# Patient Record
Sex: Female | Born: 1945 | Race: White | Hispanic: No | Marital: Married | Smoking: Former smoker
Health system: Southern US, Community
[De-identification: ages and names within clinical notes are randomized; demographics above are authoritative.]

## PROBLEM LIST (undated history)

## (undated) DIAGNOSIS — R011 Cardiac murmur, unspecified: Secondary | ICD-10-CM

## (undated) DIAGNOSIS — Z7289 Other problems related to lifestyle: Secondary | ICD-10-CM

## (undated) DIAGNOSIS — F419 Anxiety disorder, unspecified: Secondary | ICD-10-CM

## (undated) DIAGNOSIS — E785 Hyperlipidemia, unspecified: Secondary | ICD-10-CM

## (undated) DIAGNOSIS — R42 Dizziness and giddiness: Secondary | ICD-10-CM

## (undated) DIAGNOSIS — I480 Paroxysmal atrial fibrillation: Secondary | ICD-10-CM

## (undated) DIAGNOSIS — Z9289 Personal history of other medical treatment: Secondary | ICD-10-CM

## (undated) DIAGNOSIS — I10 Essential (primary) hypertension: Secondary | ICD-10-CM

## (undated) DIAGNOSIS — F1090 Alcohol use, unspecified, uncomplicated: Secondary | ICD-10-CM

## (undated) HISTORY — PX: TENDON REPAIR: SHX5111

## (undated) HISTORY — DX: Other problems related to lifestyle: Z72.89

## (undated) HISTORY — DX: Paroxysmal atrial fibrillation: I48.0

## (undated) HISTORY — DX: Personal history of other medical treatment: Z92.89

## (undated) HISTORY — PX: BREAST EXCISIONAL BIOPSY: SUR124

## (undated) HISTORY — PX: CYSTOSCOPY: SUR368

## (undated) HISTORY — DX: Alcohol use, unspecified, uncomplicated: F10.90

## (undated) HISTORY — PX: OTHER SURGICAL HISTORY: SHX169

## (undated) HISTORY — PX: ABDOMINAL HYSTERECTOMY: SHX81

---

## 1998-03-16 ENCOUNTER — Emergency Department (HOSPITAL_COMMUNITY): Admission: EM | Admit: 1998-03-16 | Discharge: 1998-03-16 | Payer: Self-pay | Admitting: Internal Medicine

## 1998-06-09 ENCOUNTER — Ambulatory Visit (HOSPITAL_COMMUNITY): Admission: RE | Admit: 1998-06-09 | Discharge: 1998-06-09 | Payer: Self-pay | Admitting: *Deleted

## 1998-08-05 ENCOUNTER — Ambulatory Visit (HOSPITAL_COMMUNITY): Admission: RE | Admit: 1998-08-05 | Discharge: 1998-08-05 | Payer: Self-pay | Admitting: *Deleted

## 1999-04-24 ENCOUNTER — Ambulatory Visit (HOSPITAL_COMMUNITY): Admission: RE | Admit: 1999-04-24 | Discharge: 1999-04-24 | Payer: Self-pay | Admitting: General Surgery

## 1999-04-24 ENCOUNTER — Encounter: Payer: Self-pay | Admitting: General Surgery

## 1999-06-29 ENCOUNTER — Other Ambulatory Visit: Admission: RE | Admit: 1999-06-29 | Discharge: 1999-06-29 | Payer: Self-pay | Admitting: *Deleted

## 2000-02-09 ENCOUNTER — Encounter: Admission: RE | Admit: 2000-02-09 | Discharge: 2000-02-09 | Payer: Self-pay | Admitting: General Surgery

## 2000-02-09 ENCOUNTER — Encounter: Payer: Self-pay | Admitting: General Surgery

## 2000-04-04 ENCOUNTER — Emergency Department (HOSPITAL_COMMUNITY): Admission: EM | Admit: 2000-04-04 | Discharge: 2000-04-04 | Payer: Self-pay | Admitting: Emergency Medicine

## 2000-04-04 ENCOUNTER — Encounter: Payer: Self-pay | Admitting: Internal Medicine

## 2000-07-01 ENCOUNTER — Other Ambulatory Visit: Admission: RE | Admit: 2000-07-01 | Discharge: 2000-07-01 | Payer: Self-pay | Admitting: *Deleted

## 2000-07-09 ENCOUNTER — Encounter: Admission: RE | Admit: 2000-07-09 | Discharge: 2000-07-09 | Payer: Self-pay | Admitting: *Deleted

## 2000-07-09 ENCOUNTER — Encounter: Payer: Self-pay | Admitting: *Deleted

## 2000-12-18 ENCOUNTER — Emergency Department (HOSPITAL_COMMUNITY): Admission: EM | Admit: 2000-12-18 | Discharge: 2000-12-18 | Payer: Self-pay | Admitting: Emergency Medicine

## 2000-12-30 ENCOUNTER — Encounter: Payer: Self-pay | Admitting: General Surgery

## 2000-12-30 ENCOUNTER — Ambulatory Visit (HOSPITAL_COMMUNITY): Admission: RE | Admit: 2000-12-30 | Discharge: 2000-12-30 | Payer: Self-pay | Admitting: General Surgery

## 2001-01-13 ENCOUNTER — Ambulatory Visit (HOSPITAL_COMMUNITY): Admission: RE | Admit: 2001-01-13 | Discharge: 2001-01-13 | Payer: Self-pay | Admitting: Neurosurgery

## 2001-01-13 ENCOUNTER — Encounter: Payer: Self-pay | Admitting: Neurosurgery

## 2001-02-12 ENCOUNTER — Encounter: Payer: Self-pay | Admitting: General Surgery

## 2001-02-12 ENCOUNTER — Ambulatory Visit (HOSPITAL_COMMUNITY): Admission: RE | Admit: 2001-02-12 | Discharge: 2001-02-12 | Payer: Self-pay | Admitting: General Surgery

## 2001-03-31 ENCOUNTER — Encounter (INDEPENDENT_AMBULATORY_CARE_PROVIDER_SITE_OTHER): Payer: Self-pay | Admitting: Specialist

## 2001-03-31 ENCOUNTER — Ambulatory Visit (HOSPITAL_COMMUNITY): Admission: RE | Admit: 2001-03-31 | Discharge: 2001-03-31 | Payer: Self-pay | Admitting: *Deleted

## 2001-06-11 ENCOUNTER — Encounter: Admission: RE | Admit: 2001-06-11 | Discharge: 2001-06-11 | Payer: Self-pay | Admitting: General Surgery

## 2001-06-11 ENCOUNTER — Encounter: Payer: Self-pay | Admitting: General Surgery

## 2001-06-30 ENCOUNTER — Other Ambulatory Visit: Admission: RE | Admit: 2001-06-30 | Discharge: 2001-06-30 | Payer: Self-pay | Admitting: *Deleted

## 2001-08-22 ENCOUNTER — Ambulatory Visit (HOSPITAL_COMMUNITY): Admission: RE | Admit: 2001-08-22 | Discharge: 2001-08-22 | Payer: Self-pay | Admitting: *Deleted

## 2002-06-22 ENCOUNTER — Other Ambulatory Visit: Admission: RE | Admit: 2002-06-22 | Discharge: 2002-06-22 | Payer: Self-pay | Admitting: *Deleted

## 2002-06-30 ENCOUNTER — Encounter: Admission: RE | Admit: 2002-06-30 | Discharge: 2002-06-30 | Payer: Self-pay | Admitting: General Surgery

## 2002-06-30 ENCOUNTER — Encounter: Payer: Self-pay | Admitting: General Surgery

## 2003-05-17 ENCOUNTER — Emergency Department (HOSPITAL_COMMUNITY): Admission: EM | Admit: 2003-05-17 | Discharge: 2003-05-17 | Payer: Self-pay | Admitting: Emergency Medicine

## 2003-05-17 ENCOUNTER — Encounter: Payer: Self-pay | Admitting: Emergency Medicine

## 2003-06-25 ENCOUNTER — Ambulatory Visit (HOSPITAL_COMMUNITY): Admission: RE | Admit: 2003-06-25 | Discharge: 2003-06-25 | Payer: Self-pay | Admitting: Urology

## 2003-07-01 ENCOUNTER — Ambulatory Visit (HOSPITAL_COMMUNITY): Admission: RE | Admit: 2003-07-01 | Discharge: 2003-07-01 | Payer: Self-pay | Admitting: Urology

## 2003-07-01 ENCOUNTER — Encounter: Payer: Self-pay | Admitting: Urology

## 2003-07-06 ENCOUNTER — Other Ambulatory Visit: Admission: RE | Admit: 2003-07-06 | Discharge: 2003-07-06 | Payer: Self-pay | Admitting: *Deleted

## 2003-07-23 ENCOUNTER — Encounter: Admission: RE | Admit: 2003-07-23 | Discharge: 2003-07-23 | Payer: Self-pay | Admitting: *Deleted

## 2003-07-23 ENCOUNTER — Encounter: Payer: Self-pay | Admitting: *Deleted

## 2003-09-25 IMAGING — CT CT PELVIS W/ CM
1 of 4 series · 14 of 32 positions shown, 19 images · IV contrast (omnipaque)
Comparison: none

FINDINGS
CLINICAL DATA: PELVIC PAIN.
CT SCAN OF THE ABDOMEN AND PELVIS WITH CONTRAST
CT SCAN OF THE ABDOMEN WITH CONTRAST:
MULTIPLE SPIRAL IMAGES WERE MADE THROUGH THE ABDOMEN AFTER INTRAVENOUS INJECTION OF 150 CC OF
OMNIPAQUE 300.  ORAL CONTRAST WAS ALSO USED.
THE LUNG BASES ARE NORMAL.  THERE IS NO ABNORMALITY OF THE LIVER, SPLEEN, OR PANCREAS.  THE KIDNEYS
AND RETROPERITONEAL STRUCTURES ARE NORMAL.  THE APPENDIX IS NORMAL.
IMPRESSION
NORMAL CT SCAN OF THE ABDOMEN WITH CONTRAST.
CT SCAN OF THE PELVIS WITH CONTRAST:
ADDITIONAL IMAGES THROUGH THE PELVIS AFTER ORAL AND INTRAVENOUS CONTRAST DEMONSTRATE NO OVARIAN
MASS.  THE UTERUS IS SLIGHTLY ENLARGED.  THERE IS NO INFLAMMATORY CHANGE OR ADENOPATHY.
NEGATIVE CT SCAN OF THE PELVIS WITH THE EXCEPTION OF A PROMINENT UTERUS.

[Series 2: abd/pelvis 5.0 b30f · axial · 0.59mm/px · z∈[-518,-148]mm · 14 of 84 slices shown, 19 images]
[im 5/84  soft-tissue]
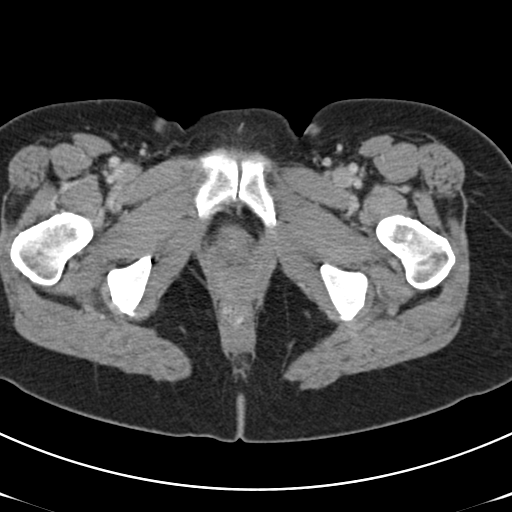
[im 5/84  bone]
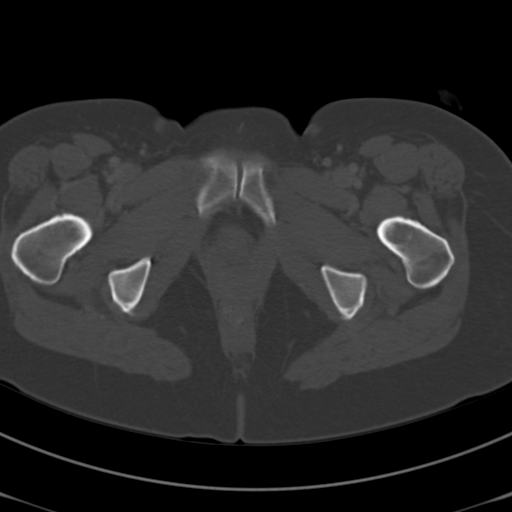
[im 10/84  soft-tissue]
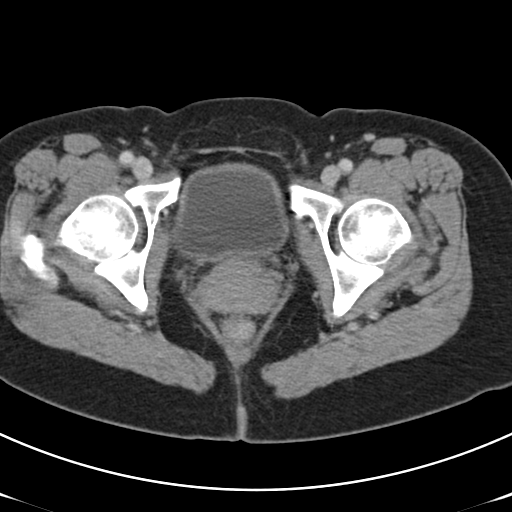
[im 19/84  soft-tissue]
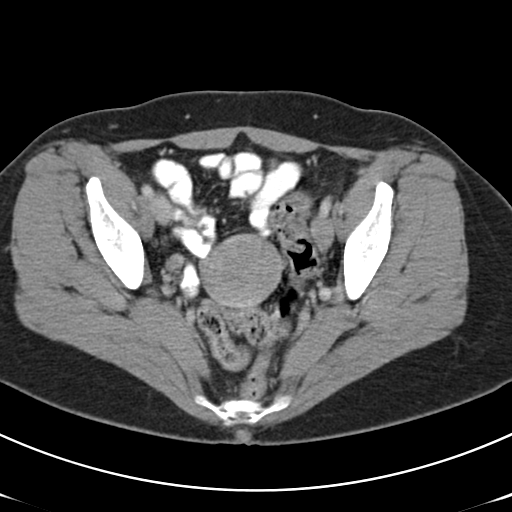
[im 24/84  soft-tissue]
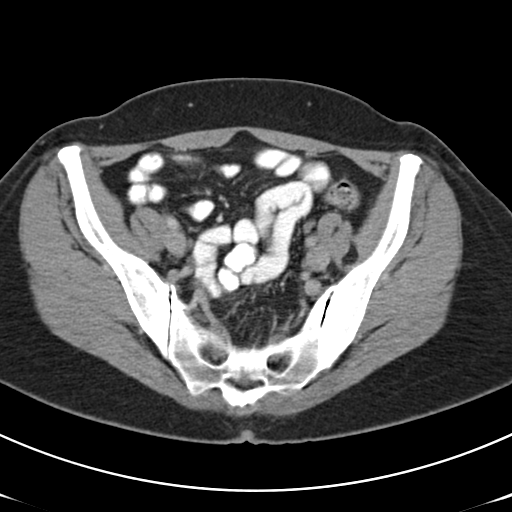
[im 28/84  soft-tissue]
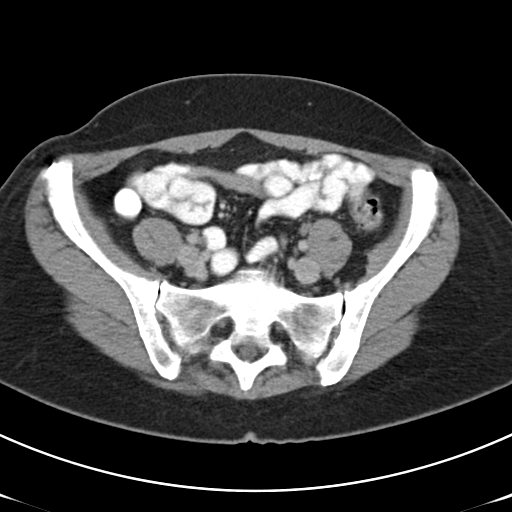
[im 37/84  soft-tissue]
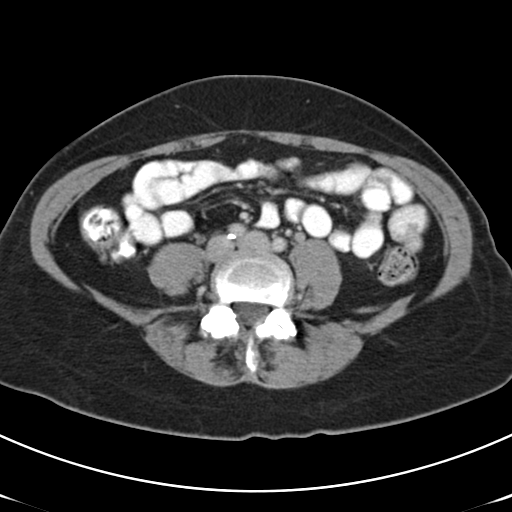
[im 42/84  soft-tissue]
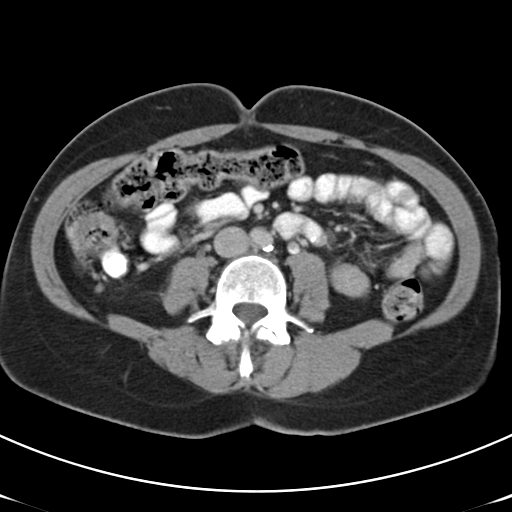
[im 47/84  soft-tissue]
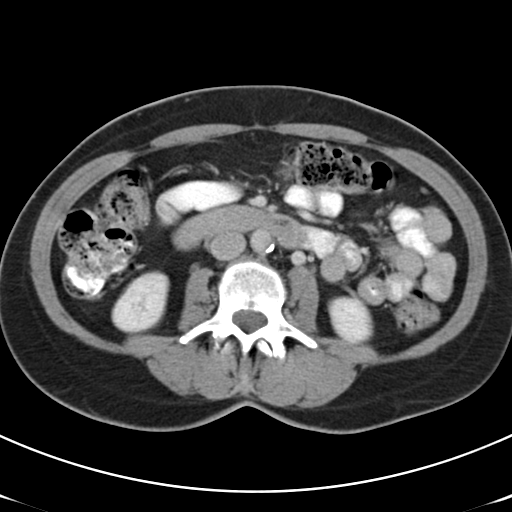
[im 56/84  soft-tissue]
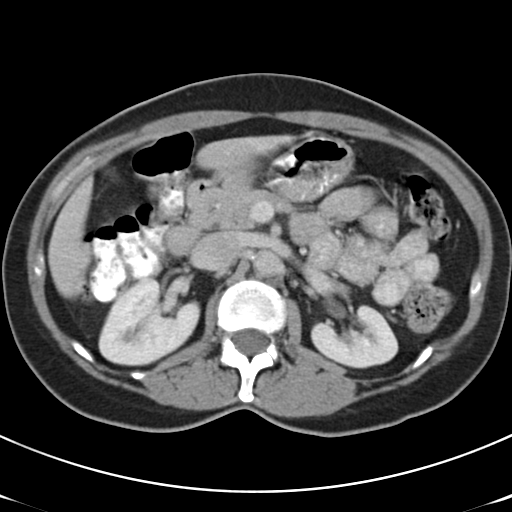
[im 56/84  bone]
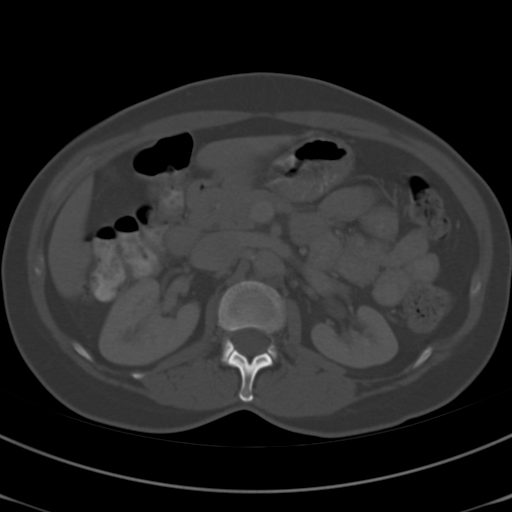
[im 60/84  soft-tissue]
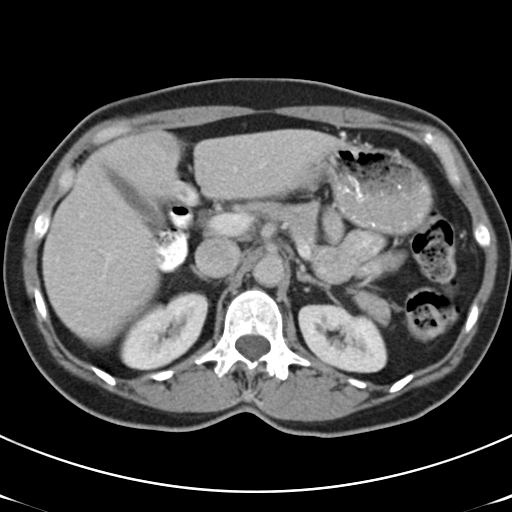
[im 65/84  soft-tissue]
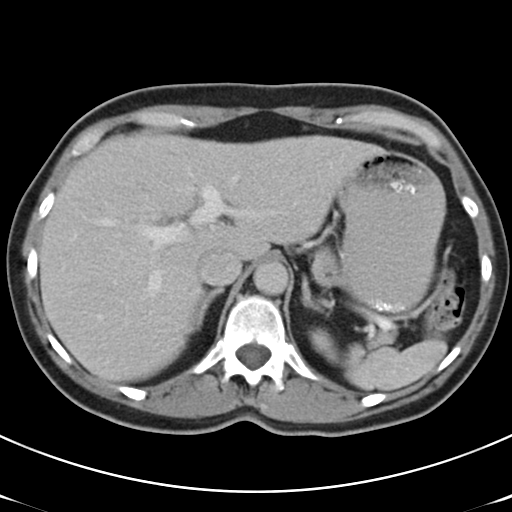
[im 65/84  lung]
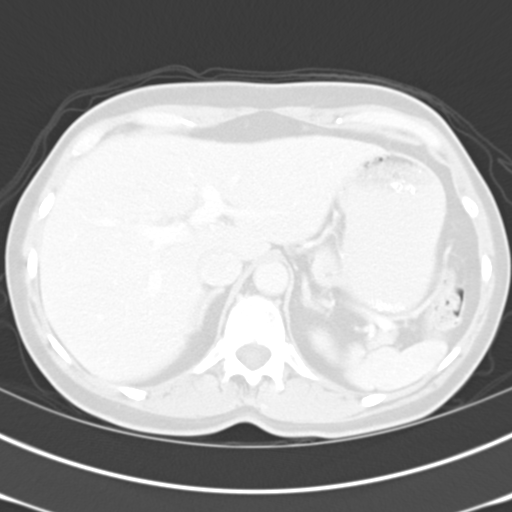
[im 70/84  lung]
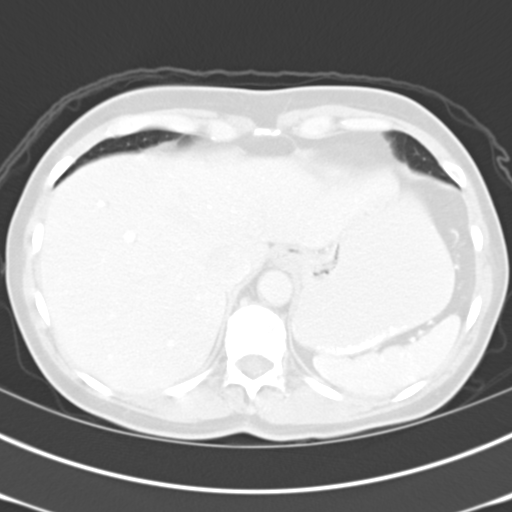
[im 74/84  soft-tissue]
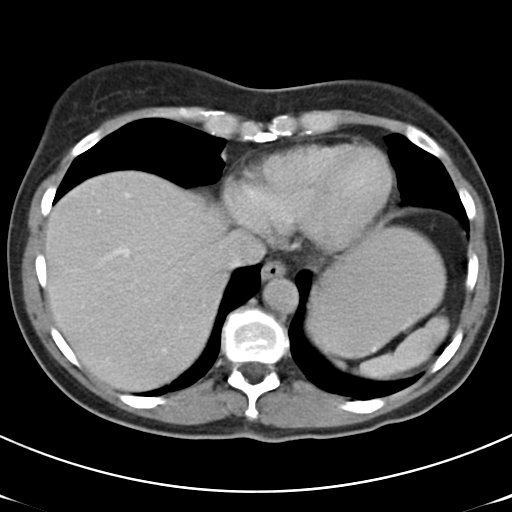
[im 74/84  lung]
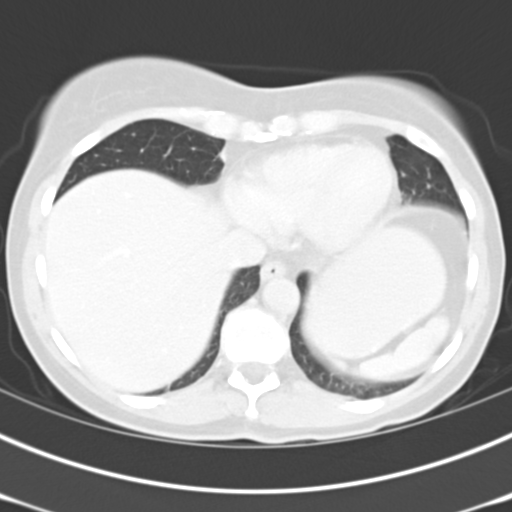
[im 79/84  soft-tissue]
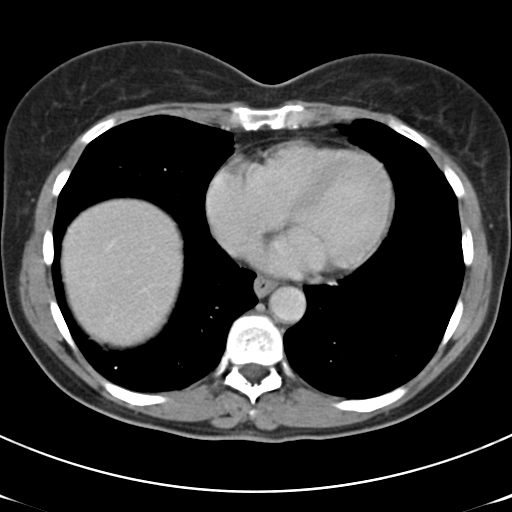
[im 79/84  lung]
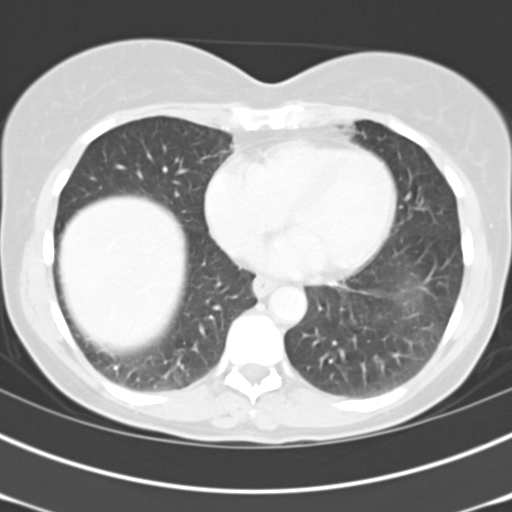

[14 of 32 positions shown; findings below may reference images not displayed]

## 2004-07-24 ENCOUNTER — Encounter: Admission: RE | Admit: 2004-07-24 | Discharge: 2004-07-24 | Payer: Self-pay | Admitting: *Deleted

## 2004-07-26 ENCOUNTER — Other Ambulatory Visit: Admission: RE | Admit: 2004-07-26 | Discharge: 2004-07-26 | Payer: Self-pay | Admitting: *Deleted

## 2004-10-16 ENCOUNTER — Ambulatory Visit (HOSPITAL_COMMUNITY): Admission: RE | Admit: 2004-10-16 | Discharge: 2004-10-16 | Payer: Self-pay | Admitting: Gastroenterology

## 2005-01-15 ENCOUNTER — Ambulatory Visit (HOSPITAL_COMMUNITY): Admission: RE | Admit: 2005-01-15 | Discharge: 2005-01-15 | Payer: Self-pay | Admitting: *Deleted

## 2005-01-31 ENCOUNTER — Encounter (INDEPENDENT_AMBULATORY_CARE_PROVIDER_SITE_OTHER): Payer: Self-pay | Admitting: Specialist

## 2005-01-31 ENCOUNTER — Observation Stay (HOSPITAL_COMMUNITY): Admission: RE | Admit: 2005-01-31 | Discharge: 2005-02-01 | Payer: Self-pay | Admitting: *Deleted

## 2005-07-25 ENCOUNTER — Encounter: Admission: RE | Admit: 2005-07-25 | Discharge: 2005-07-25 | Payer: Self-pay | Admitting: *Deleted

## 2005-07-30 ENCOUNTER — Other Ambulatory Visit: Admission: RE | Admit: 2005-07-30 | Discharge: 2005-07-30 | Payer: Self-pay | Admitting: *Deleted

## 2006-07-26 ENCOUNTER — Encounter: Admission: RE | Admit: 2006-07-26 | Discharge: 2006-07-26 | Payer: Self-pay | Admitting: *Deleted

## 2006-07-31 ENCOUNTER — Other Ambulatory Visit: Admission: RE | Admit: 2006-07-31 | Discharge: 2006-07-31 | Payer: Self-pay | Admitting: *Deleted

## 2006-08-22 ENCOUNTER — Encounter: Admission: RE | Admit: 2006-08-22 | Discharge: 2006-08-22 | Payer: Self-pay | Admitting: Family Medicine

## 2006-09-30 ENCOUNTER — Ambulatory Visit (HOSPITAL_COMMUNITY): Admission: RE | Admit: 2006-09-30 | Discharge: 2006-09-30 | Payer: Self-pay | Admitting: Urology

## 2006-10-20 IMAGING — MG MM SCREEN MAMMOGRAM BILATERAL
4 series · 4 of 4 positions shown · non-contrast
Comparison: none

DG SCREEN MAMMOGRAM BILATERAL
Bilateral CC and MLO view(s) were taken.

DIGITAL SCREENING MAMMOGRAM WITH CAD:
There is a fibroglandular pattern.  No masses or malignant type calcifications are identified.  
Compared with prior studies.

[R CC]
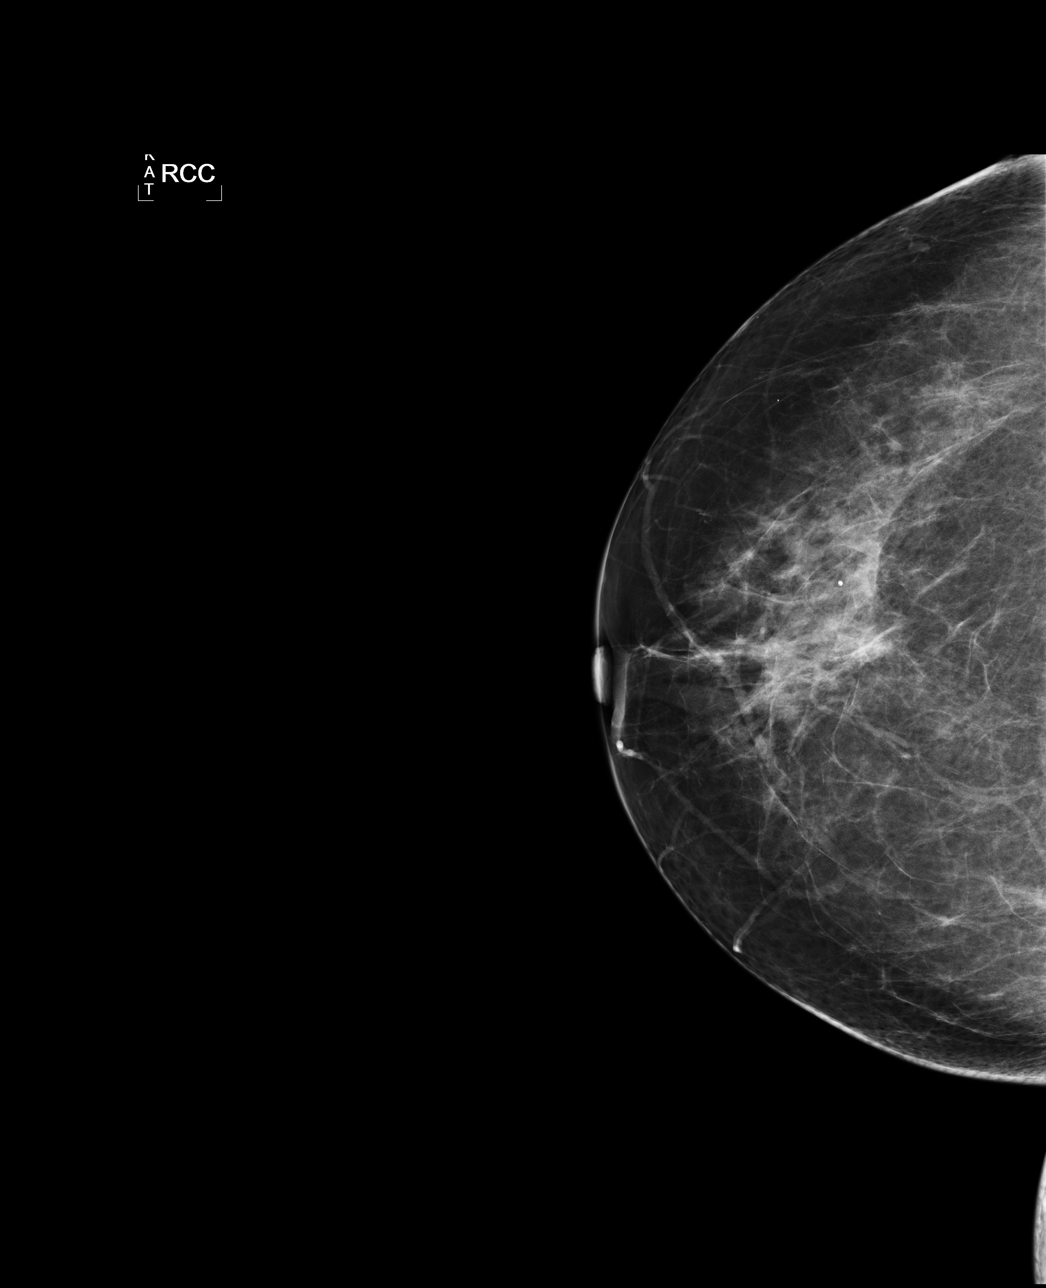

[L CC]
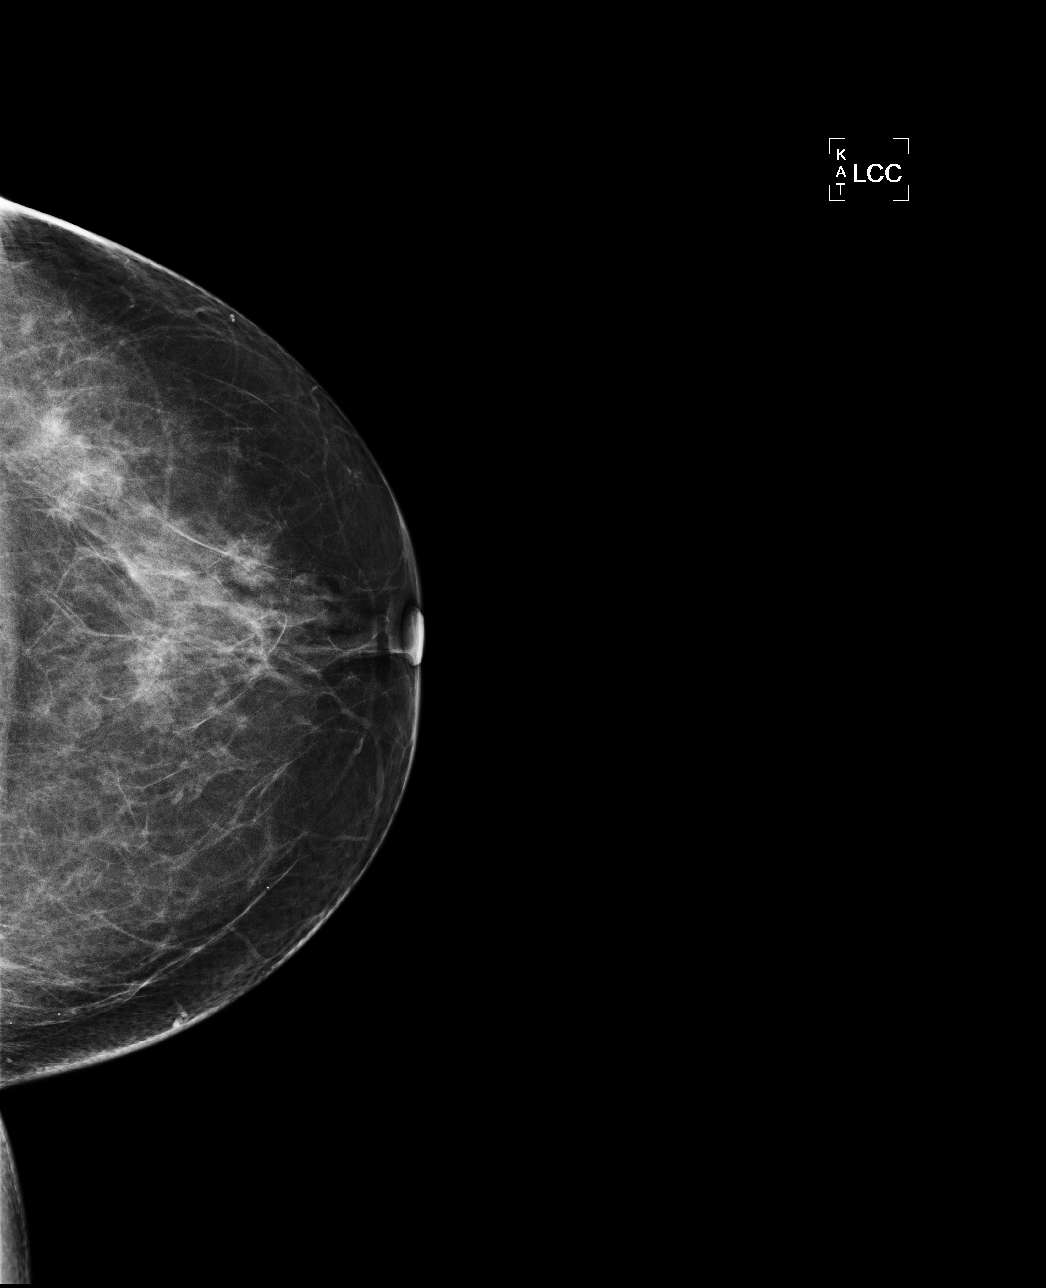

[L MLO]
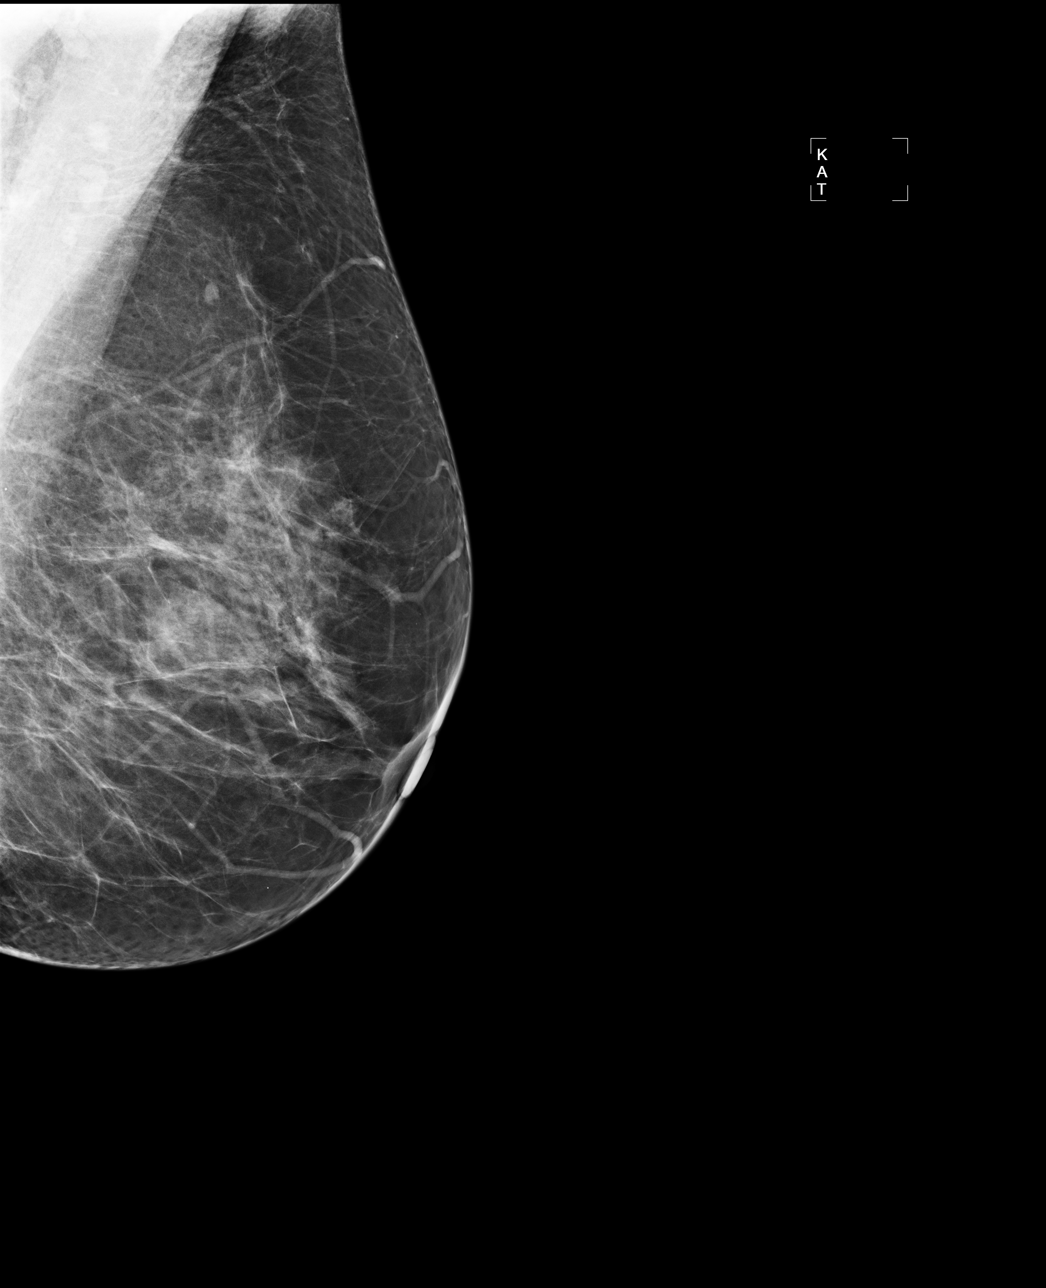

[R MLO]
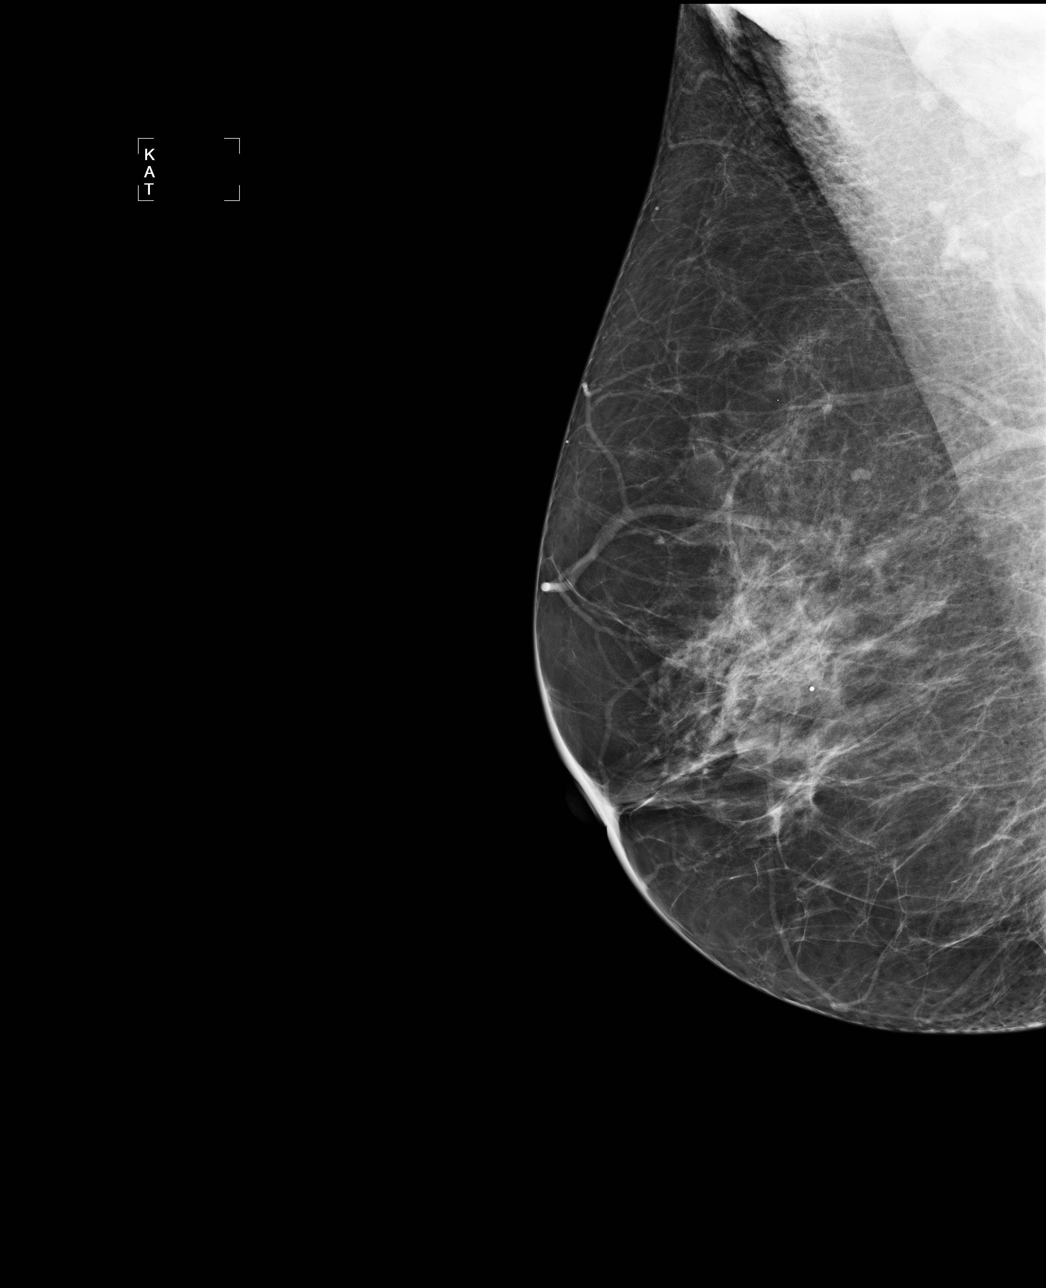

[4 of 4 positions shown; findings below may reference images not displayed]

IMPRESSION: No specific mammographic evidence of malignancy.  Next screening mammogram is recommended in one 
year.

ASSESSMENT: Negative - BI-RADS 1

Screening mammogram in 1 year.
ANALYZED BY COMPUTER AIDED DETECTION. , THIS PROCEDURE WAS A DIGITAL MAMMOGRAM.

## 2007-07-21 ENCOUNTER — Other Ambulatory Visit: Admission: RE | Admit: 2007-07-21 | Discharge: 2007-07-21 | Payer: Self-pay | Admitting: *Deleted

## 2007-08-08 ENCOUNTER — Encounter: Admission: RE | Admit: 2007-08-08 | Discharge: 2007-08-08 | Payer: Self-pay | Admitting: *Deleted

## 2008-08-09 ENCOUNTER — Encounter: Admission: RE | Admit: 2008-08-09 | Discharge: 2008-08-09 | Payer: Self-pay | Admitting: Family Medicine

## 2008-08-23 ENCOUNTER — Encounter: Admission: RE | Admit: 2008-08-23 | Discharge: 2008-08-23 | Payer: Self-pay | Admitting: Family Medicine

## 2009-08-16 ENCOUNTER — Encounter: Admission: RE | Admit: 2009-08-16 | Discharge: 2009-08-16 | Payer: Self-pay | Admitting: Family Medicine

## 2009-08-23 ENCOUNTER — Encounter: Admission: RE | Admit: 2009-08-23 | Discharge: 2009-08-23 | Payer: Self-pay | Admitting: Family Medicine

## 2010-03-31 ENCOUNTER — Encounter (INDEPENDENT_AMBULATORY_CARE_PROVIDER_SITE_OTHER): Payer: Self-pay | Admitting: Internal Medicine

## 2010-03-31 ENCOUNTER — Observation Stay (HOSPITAL_COMMUNITY): Admission: EM | Admit: 2010-03-31 | Discharge: 2010-04-02 | Payer: Self-pay | Admitting: Emergency Medicine

## 2010-03-31 ENCOUNTER — Ambulatory Visit: Payer: Self-pay | Admitting: Internal Medicine

## 2010-04-01 ENCOUNTER — Encounter (INDEPENDENT_AMBULATORY_CARE_PROVIDER_SITE_OTHER): Payer: Self-pay | Admitting: Internal Medicine

## 2010-04-06 ENCOUNTER — Encounter: Admission: RE | Admit: 2010-04-06 | Discharge: 2010-07-04 | Payer: Self-pay | Admitting: Family Medicine

## 2010-10-05 ENCOUNTER — Encounter
Admission: RE | Admit: 2010-10-05 | Discharge: 2010-10-05 | Payer: Self-pay | Source: Home / Self Care | Attending: Family Medicine | Admitting: Family Medicine

## 2010-10-29 ENCOUNTER — Encounter: Payer: Self-pay | Admitting: Family Medicine

## 2010-12-24 LAB — URINALYSIS, ROUTINE W REFLEX MICROSCOPIC
Ketones, ur: NEGATIVE mg/dL
Nitrite: NEGATIVE
Protein, ur: NEGATIVE mg/dL
Urobilinogen, UA: 1 mg/dL (ref 0.0–1.0)

## 2010-12-24 LAB — CBC
HCT: 39.9 % (ref 36.0–46.0)
Hemoglobin: 13.1 g/dL (ref 12.0–15.0)
MCH: 31.4 pg (ref 26.0–34.0)
MCHC: 32.8 g/dL (ref 30.0–36.0)
MCV: 93.8 fL (ref 78.0–100.0)
MCV: 94.4 fL (ref 78.0–100.0)
Platelets: 212 10*3/uL (ref 150–400)
RBC: 3.99 MIL/uL (ref 3.87–5.11)
WBC: 5.4 10*3/uL (ref 4.0–10.5)

## 2010-12-24 LAB — COMPREHENSIVE METABOLIC PANEL
ALT: 36 U/L — ABNORMAL HIGH (ref 0–35)
Albumin: 3.7 g/dL (ref 3.5–5.2)
Alkaline Phosphatase: 69 U/L (ref 39–117)
BUN: 20 mg/dL (ref 6–23)
BUN: 21 mg/dL (ref 6–23)
CO2: 25 mEq/L (ref 19–32)
CO2: 27 mEq/L (ref 19–32)
Chloride: 109 mEq/L (ref 96–112)
Creatinine, Ser: 0.78 mg/dL (ref 0.4–1.2)
GFR calc non Af Amer: >60 mL/min (ref >60)
GFR calc non Af Amer: >60 mL/min (ref >60)
Glucose, Bld: 120 mg/dL — ABNORMAL HIGH (ref 70–99)
Potassium: 4.2 mEq/L (ref 3.5–5.1)
Sodium: 139 mEq/L (ref 135–145)
Total Bilirubin: 1.2 mg/dL (ref 0.3–1.2)
Total Bilirubin: 1.5 mg/dL — ABNORMAL HIGH (ref 0.3–1.2)

## 2010-12-24 LAB — GLUCOSE, CAPILLARY
Glucose-Capillary: 104 mg/dL — ABNORMAL HIGH (ref 70–99)
Glucose-Capillary: 106 mg/dL — ABNORMAL HIGH (ref 70–99)

## 2010-12-24 LAB — APTT: aPTT: 34 seconds (ref 24–37)

## 2010-12-24 LAB — URINALYSIS, MICROSCOPIC ONLY
Bilirubin Urine: NEGATIVE
Glucose, UA: NEGATIVE mg/dL
Hgb urine dipstick: NEGATIVE
Nitrite: NEGATIVE
Specific Gravity, Urine: 1.014 (ref 1.005–1.030)
pH: 7 (ref 5.0–8.0)

## 2010-12-24 LAB — CARDIAC PANEL(CRET KIN+CKTOT+MB+TROPI): Relative Index: INVALID (ref 0.0–2.5)

## 2010-12-24 LAB — POCT CARDIAC MARKERS: Troponin i, poc: <0.05 ng/mL (ref 0.00–0.09)

## 2010-12-24 LAB — PROTIME-INR: INR: 0.98 (ref 0.00–1.49)

## 2010-12-24 LAB — LIPID PANEL
Cholesterol: 172 mg/dL (ref 0–200)
LDL Cholesterol: 81 mg/dL (ref 0–99)
VLDL: 23 mg/dL (ref 0–40)

## 2011-02-23 NOTE — Op Note (Signed)
Sheila Bruce, Sheila Bruce                ACCOUNT NO.:  [id number]   MEDICAL RECORD NO.:  [id number]          PATIENT TYPE:  AMB   LOCATION:  ENDO                         FACILITY:  [facility]   PHYSICIAN:  John C. Madilyn Fireman, M.D.    DATE OF BIRTH:  [birth year]   DATE OF PROCEDURE:  10/16/2004  DATE OF DISCHARGE:                                 OPERATIVE REPORT   PROCEDURE:  Colonoscopy.   INDICATION FOR PROCEDURE:  Average-risk colon cancer screening.   PROCEDURE:  The patient was placed in the left lateral decubitus position  and placed on the pulse monitor with continuous low-flow oxygen delivered by  nasal cannula.  She was sedated with 75 mcg IV fentanyl and 7 mg IV Versed.  The Olympus video colonoscope was inserted into the rectum and advanced to  the cecum, confirmed by transillumination of McBurney's point and  visualization of ileocecal valve and appendiceal orifice.  The prep was  excellent.  The cecum, ascending, transverse, descending and sigmoid colon  all appeared normal with no masses, polyps, diverticula or other mucosal  abnormalities.  The rectum likewise appeared normal, and retroflexed view of the anus  revealed no obvious internal hemorrhoids.  The scope was then withdrawn and  the patient returned to the recovery room in stable condition.  She  tolerated the procedure well, and there were no immediate complications.   IMPRESSION:  1.  Normal colonoscopy.   PLAN:  Next colonoscopy in 10 years with consideration of colon screening by  sigmoidoscopy or Hemoccults in 5 years.      JCH/MEDQ  D:  10/16/2004  T:  10/16/2004  Job:  [id number]   cc:   Delano Metz  CB# [phone] [address]  [place]  [place] [address]  Fax: [phone]

## 2011-02-23 NOTE — Op Note (Signed)
[facility]  Patient:    Sheila Bruce, Sheila Bruce                       MRN: [id number] Proc. Date: 03/31/01 Adm. Date:  [id number] Attending:  Collene Schlichter                           Operative Report  PREOPERATIVE DIAGNOSES:  Abnormal uterine bleeding, pelvic pain, uterine enlargement, persisting pelvic cyst.  POSTOPERATIVE DIAGNOSES:  Abnormal uterine bleeding, pelvic pain, uterine enlargement, persisting pelvic cyst, pelvic adhesions, probable endometriosis. Pathology pending.  OPERATION PERFORMED:  Diagnostic hysteroscopy, fractional D&C, laparoscopy with excision of right tubal cyst, lysis of adhesions, oblation of endometriosis.  ANESTHESIA:  General oral tracheal.  SURGEON:  Dr. Randell Patient.  INDICATIONS FOR PROCEDURE:  The patients a 65 year old with the above noted problems who was counselled as to the need for surgery to evaluate and treat these problems. She was fully counselled as to the nature of the procedure and the risks involved to include risks of anesthesia, injury to uterus, tubes, ovaries, bowel, bladder, blood vessels, postoperative hemorrhage, infection, and recuperation. She fully understands all these considerations and wishes to proceed on March 31, 2001.  INDICATIONS FOR OUTPATIENT PROCEDURE:  This procedure is commonly performed as an outpatient procedure.  OPERATIVE FINDINGS:  On hysteroscopy, the uterus sounded to approximately 10 cm. The endocervical canal was clean. Endometrial cavity had a moderate amount of shaggy appearing endometrium with polypoid area at the upper fundal surface. The submucosal area was somewhat irregular suggestive of small submucosal myomata. A laparoscopy of the liver, gallbladder, spleen, area of the appendix were normal to visualization. In the pelvis, the uterus was mid posterior, approximately 8-[redacted] weeks gestational size and soft. There was an adhesions involving the posterior fundal area to the  loop of small intestine which was below this area.  The right tube was previously surgically divided and had an approximately 3 cm cyst at the distal end with another small cyst present. The cyst contained gray-green fluid. The ovary on the right was small and appeared to be only minimally functional if at all. On the left side, the tube also had been surgically interrupted for sterilization attempt and the ovary was also small and appeared nonfunctional. There were areas of powder burn formation and abnormal discoloration in the posterior cul-de-sac particularly along the right uterosacral ligament.  DESCRIPTION OF PROCEDURE:  With the patient under general anesthesia, prepped and draped in the usual sterile fashion, with speculum in the vagina and tenaculum on the anterior lip of the cervix, a small sharp curette was used for curettage of the endocervical canal. The uterus was then sounded as noted above, and the cervix was dilated up to a #21 Pratt dilator. Medium sharp curette and polyp forceps were used to remove tissue from the endometrial cavity. When no further tissue was removed, the hysteroscope was reemployed to ensure that all tissue had been excised, and after noting that this was the case and that hemostasis was maintained, an acorn cannula was placed on the cervix. The patient was then catheterized with free flow of clear urine. She was then prepared and draped for a laparoscopy procedure.  An incision was then made in the lower pole of the umbilicus with insertion of the Veress cannula and installation of three liters of carbon dioxide. The reusable 10 mm trocar for the operative scope and the scope itself were  then inserted into the peritoneal cavity. A reusable 5 mm probe was inserted through a stab wound just above the symphysis pubis. The above noted findings were visualized. Bipolar electrocoagulation was used to render the area around the cyst hemostatic with  excision of the cyst. Prior to full removal of the cyst, the fluid was aspirated with no leakage into the peritoneal cavity. The cyst and distal portion of the right tube were then excised. The adhesions on the posterior surface of the uterus was rendered hemostatic with bipolar electrocoagulation and likewise excised. Small areas of endometriosis on the posterior fundus were destroyed using bipolar electrocoagulation. The area was then lavaged with copious amounts of lactated Ringers solution, and after noting that hemostasis was maintained and that sponge and instrument counts were correct following a reduction of intra-abdominal pressure by allowing gas to escape, the instruments were removed from the peritoneal cavity. Gas was allowed to fully escaped, and the incisions were closed with fascial sutures of #0 Vicryl and subcuticular sutures of 3-0 plain catgut. Estimated blood loss less than 25 ml. The patient was taken to the recovery room in good condition follow catheterization and free flow of clear urine.  FOLLOW-UP CARE:  She will be discharged from recovery with stable vital signs and is to return to the office in approximately two weeks for follow-up. She was instructed to gradually progress her activities over several days and to avoid vaginal penetration for two weeks. She is to call for heavy bleeding, pain or unexplained fever and will be placed on a regular diet. She will be discharged in good condition. She was given a prescription for Darvocet-N 100 #15 to be taken 1 or 2 q. 4h p.r.n. pain and ampicillin 500 mg #16 to be taken 1 q.i.d. as continuation for her mitral valve prolapse prophylaxis. DD:  03/31/01 TD:  03/31/01 Job: [id number]

## 2011-02-23 NOTE — Discharge Summary (Signed)
NAMEBOWEN, GOYAL                ACCOUNT NO.:  [id number]   MEDICAL RECORD NO.:  [id number]          PATIENT TYPE:  OBV   LOCATION:  0440                         FACILITY:  [facility]   PHYSICIAN:  Almedia Balls. Fore, M.D.   DATE OF BIRTH:  [birth year]   DATE OF ADMISSION:  01/31/2005  DATE OF DISCHARGE:  02/01/2005                                 DISCHARGE SUMMARY   HISTORY:  The patient is a 65 year old with abnormal uterine bleeding  postmenopausally, uterine enlargement, pelvic pain, stress urinary  incontinence, and rectocele for surgery on January 31, 2005. The remainder of  her history and physical are as previously dictated.   LABORATORY DATA:  Include preoperative hemoglobin 13 g. Chest x-ray showed  minimal COPD. Electrocardiogram was within normal limits.   HOSPITAL COURSE:  The patient was taken to the operating room on April 26 at  which time TAH/BSO, Moschowitz enterocele repair, Burch cystourethropexy,  and posterior colporrhaphy were performed. The patient's diet and ambulation  were progressed over the evening of April 26 and the morning of April 27.  She did well postoperatively. On the morning of April 27 she was afebrile  and was instructed in the care of her suprapubic catheter. It was felt that  she could be discharged with this in place to care for it at home and to  return when necessary.   FINAL DIAGNOSES:  1.  Abnormal uterine bleeding.  2.  Uterine enlargement.  3.  Pelvic pain.  4.  Stress urinary incontinence.  5.  Rectocele.   OPERATION:  TAH/BSO, Moschowitz enterocele repair, Burch cystourethropexy,  posterior colporrhaphy, placement of a suprapubic catheter. Pathology report  unavailable at the time of dictation.   DISPOSITION:  Discharged home to return to the office for catheter removal  when voiding with residual less than 60 mL x2 during the day. She is  otherwise to return in 2 weeks for follow-up. She is instructed to gradually  progress her  activities over several weeks at home and to limit lifting and  driving for 2 weeks. She was fully ambulatory, on a regular diet, and in  good condition at the time of discharge. She was given a prescription for  Dilaudid generic 2 mg #30 to be taken one to two q.4-6h. p.r.n. pain and  Keflex 250 mg capsules #24 to be taken one q.i.d.      SRF/MEDQ  D:  02/01/2005  T:  02/01/2005  Job:  [id number]

## 2011-02-23 NOTE — H&P (Signed)
Sheila Bruce, Sheila Bruce                ACCOUNT NO.:  [id number]   MEDICAL RECORD NO.:  [id number]           PATIENT TYPE:   LOCATION:                                 FACILITY:   PHYSICIAN:  Almedia Balls. Fore, M.D.        DATE OF BIRTH:   DATE OF ADMISSION:  01/31/2005  DATE OF DISCHARGE:                                HISTORY & PHYSICAL   CHIEF COMPLAINT:  Uterine enlargement, pain, abnormal bleeding, stress  incontinence, cystocele, rectocele.   PRESENT ILLNESS:  The patient is a 65 year old who has been followed in our  office over several years.  Sequential CT scans of her abdomen and pelvis  showed enlargement of her uterus with multiple myomata present.  The patient  is having recurring lower abdominal pain felt to be secondary to these  structures, as well as possible adhesions.  She is admitted at this time for  TAH/BSO Burch cystourethropexy posterior repair.  She has been fully  counseled as to the nature of the procedure and the risks involved to  include risks of anesthesia, injury to bowel, bladder, blood vessels,  ureters, postoperative hemorrhage, infection, recuperation, and use of  hormone replacement should her ovaries be removed.  She fully understands  all these considerations and wishes to proceed on January 31, 2005.   PAST MEDICAL HISTORY:  1.  Hysteroscopy D&C and laparoscopy in 2002.  2.  Hysteroscopy D&C in October 2003 with benign tissue being obtained.  3.  The patient underwent endometrial biopsy in early April 2006 with benign      tissue found.   MEDICATIONS:  1.  Flonase for respiratory problems.  2.  Albuterol for respiratory problems.  3.  Uses Vivelle patch.  4.  Prometrium for hormone replacement.   ALLERGIES:  1.  CODEINE.  2.  CIPRO.  3.  NONSTEROIDAL ANTI-INFLAMMATORY MEDICATIONS.   FAMILY HISTORY:  Sister with a bipolar disorder and diabetes.  Paternal  grandmother with breast cancer.  Father with prostate cancer.  A number of  grandparents  and parents with cardiovascular disease.   REVIEW OF SYSTEMS:  HEENT:  Has allergies, probably related to sinus  problems.  CARDIORESPIRATORY:  Negative, except as related to allergies.  GASTROINTESTINAL:  Negative.  GENITOURINARY:  As in present illness.  NEUROMUSCULAR:  Negative.   PHYSICAL EXAMINATION:  VITAL SIGNS:  Height 5 feet 0.5 inch.  Weight 120  pounds.  Blood pressure 122/76, pulse 80, respirations 18.  GENERAL:  Well-developed white female in no acute distress.  HEENT:  Within normal limits.  NECK:  Supple without masses, adenopathy, or bruits.  HEART:  Regular rate and rhythm without murmurs.  LUNGS:  Clear to percussion and auscultation.  BREASTS:  Breasts examined sitting and lying, without mass.  Axilla  negative.  ABDOMEN:  Flat and soft without mass or tenderness.  PELVIC:  External genitalia, Bartholin's urethra, Skene's glands within  normal limits.  Cervix is slightly inflamed.  Uterus is mid position,  approximately 12 plus weeks gestational size, irregular.  Tender to  palpation.  Adnexal exam revealed no  palpable masses, but tenderness  bilaterally.  There is noted to be a moderate cystocele and rectocele with  possible enterocele present, and tenderness in the posterior cul-de-sac.  EXTREMITIES:  Within normal limits.  Central nervous system grossly intact.  SKIN:  Without suspicious lesions.   IMPRESSION:  1.  Uterine enlargement.  2.  Pelvic pain.  3.  Stress urinary incontinence with cystocele, rectocele, and possible      enterocele.   DISPOSITION:  As noted above.   Of note is that a Pap smear was normal in October of 2005.       ___________________________________________  Almedia Balls. Randell Patient, M.D.    SRF/MEDQ  D:  01/18/2005  T:  01/18/2005  Job:  [id number]

## 2011-02-23 NOTE — Op Note (Signed)
NAMELADAVIA, Sheila Bruce                ACCOUNT NO.:  [id number]   MEDICAL RECORD NO.:  [id number]          PATIENT TYPE:  AMB   LOCATION:  DAY                          FACILITY:  [facility]   PHYSICIAN:  Almedia Balls. Fore, M.D.   DATE OF BIRTH:  1946-09-27   DATE OF PROCEDURE:  01/31/2005  DATE OF DISCHARGE:                                 OPERATIVE REPORT   PREOPERATIVE DIAGNOSES:  1.  Uterine enlargement.  2.  Abnormal uterine bleeding.  3.  Pelvic pain.  4.  Stress urinary incontinence.  5.  Rectocele.   POSTOPERATIVE DIAGNOSES:  1.  Uterine enlargement.  2.  Abnormal uterine bleeding.  3.  Pelvic pain.  4.  Stress urinary incontinence.  5.  Rectocele.  6.  Pending pathology.  7.  Enterocele.   OPERATIONS:  1.  Total abdominal hysterectomy.  2.  Bilateral salpingo-oophorectomy.  3.  Moschowitz enterocele repair.  4.  Burch cystourethropexy.  5.  Posterior colporrhaphy.   OPERATOR:  Almedia Balls. Randell Patient, M.D.   FIRST ASSISTANT:  Leona Singleton, M.D.   INDICATION FOR SURGERY:  The patient is a 65 year old with the above-noted  problems, who has been counseled as to the need for surgery to repair these  problems and the type of surgery to be performed.  She has been fully  counseled as to the nature of the procedure and the risks involved to  include risks of anesthesia, injury to bowel, bladder, blood vessels,  ureters, postoperative hemorrhage, infection, recuperation and continuation  of hormone replacement following removal of the ovaries.  She fully  understands all of these considerations and wishes to proceed on January 31, 2005.   OPERATIVE FINDINGS:  On entry into the peritoneal cavity, the uterus was  enlarged to approximately [redacted] weeks gestational size.  Both ovaries appeared  normal.  There was a slight to moderate cystocele and moderate rectocele  present on bimanual exam later.   DESCRIPTION OF PROCEDURE:  With the patient under general anesthesia,  prepared and draped  in the usual sterile fashion in the dorsolithotomy  position and with a Foley catheter in the bladder, a lower abdominal  transverse incision was made and carried into the peritoneal cavity without  difficulty.  Exploration of the upper abdomen revealed all viscera to be  normal.  A self-retaining retractor was placed and the bowel was packed off.  Kelly clamps were used to clamp the utero-ovarian anastomoses, tubes and  round ligaments bilaterally for retraction and hemostasis.  The round  ligaments were then transected using Bovie electrocautery with development  of a bladder flap anteriorly and entry into the retroperitoneal space.  The  bladder was advanced over the lower uterine segment and cervix without  difficulty.  The infundibular pelvic ligaments bilaterally were isolated,  clamped, cut and doubly ligated with one chromic catgut.  The uterine  vessels bilaterally were skeletonized, clamped, cut and suture ligated with  1 chromic catgut.  Cardinal ligaments bilaterally were likewise clamped, cut  and suture ligated with 1 chromic catgut.  It was them possible to excess  the uterus and cervix using Bovie coagulation to dissect the tissue off of  the cervix.  Vaginal cuff angles were then rendered hemostatic with  interrupted sutures of 1 chromic catgut.  The vaginal cuff was  reapproximated and rendered hemostatic with a continuous interlocking suture  of 1 chromic catgut.  The area was lavaged with copious amounts of lactated  Ringer's solution.  After noting that hemostasis was maintained in the  surgical area, a suture of 0 Prolene was placed to close off the pelvis by  incorporating the round ligaments, anterior peritoneum and posterior  peritoneum to prevent and repair enterocele.  With good hemostasis and a  correct sponge and instrument count, the peritoneum was closed with a  continuous suture of 0 Vicryl.   The area at the space of Retzius was then opened up using blunt  dissection.  The surgeon placed another glove on his left hand and inserted this into the  vagina with elevation of the urethra with the Foley catheter delineating the  urethra and a Foley bulb delineating the lower portion of the trigone of the  bladder.  Interrupted sutures were placed through the vaginal mucosa and the  Cooper's ligaments bilaterally for elevation of the paravesical and vaginal  areas.  This effectively reestablished the ureterovesical angle.  These  sutures were then died down.  A suprapubic catheter was then placed just  above the symphysis pubis and placed into the bladder without difficulty.  A  Penrose drain was placed into the space of Retzius and exited through a stab  wound just in the right lower quadrant.  The area was lavaged again.  After  noting that hemostasis was maintained, the fascia was reapproximated with  two sutures of 0 Vicryl which were brought from the lateral aspects of the  incision and tied separately in the midline.  Subcutaneous fat was  reapproximated with interrupted sutures of 0 Vicryl.  The skin was closed  with a subcuticular suture of 3-0 plain catgut.   The patient was then repositioned for posterior colporrhaphy.  Allis clamps  were placed at the mucocutaneous junction at the introitus with another  Allis in the midline on the vaginal mucosa posteriorly.  A solution of 1%  lidocaine with 1:100,000 epinephrine was injected in the midline for a total  of approximately 5 mL.  An incision was made in the midline of the vaginal  mucosa and carried up to approximately the vaginal cuff area.  Underlying  levator ani fascia was dissected free of the vaginal mucosa using sharp and  blunt dissection bilaterally.  The rectocele was reduced and the levator ani  fascia was brought together in the midline with vertical and transverse sutures of 0 Vicryl which were imbricating in nature.  Redundant vaginal  mucosa was then trimmed and the vaginal  mucosa was reapproximated and  rendered hemostatic with a continuous interlocking suture of 2-0 chromic  catgut.  At this point, with good hemostasis and clear urine draining from  the Foley catheter tubing, the procedure was  terminated.  Estimated blood loss 150 mL.  The patient was taken to the  recovery room in good condition with clear urine in the Foley catheter  tubing.  The tubing was attached to the suprapubic catheter, which was  draining well as well.  The patient will be placed on 23-hour observation  following surgery.      SRF/MEDQ  D:  01/31/2005  T:  01/31/2005  Job:  [id number]  cc:   Leona Singleton, M.D.  [address]  [place]  [place] [address]  Fax: [phone]

## 2011-09-05 ENCOUNTER — Other Ambulatory Visit: Payer: Self-pay | Admitting: Family Medicine

## 2011-09-05 DIAGNOSIS — Z1231 Encounter for screening mammogram for malignant neoplasm of breast: Secondary | ICD-10-CM

## 2011-10-03 ENCOUNTER — Ambulatory Visit
Admission: RE | Admit: 2011-10-03 | Discharge: 2011-10-03 | Disposition: A | Discharge status: Home / Self Care | Payer: 59 | Source: Ambulatory Visit | Attending: Family Medicine | Admitting: Family Medicine

## 2011-10-03 DIAGNOSIS — Z1231 Encounter for screening mammogram for malignant neoplasm of breast: Secondary | ICD-10-CM

## 2011-10-08 ENCOUNTER — Ambulatory Visit: Payer: Self-pay

## 2012-08-26 ENCOUNTER — Other Ambulatory Visit: Payer: Self-pay | Admitting: Family Medicine

## 2012-08-26 DIAGNOSIS — Z1231 Encounter for screening mammogram for malignant neoplasm of breast: Secondary | ICD-10-CM

## 2012-10-03 ENCOUNTER — Ambulatory Visit
Admission: RE | Admit: 2012-10-03 | Discharge: 2012-10-03 | Disposition: A | Discharge status: Home / Self Care | Payer: 59 | Source: Ambulatory Visit | Attending: Family Medicine | Admitting: Family Medicine

## 2012-10-03 DIAGNOSIS — Z1231 Encounter for screening mammogram for malignant neoplasm of breast: Secondary | ICD-10-CM

## 2012-10-13 ENCOUNTER — Other Ambulatory Visit: Payer: Self-pay | Admitting: Family Medicine

## 2012-10-13 DIAGNOSIS — R928 Other abnormal and inconclusive findings on diagnostic imaging of breast: Secondary | ICD-10-CM

## 2012-10-21 ENCOUNTER — Ambulatory Visit
Admission: RE | Admit: 2012-10-21 | Discharge: 2012-10-21 | Disposition: A | Discharge status: Home / Self Care | Payer: 59 | Source: Ambulatory Visit | Attending: Family Medicine | Admitting: Family Medicine

## 2012-10-21 DIAGNOSIS — R928 Other abnormal and inconclusive findings on diagnostic imaging of breast: Secondary | ICD-10-CM

## 2013-01-12 ENCOUNTER — Inpatient Hospital Stay (HOSPITAL_COMMUNITY): Payer: 59

## 2013-01-12 ENCOUNTER — Inpatient Hospital Stay (HOSPITAL_COMMUNITY)
Admission: EM | Admit: 2013-01-12 | Discharge: 2013-01-13 | DRG: 310 | Disposition: A | Discharge status: Home / Self Care | Payer: 59 | Attending: Internal Medicine | Admitting: Internal Medicine

## 2013-01-12 ENCOUNTER — Encounter (HOSPITAL_COMMUNITY): Payer: Self-pay | Admitting: *Deleted

## 2013-01-12 DIAGNOSIS — I4891 Unspecified atrial fibrillation: Principal | ICD-10-CM | POA: Diagnosis present

## 2013-01-12 DIAGNOSIS — I517 Cardiomegaly: Secondary | ICD-10-CM

## 2013-01-12 DIAGNOSIS — I10 Essential (primary) hypertension: Secondary | ICD-10-CM | POA: Diagnosis present

## 2013-01-12 HISTORY — DX: Hyperlipidemia, unspecified: E78.5

## 2013-01-12 HISTORY — DX: Dizziness and giddiness: R42

## 2013-01-12 HISTORY — DX: Essential (primary) hypertension: I10

## 2013-01-12 HISTORY — DX: Anxiety disorder, unspecified: F41.9

## 2013-01-12 HISTORY — DX: Cardiac murmur, unspecified: R01.1

## 2013-01-12 LAB — CBC
Hemoglobin: 15.3 g/dL — ABNORMAL HIGH (ref 12.0–15.0)
MCH: 31.9 pg (ref 26.0–34.0)
MCHC: 33.8 g/dL (ref 30.0–36.0)
Platelets: 235 10*3/uL (ref 150–400)
RBC: 4.8 MIL/uL (ref 3.87–5.11)

## 2013-01-12 LAB — CBC WITH DIFFERENTIAL/PLATELET
Eosinophils Absolute: 0.1 10*3/uL (ref 0.0–0.7)
Lymphs Abs: 1.3 10*3/uL (ref 0.7–4.0)
MCH: 31.2 pg (ref 26.0–34.0)
Neutrophils Relative %: 80 % — ABNORMAL HIGH (ref 43–77)
Platelets: 247 10*3/uL (ref 150–400)
RBC: 4.45 MIL/uL (ref 3.87–5.11)
WBC: 10.3 10*3/uL (ref 4.0–10.5)

## 2013-01-12 LAB — PROTIME-INR: Prothrombin Time: 12.5 seconds (ref 11.6–15.2)

## 2013-01-12 LAB — APTT: aPTT: 40 seconds — ABNORMAL HIGH (ref 24–37)

## 2013-01-12 LAB — COMPREHENSIVE METABOLIC PANEL
ALT: 41 U/L — ABNORMAL HIGH (ref 0–35)
ALT: 37 U/L — ABNORMAL HIGH (ref 0–35)
AST: 32 U/L (ref 0–37)
Alkaline Phosphatase: 71 U/L (ref 39–117)
BUN: 16 mg/dL (ref 6–23)
Calcium: 9.2 mg/dL (ref 8.4–10.5)
Calcium: 8.8 mg/dL (ref 8.4–10.5)
Creatinine, Ser: 0.72 mg/dL (ref 0.50–1.10)
GFR calc Af Amer: >90 mL/min (ref >90)
GFR calc Af Amer: >90 mL/min (ref >90)
Glucose, Bld: 102 mg/dL — ABNORMAL HIGH (ref 70–99)
Glucose, Bld: 95 mg/dL (ref 70–99)
Potassium: 3.9 mEq/L (ref 3.5–5.1)
Sodium: 140 mEq/L (ref 135–145)
Sodium: 138 mEq/L (ref 135–145)
Total Protein: 6.8 g/dL (ref 6.0–8.3)
Total Protein: 6.5 g/dL (ref 6.0–8.3)

## 2013-01-12 LAB — MAGNESIUM: Magnesium: 1.8 mg/dL (ref 1.5–2.5)

## 2013-01-12 LAB — PHOSPHORUS: Phosphorus: 3.4 mg/dL (ref 2.3–4.6)

## 2013-01-12 LAB — RAPID URINE DRUG SCREEN, HOSP PERFORMED
Amphetamines: NOT DETECTED
Tetrahydrocannabinol: NOT DETECTED

## 2013-01-12 MED ORDER — SODIUM CHLORIDE 0.9 % IV SOLN
INTRAVENOUS | Status: DC
  Administered 2013-01-12: 08:00:00 via INTRAVENOUS

## 2013-01-12 MED ORDER — ACETAMINOPHEN 325 MG PO TABS: 650.0000 mg | ORAL_TABLET | Freq: Four times a day (QID) | ORAL | Status: DC | PRN

## 2013-01-12 MED ORDER — ATORVASTATIN CALCIUM 20 MG PO TABS
20.0000 mg | ORAL_TABLET | Freq: Every day | ORAL | Status: DC
  Administered 2013-01-12: 20 mg via ORAL
  Filled 2013-01-12 (×2): qty 1

## 2013-01-12 MED ORDER — SODIUM CHLORIDE 0.9 % IJ SOLN
3.0000 mL | Freq: Two times a day (BID) | INTRAMUSCULAR | Status: DC
  Administered 2013-01-12 – 2013-01-13 (×3): 3 mL via INTRAVENOUS

## 2013-01-12 MED ORDER — LOSARTAN POTASSIUM 50 MG PO TABS
50.0000 mg | ORAL_TABLET | Freq: Every day | ORAL | Status: DC
  Administered 2013-01-12 – 2013-01-13 (×2): 50 mg via ORAL
  Filled 2013-01-12 (×2): qty 1

## 2013-01-12 MED ORDER — SODIUM CHLORIDE 0.9 % IV SOLN
INTRAVENOUS | Status: AC
  Administered 2013-01-12: 11:00:00 via INTRAVENOUS

## 2013-01-12 MED ORDER — SIMVASTATIN 40 MG PO TABS
40.0000 mg | ORAL_TABLET | Freq: Every morning | ORAL | Status: DC
  Filled 2013-01-12: qty 1

## 2013-01-12 MED ORDER — ONDANSETRON HCL 4 MG/2ML IJ SOLN: 4.0000 mg | Freq: Four times a day (QID) | INTRAMUSCULAR | Status: DC | PRN

## 2013-01-12 MED ORDER — HYDROCODONE-ACETAMINOPHEN 5-325 MG PO TABS: 1.0000 | ORAL_TABLET | ORAL | Status: DC | PRN

## 2013-01-12 MED ORDER — DEXTROSE 5 % IV SOLN: 10.0000 mg/h | INTRAVENOUS | Status: DC

## 2013-01-12 MED ORDER — ASPIRIN 81 MG PO CHEW
81.0000 mg | CHEWABLE_TABLET | Freq: Every day | ORAL | Status: DC
  Administered 2013-01-12 – 2013-01-13 (×2): 81 mg via ORAL
  Filled 2013-01-12 (×2): qty 1

## 2013-01-12 MED ORDER — DILTIAZEM HCL 100 MG IV SOLR
10.0000 mg/h | INTRAVENOUS | Status: DC
  Administered 2013-01-12: 10 mg/h via INTRAVENOUS

## 2013-01-12 MED ORDER — LORATADINE 10 MG PO TABS
10.0000 mg | ORAL_TABLET | Freq: Every day | ORAL | Status: DC
  Administered 2013-01-12 – 2013-01-13 (×2): 10 mg via ORAL
  Filled 2013-01-12 (×2): qty 1

## 2013-01-12 MED ORDER — NICOTINE POLACRILEX 2 MG MT GUM
2.0000 mg | CHEWING_GUM | OROMUCOSAL | Status: DC | PRN
  Filled 2013-01-12: qty 1

## 2013-01-12 MED ORDER — DILTIAZEM LOAD VIA INFUSION
20.0000 mg | Freq: Once | INTRAVENOUS | Status: DC
  Filled 2013-01-12: qty 20

## 2013-01-12 MED ORDER — ONDANSETRON HCL 4 MG PO TABS: 4.0000 mg | ORAL_TABLET | Freq: Four times a day (QID) | ORAL | Status: DC | PRN

## 2013-01-12 MED ORDER — ACETAMINOPHEN 650 MG RE SUPP: 650.0000 mg | Freq: Four times a day (QID) | RECTAL | Status: DC | PRN

## 2013-01-12 MED ORDER — DILTIAZEM HCL ER COATED BEADS 120 MG PO CP24
120.0000 mg | ORAL_CAPSULE | Freq: Every day | ORAL | Status: DC
  Administered 2013-01-12 – 2013-01-13 (×2): 120 mg via ORAL
  Filled 2013-01-12 (×2): qty 1

## 2013-01-12 MED ORDER — ENOXAPARIN SODIUM 30 MG/0.3ML ~~LOC~~ SOLN
30.0000 mg | Freq: Every day | SUBCUTANEOUS | Status: DC
  Administered 2013-01-12: 30 mg via SUBCUTANEOUS
  Filled 2013-01-12 (×2): qty 0.3

## 2013-01-12 MED ORDER — DILTIAZEM LOAD VIA INFUSION
20.0000 mg | Freq: Once | INTRAVENOUS | Status: AC
Start: 2013-01-12 — End: 2013-01-12
  Administered 2013-01-12: 20 mg via INTRAVENOUS
  Filled 2013-01-12: qty 20

## 2013-01-12 MED ORDER — DILTIAZEM HCL ER COATED BEADS 120 MG PO CP24: 60.0000 mg | ORAL_CAPSULE | Freq: Every day | ORAL | Status: DC

## 2013-01-12 MED ORDER — METOPROLOL TARTRATE 25 MG PO TABS
25.0000 mg | ORAL_TABLET | Freq: Two times a day (BID) | ORAL | Status: DC
  Filled 2013-01-12 (×2): qty 1

## 2013-01-12 NOTE — Progress Notes (Signed)
Benefit check in progress for the co pay for the following medication - Eliquis, Xarelto and PradaxaAbelino Derrick RN,BSN,MHA [phone]

## 2013-01-12 NOTE — ED Notes (Signed)
Sheila Bruce, Consulting civil engineer at bedside.

## 2013-01-12 NOTE — Evaluation (Addendum)
Physical Therapy One Time Evaluation Patient Details Name: Sheila Bruce MRN: [id number] DOB: 12/01/1945 Today's Date: 01/12/2013 Time: 4098-1191 PT Time Calculation (min): 9 min  PT Assessment / Plan / Recommendation Clinical Impression  Pt admitted for palpitations and SOB.  Pt currently NSR on cardizem IV and ambulated in hallway with HR staying around 85 bpm and SaO2 94% room air.  Pt denies SOB with activity and appears at her baseline in regards to mobility.  No PT needs identified at this time.    PT Assessment  Patent does not need any further PT services    Follow Up Recommendations  No PT follow up    Does the patient have the potential to tolerate intense rehabilitation      Barriers to Discharge        Equipment Recommendations  None recommended by PT    Recommendations for Other Services     Frequency      Precautions / Restrictions Precautions Precautions: None   Pertinent Vitals/Pain See above      Mobility  Bed Mobility Bed Mobility: Supine to Sit;Sit to Supine Supine to Sit: 7: Independent Sit to Supine: 7: Independent Transfers Transfers: Sit to Stand;Stand to Sit Sit to Stand: 7: Independent Stand to Sit: 7: Independent Ambulation/Gait Ambulation/Gait Assistance: 7: Independent Ambulation Distance (Feet): 200 Feet Assistive device: None Ambulation/Gait Assistance Details: occasionally pushed IV pole, no unsteady gait or LOB, HR stayed around 85 bpm and SaO2 94% during ambulation, denies SOB Gait Pattern: Within Functional Limits    Exercises     PT Diagnosis:    PT Problem List:   PT Treatment Interventions:     PT Goals    Visit Information  Last PT Received On: 01/12/13 Assistance Needed: +1    Subjective Data  Subjective: Yeah, I'm on my feet all day.     Prior Functioning  Home Living Home Adaptive Equipment: None Prior Function Level of Independence: Independent Able to Take Stairs?: Yes Vocation: Full time  employment Comments: works in urology OR at [employer] Communication: No difficulties    Cognition  Cognition Overall Cognitive Status: Appears within functional limits for tasks assessed/performed Arousal/Alertness: Awake/alert Orientation Level: Appears intact for tasks assessed Behavior During Session: [facility] for tasks performed    Extremity/Trunk Assessment Right Upper Extremity Assessment RUE ROM/Strength/Tone: [facility] for tasks assessed Left Upper Extremity Assessment LUE ROM/Strength/Tone: [facility] for tasks assessed Right Lower Extremity Assessment RLE ROM/Strength/Tone: [facility] for tasks assessed Left Lower Extremity Assessment LLE ROM/Strength/Tone: [facility] for tasks assessed Trunk Assessment Trunk Assessment: Normal   Balance    End of Session PT - End of Session Activity Tolerance: Patient tolerated treatment well Patient left: in bed;Other (comment) (with ECHO) Nurse Communication: Mobility status  GP     Damany Eastman,KATHrine E 01/12/2013, 2:58 PM Zenovia Jarred, PT, DPT 01/12/2013 Pager: [phone]

## 2013-01-12 NOTE — ED Notes (Signed)
Pt transferred to 1428 accompanied by family and Renita, RN on cardiac monitor with chart and personal belongings. Condition stable at time of transfer.

## 2013-01-12 NOTE — Consult Note (Signed)
CARDIOLOGY CONSULT NOTE  Patient ID: Sheila Bruce, MRN: [id number], DOB/AGE: 1946-07-22 67 y.o. Admit date: 01/12/2013   Date of Consult: 01/12/2013 Primary Physician: No primary provider on file. Primary Cardiologist: New to [facility]  Chief Complaint:increased HR Reason for Consult: afib  HPI: Sheila Bruce is a 67 y/o F with history of HTN, HL, former 35 yrs tobacco abuse, frequent EtOH use and no significant cardiac history who we are asked to see for new onset of atrial fibrillation. She has a history of echo done for an isolated episode of vertigo 03/2010 with EF 60-65%, grade 2 diastolic dysfunction, mild MR, mild-mod TR. She was in her usual state of health yesterday and went to bed as usual. This morning when she awoke, she felt as though she was having a panic attack with racing heart, mild dyspnea and chest pressure. 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 She was given a 20mg  cardizem bolus then gtt and spontaneously converted to NSR in the 60's. She denies current CP/SOB but has a headache and still feels shaky. She does not exercise but denies any exertional CP or SOB. No fevers, chills, cough, nausea, vomiting. She drinks 1 cup of coffee daily. She does not believe she's ever had any episodes like this before, although in retrospect now she's not sure if some of her prior panic attacks were accompanied by arrhythmias (however - these panic attacks were always appropriate to a concerning situation, whereas this morning's episode was not precipitated by anything). She does report a significant amount of stress with family and work. In the ER, EtOH neg, UDS neg, glu 102, ALT 41, otherwise no acute abnormalities. CXR with mild peribronchial thickening which may relate to chronic bronchitis/smoking.  Past Medical History  Diagnosis Date  . Hypertension   . Heart murmur   . Vertigo     . Hyperlipidemia   . Anxiety       Most Recent Cardiac Studies: 2D 03/2010 Study Conclusions - done for eval for dizziness - Left ventricle: The cavity size was normal. There was mild   concentric hypertrophy. Systolic function was normal. The   estimated ejection fraction was in the range of 60% to 65%.   Features are consistent with a pseudonormal left ventricular   filling pattern, with concomitant abnormal relaxation and   increased filling pressure (grade 2 diastolic dysfunction). - Mitral valve: Mild regurgitation. - Atrial septum: There was increased thickness of the septum,   consistent with lipomatous hypertrophy. No defect or patent   foramen ovale was identified. - Tricuspid valve: Mild-moderate regurgitation.  03/2010 Carotid Dopplers Right: mild calcific plaque origin ICA. Left: minimal plaque noted. Bilateral: no ICA stenosis. Vertebral artery flow is antegrade.   Surgical History:  Past Surgical History  Procedure Laterality Date  . Abdominal hysterectomy    . Cystoscopy    . Laproscopy-abdominal    . Tendon repair      Left hand     Home Meds: Prior to Admission medications   Medication Sig Start Date End Date Taking? Authorizing Provider  aspirin 81 MG tablet Take 81 mg by mouth daily.   Yes Historical Provider, MD  estrogens, conjugated, (PREMARIN) 0.3 MG tablet Take 0.3 mg by mouth.   Yes Historical Provider, MD  loratadine (CLARITIN) 10 MG tablet Take 10 mg by mouth daily.   Yes  Historical Provider, MD  LOSARTAN POTASSIUM PO Take 50 mg by mouth daily.    Yes Historical Provider, MD  simvastatin (ZOCOR) 40 MG tablet Take 40 mg by mouth every morning.   Yes Historical Provider, MD    Inpatient Medications:  . sodium chloride   Intravenous STAT  . aspirin  81 mg Oral Daily  . diltiazem  120 mg Oral Daily  . enoxaparin (LOVENOX) injection  30 mg Subcutaneous Daily  . loratadine  10 mg Oral Daily  . losartan  50 mg Oral Daily  . metoprolol tartrate  25  mg Oral BID  . simvastatin  40 mg Oral q morning - 10a  . sodium chloride  3 mL Intravenous Q12H    Allergies:  Allergies  Allergen Reactions  . Ciprocinonide (Fluocinolone) Rash  . Codeine Itching  . Ciprofloxacin   . Doxycycline Nausea And Vomiting    History   Social History  . Marital Status: Married    Spouse Name: N/A    Number of Children: N/A  . Years of Education: N/A   Occupational History  . Not on file.   Social History Main Topics  . Smoking status: Former Smoker    Quit date: 01/12/2005  . Smokeless tobacco: Never Used     Comment: Smoked for 35 yrs  . Alcohol Use: 1.2 oz/week    2 Glasses of wine per week     Comment: Daily, 2 glasses up to 1 bottle nightly.  . Drug Use: No  . Sexually Active: No   Other Topics Concern  . Not on file   Social History Narrative  . No narrative on file     Family History  Problem Relation Age of Onset  . Hypertension      Mother & father  . Hyperlipidemia      Mother & father  . Autoimmune disease      Mother (?liver) and sister, type unclear  . CAD      father had old MI on EKG  . CAD      multiple grandparents     Review of Systems: General: negative for chills, fever, night sweats. Lost ~5lbs intentionally for lent  Cardiovascular: see above. No orthopnea, PND, LEE. Dermatological: negative for rash Respiratory: negative for cough or wheezing Urologic: negative for hematuria Abdominal: negative for nausea, vomiting, diarrhea, bright red blood per rectum, melena, or hematemesis Neurologic: negative for visual changes, syncope, or dizziness All other systems reviewed and are otherwise negative except as noted above.  Labs:  Recent Labs  01/12/13 0900  TROPONINI <0.30   Lab Results  Component Value Date   WBC 7.0 01/12/2013   HGB 15.3* 01/12/2013   HCT 45.3 01/12/2013   MCV 94.4 01/12/2013   PLT 235 01/12/2013     Recent Labs Lab 01/12/13 0900  NA 140  K 3.9  CL 103  CO2 23  BUN 18   CREATININE 0.71  CALCIUM 9.2  PROT 6.8  BILITOT 0.6  ALKPHOS 71  ALT 41*  AST 32  GLUCOSE 102*     Radiology/Studies: CXR: IMPRESSION:1. No acute cardiopulmonary disease. 2. Mild peribronchial thickening which may relate to chronic bronchitis or smoking.  EKG:  Initial 01/12/13 at 7:35: Atrial fibrillation RVR 150bpm, borderline ST depression diffuse leads (baseline wander present) Repeat 01/12/13 at 11:32: NSR no acute ST-T changes (mild ST upsloping V4-V6)  Physical Exam: Blood pressure 116/62, pulse 61, temperature 97.7 F (36.5 C), temperature source Oral, resp. rate 16,  height 5' (1.524 m), weight 133 lb 13.1 oz (60.7 kg), SpO2 99.00%. General: Well developed, well nourished WF in no acute distress. Head: Normocephalic, atraumatic, sclera non-icteric, no xanthomas, nares are without discharge.  Neck: Negative for carotid bruits. JVD not elevated. Lungs: Clear bilaterally to auscultation without wheezes, rales, or rhonchi. Breathing is unlabored. Heart: RRR with S1 S2. Soft sem RUSB. No rubs or gallops appreciated. Abdomen: Soft, non-tender, non-distended with normoactive bowel sounds. No hepatomegaly. No rebound/guarding. No obvious abdominal masses. Msk:  Strength and tone appear normal for age. Extremities: No clubbing or cyanosis. No edema.  Distal pedal pulses are 2+ and equal bilaterally. Neuro: Alert and oriented X 3. No facial asymmetry. No focal deficit. Moves all extremities spontaneously. Psych:  Responds to questions appropriately with a normal affect.   Assessment and Plan:   1. Atrial fibrillation with RVR, first recognized episode 2. HTN 3. HL 4. EtOH abuse 5. Former tobacco abuse  Signed, Ronie Spies PA-C 01/12/2013, 2:38 PM   Patient seen, examined. Available data reviewed. Agree with findings, assessment, and plan as outlined by Ronie Spies, PA-C. The patient presented with sudden onset of tachypalpitations and has been diagnosed with AF with RVR. She was  started on IV cardizem and converted back to sinus. Exam reveals an age-appropriate woman in NAD. JVP is normal, lungs clear, heart regular without murmur or gallop, and no peripheral edema. EKG #1 shows AF with RVR at a rate of 150 bpm and nonspecific ST depression. EKG #2 shows NSR and is within normal limits. Labs reviewed and there are no significant abnormalities. CHADS2-Vasc = 2 and will = 3 later this year when she turns 67. Therefore anticoagulation is recommended (CHADS-Vasc >= 2).  Plan:  Convert from IV to PO diltiazem CD 120 mg daily first dose this afternoon  Stop Etoh (she was counseled regarding risk of recurrent AF)  Await 2D Echo result  Start oral anticoagulation with a NOAC. We have requested a Case Manager consult to see which drug will be affordable on her insurance coverage. We discussed risks/benefits/alternatives of NOAC's versus warfarin and she understands, would prefer NOAC.  Continue Losartan for BP and hopefully addition of cardizem CD will help improve control of HTN.  Tonny Bollman, M.D. 01/12/2013 2:55 PM

## 2013-01-12 NOTE — ED Notes (Signed)
Pt from home with reports of increased heart rate upon awakening this morning at 0630. Pt endorses hx of same but did not last this long. Pt also reports "a little" pressure in central chest radiating to left neck that started around 0700 but pain to neck as subsided. Pt also reports shortness of breath. MD at bedside presently.

## 2013-01-12 NOTE — ED Notes (Signed)
Spoke with Dr. Elisabeth Pigeon, okay for pt to go to telemetry, agrees pt does not need SDU, remains in NSR at present, IV Cardizem infusing at 5mg /hr, will monitor.

## 2013-01-12 NOTE — ED Notes (Signed)
Patient transported to X-ray 

## 2013-01-12 NOTE — ED Notes (Addendum)
Cardiology PA at bedside, aware that pt remains on IV Cardizem at 5mg /hr and HR remains NSR around 60.

## 2013-01-12 NOTE — Progress Notes (Signed)
  Echocardiogram 2D Echocardiogram has been performed.  Sheila Bruce 01/12/2013, 3:24 PM

## 2013-01-12 NOTE — ED Provider Notes (Addendum)
History     CSN: [id number]  Arrival date & time 01/12/13  4098   First MD Initiated Contact with Patient 01/12/13 0740      Chief Complaint  Patient presents with  . Palpitations  . Shortness of Breath    (Consider location/radiation/quality/duration/timing/severity/associated sxs/prior treatment) Patient is a 67 y.o. female presenting with palpitations and shortness of breath. The history is provided by the patient.  Palpitations  Associated symptoms include shortness of breath. Pertinent negatives include no fever, no chest pain, no abdominal pain, no nausea, no vomiting, no headaches, no back pain and no cough.  Shortness of Breath Associated symptoms: no abdominal pain, no chest pain, no cough, no fever, no headaches, no neck pain, no rash and no vomiting   pt with hx episodes palpitations intermittently over past few years, c/o palpitations onset this morning. Hr has remained fast. Mild bubbly sensation lower sternal area, no other current or recent cp or discomfort. Mild sob. No nausea/vomiting, or diaphoresis. No fever or chills. No heat intolerance, sweats or wt loss. No hx thyroid disease. Occasional caffeine use. No energy drink use. Moderate drinker, states drank several glasses wine last evening. No hx cad or dysrhythmia. Has had holter in past without specific dx. Recent health at baseline.     Past Medical History  Diagnosis Date  . Hypertension   . Heart murmur   . Vertigo     Past Surgical History  Procedure Laterality Date  . Abdominal hysterectomy    . Cystoscopy    . Laproscopy-abdominal    . Tendon repair      Left hand    History reviewed. No pertinent family history.  History  Substance Use Topics  . Smoking status: Former Smoker    Quit date: 01/12/2005  . Smokeless tobacco: Never Used  . Alcohol Use: 1.2 oz/week    2 Glasses of wine per week     Comment: Daily    OB History   Grav Para Term Preterm Abortions TAB SAB Ect Mult Living                   Review of Systems  Constitutional: Negative for fever and chills.  HENT: Negative for neck pain.   Eyes: Negative for redness.  Respiratory: Positive for shortness of breath. Negative for cough.   Cardiovascular: Positive for palpitations. Negative for chest pain and leg swelling.  Gastrointestinal: Negative for nausea, vomiting and abdominal pain.  Genitourinary: Negative for flank pain.  Musculoskeletal: Negative for back pain.  Skin: Negative for rash.  Neurological: Negative for headaches.  Hematological: Does not bruise/bleed easily.  Psychiatric/Behavioral: Negative for confusion.    Allergies  Ciprocinonide; Codeine; and Doxycycline  Home Medications  No current outpatient prescriptions on file.  BP 176/101  Temp(Src) 98.8 F (37.1 C) (Oral)  Resp 18  SpO2 98%  Physical Exam  Nursing note and vitals reviewed. Constitutional: She is oriented to person, place, and time. She appears well-developed and well-nourished. No distress.  HENT:  Nose: Nose normal.  Mouth/Throat: Oropharynx is clear and moist.  Eyes: Conjunctivae are normal. Pupils are equal, round, and reactive to light. No scleral icterus.  Neck: Normal range of motion. Neck supple. No tracheal deviation present. No thyromegaly present.  Cardiovascular: Normal heart sounds and intact distal pulses.   Tachycardic, irregular.   Pulmonary/Chest: Effort normal and breath sounds normal. No respiratory distress.  Abdominal: Soft. Normal appearance. She exhibits no distension. There is no tenderness.  Musculoskeletal: She exhibits  no edema and no tenderness.  Neurological: She is alert and oriented to person, place, and time.  Skin: Skin is warm and dry. No rash noted.  Psychiatric: She has a normal mood and affect.    ED Course  Procedures (including critical care time)  Results for orders placed during the hospital encounter of 01/12/13  CBC      Result Value Range   WBC 7.0  4.0 - 10.5 K/uL    RBC 4.80  3.87 - 5.11 MIL/uL   Hemoglobin 15.3 (*) 12.0 - 15.0 g/dL   HCT 16.1  09.6 - 04.5 %   MCV 94.4  78.0 - 100.0 fL   MCH 31.9  26.0 - 34.0 pg   MCHC 33.8  30.0 - 36.0 g/dL   RDW 40.9  81.1 - 91.4 %   Platelets 235  150 - 400 K/uL  COMPREHENSIVE METABOLIC PANEL      Result Value Range   Sodium 140  135 - 145 mEq/L   Potassium 3.9  3.5 - 5.1 mEq/L   Chloride 103  96 - 112 mEq/L   CO2 23  19 - 32 mEq/L   Glucose, Bld 102 (*) 70 - 99 mg/dL   BUN 18  6 - 23 mg/dL   Creatinine, Ser 7.82  0.50 - 1.10 mg/dL   Calcium 9.2  8.4 - 95.6 mg/dL   Total Protein 6.8  6.0 - 8.3 g/dL   Albumin 3.6  3.5 - 5.2 g/dL   AST 32  0 - 37 U/L   ALT 41 (*) 0 - 35 U/L   Alkaline Phosphatase 71  39 - 117 U/L   Total Bilirubin 0.6  0.3 - 1.2 mg/dL   GFR calc non Af Amer 89 (*) >90 mL/min   GFR calc Af Amer >90  >90 mL/min  TROPONIN I      Result Value Range   Troponin I <0.30  <0.30 ng/mL        MDM  Iv ns. Monitor. Labs. O2.   Pt with rapid afib, hr 150's.  cardizem bolus and gtt.  Reviewed nursing notes and prior charts for additional history.    Date: 01/12/2013  Rate: 150  Rhythm: afib w rvr  QRS Axis: normal  Intervals: normal  ST/T Wave abnormalities: nonspecific ST changes  Conduction Disutrbances:none  Narrative Interpretation:   Old EKG Reviewed: changes noted  Recheck hr improved, remains on cardizem gtt, in afib.   Chest discomfort resolved w rate control.  CRITICAL CARE Performed by: Suzi Roots   Total critical care time: 35  Critical care time was exclusive of separately billable procedures and treating other patients.  Critical care was necessary to treat or prevent imminent or life-threatening deterioration.  Critical care was time spent personally by me on the following activities: development of treatment plan with patient and/or surrogate as well as nursing, discussions with consultants, evaluation of patient's response to treatment, examination of  patient, obtaining history from patient or surrogate, ordering and performing treatments and interventions, ordering and review of laboratory studies, ordering and review of radiographic studies, pulse oximetry and re-evaluation of patient's condition.  Critical care re rapid afib, uncontrolled hr, with chest discomfort requiring iv meds/gtt to control rate.    Recheck no cp. Rate improved.  [facility] re [facility] - they will admit, team 1, tele. They request card consult as well, card called.   Discussed w card on call, Dr Elease Hashimoto, they will consult.  Suzi Roots, MD 01/12/13 1021  Suzi Roots, MD 01/12/13 1027

## 2013-01-12 NOTE — ED Notes (Signed)
MD at bedside. 

## 2013-01-12 NOTE — Progress Notes (Signed)
Po Cardizem given at 1531. IV Cardizem gtt d/ced at 1435 per MD orders. Pt HR at 1635 was 68, NSR. At 1750 pt's HR remained controled at 60-63, NSR. Will continue to monitor pt's HR rhythm . Pt has no complaints at this time.   Arta Bruce [facility] 01/12/2013 6:01 PM

## 2013-01-12 NOTE — ED Notes (Addendum)
PT'S PULSE RATE HAS BEEN IN THE 70S-90S, NOT 50S, REMAINS IN A-FIB.

## 2013-01-12 NOTE — Progress Notes (Signed)
CO PAY FOR THE FOLLOWING MEDICATIONS  Eliquis 5 MG 30DAY SUPPLY IS $44.08;  Xarelto  5 MG 30DAY SUPPLY IS  $86.90   Pradaxa  5 MG 30DAY SUPPLY IS $44.08 PER MIRIAM/ [facility] [phone].

## 2013-01-12 NOTE — ED Notes (Signed)
Dr. Devine at bedside

## 2013-01-12 NOTE — H&P (Signed)
[facility] History and Physical  Sheila Bruce WUJ:[id number] DOB: [birth year] DOA: 01/12/2013  Referring physician: ER physician PCP: No primary provider on file.   Chief Complaint: palpitations, shortness of breath  HPI:  67 -year-old female with past medical history significant for hypertension who presented to [facility] ED 01/12/2013 with complaints of palpitations started the morning of the admission. She had an episode of shortness of breath and chest tightness but this has all resolved at this time. Patient has no complaints of abdominal pain, nausea or vomiting. No fevers, chills or cough. No lightheadedness or loss of consciousness. In ED, evaluation included chest x-ray which was negative for acute cardiopulmonary process but it did show chronic bronchitic changes. Patient was found to be in atrial fibrillation on admission and was started on Cardizem drip. She has subsequently converted to normal sinus rhythm. Further, alcohol level was negative and urine drug screen was negative.   Assessment and plan:  Principal problem: *New onset atrial fibrillation  As mentioned above initially was on Cardizem drip but has converted to normal sinus rhythm  Appreciate cardiology consult  Recommendation for Cardizem 120 mg daily, anticoagulation and losartan 50 mg daily  Patient counseled on alcohol cessation  Code Status: Full Family Communication: Pt at bedside Disposition Plan: Admit for further evaluation  Manson Passey, MD  [facility] Pager [phone]  If 7PM-7AM, please contact night-coverage [email or web address] Password [email or web address] 01/12/2013, 10:50 AM   Review of Systems:  Constitutional: Negative for fever, chills and malaise/fatigue. Negative for diaphoresis.  HENT: Negative for hearing loss, ear pain, nosebleeds, congestion, sore throat, neck pain, tinnitus and ear discharge.   Eyes: Negative for blurred vision, double vision, photophobia, pain, discharge and redness.  Respiratory: Negative  for cough, hemoptysis, sputum production, positive for shortness of breath, no wheezing and stridor.   Cardiovascular: Negative for chest pain, positive for palpitations, orthopnea, claudication and leg swelling.  Gastrointestinal: Negative for nausea, vomiting and abdominal pain. Negative for heartburn, constipation, blood in stool and melena.  Genitourinary: Negative for dysuria, urgency, frequency, hematuria and flank pain.  Musculoskeletal: Negative for myalgias, back pain, joint pain and falls.  Skin: Negative for itching and rash.  Neurological: Negative for dizziness and weakness. Negative for tingling, tremors, sensory change, speech change, focal weakness, loss of consciousness and headaches.  Endo/Heme/Allergies: Negative for environmental allergies and polydipsia. Does not bruise/bleed easily.  Psychiatric/Behavioral: Negative for suicidal ideas. The patient is not nervous/anxious.      Past Medical History  Diagnosis Date  . Hypertension   . Heart murmur   . Vertigo    Past Surgical History  Procedure Laterality Date  . Abdominal hysterectomy    . Cystoscopy    . Laproscopy-abdominal    . Tendon repair      Left hand   Social History:  reports that she quit smoking about 8 years ago. She has never used smokeless tobacco. She reports that she drinks about 1.2 ounces of alcohol per week. She reports that she does not use illicit drugs.  Allergies  Allergen Reactions  . Ciprocinonide (Fluocinolone) Rash  . Codeine Itching  . Doxycycline Nausea And Vomiting    Family History:  Family History  Problem Relation Age of Onset  . Hypertension      Mother & father  . Hyperlipidemia      Mother & father  . Autoimmune disease      Mother (?liver) and sister, type unclear  . CAD      father had old MI  on EKG  . CAD      multiple grandparents     Prior to Admission medications   Medication Sig Start Date End Date Taking? Authorizing Provider  aspirin 81 MG tablet  Take 81 mg by mouth daily.   Yes Historical Provider, MD  estrogens, conjugated, (PREMARIN) 0.3 MG tablet Take 0.3 mg by mouth.   Yes Historical Provider, MD  loratadine (CLARITIN) 10 MG tablet Take 10 mg by mouth daily.   Yes Historical Provider, MD  LOSARTAN POTASSIUM PO Take 50 mg by mouth daily.    Yes Historical Provider, MD  simvastatin (ZOCOR) 40 MG tablet Take 40 mg by mouth every morning.   Yes Historical Provider, MD   Physical Exam: Filed Vitals:   01/12/13 0945 01/12/13 0953 01/12/13 1000 01/12/13 1015  BP: 133/77  109/91 114/85  Pulse: 99  61 117  Temp:  98.2 F (36.8 C)    TempSrc:  Oral    Resp: 14 12 19 13   SpO2: 98% 99% 98% 96%    Physical Exam  Constitutional: Appears well-developed and well-nourished. No distress.  HENT: Normocephalic. External right and left ear normal. Oropharynx is clear and moist.  Eyes: Conjunctivae and EOM are normal. PERRLA, no scleral icterus.  Neck: Normal ROM. Neck supple. No JVD. No tracheal deviation. No thyromegaly.  CVS: RRR, S1/S2 +, no murmurs, no gallops, no carotid bruit.  Pulmonary: Effort and breath sounds normal, no stridor, rhonchi, wheezes, rales.  Abdominal: Soft. BS +,  no distension, tenderness, rebound or guarding.  Musculoskeletal: Normal range of motion. No edema and no tenderness.  Lymphadenopathy: No lymphadenopathy noted, cervical, inguinal. Neuro: Alert. Normal reflexes, muscle tone coordination. No cranial nerve deficit. Skin: Skin is warm and dry. No rash noted. Not diaphoretic. No erythema. No pallor.  Psychiatric: Normal mood and affect. Behavior, judgment, thought content normal.   Labs on Admission:  Basic Metabolic Panel:  Recent Labs Lab 01/12/13 0900  NA 140  K 3.9  CL 103  CO2 23  GLUCOSE 102*  BUN 18  CREATININE 0.71  CALCIUM 9.2   Liver Function Tests:  Recent Labs Lab 01/12/13 0900  AST 32  ALT 41*  ALKPHOS 71  BILITOT 0.6  PROT 6.8  ALBUMIN 3.6   No results found for this  basename: LIPASE, AMYLASE,  in the last 168 hours No results found for this basename: AMMONIA,  in the last 168 hours CBC:  Recent Labs Lab 01/12/13 0800  WBC 7.0  HGB 15.3*  HCT 45.3  MCV 94.4  PLT 235   Cardiac Enzymes:  Recent Labs Lab 01/12/13 0900  TROPONINI <0.30   BNP: No components found with this basename: POCBNP,  CBG: No results found for this basename: GLUCAP,  in the last 168 hours  Radiological Exams on Admission: No results found.  EKG: Atrial fibrillation

## 2013-01-13 LAB — COMPREHENSIVE METABOLIC PANEL
BUN: 18 mg/dL (ref 6–23)
CO2: 26 mEq/L (ref 19–32)
Calcium: 9 mg/dL (ref 8.4–10.5)
Creatinine, Ser: 0.75 mg/dL (ref 0.50–1.10)
GFR calc Af Amer: >90 mL/min (ref >90)
GFR calc non Af Amer: 88 mL/min — ABNORMAL LOW (ref >90)
Glucose, Bld: 97 mg/dL (ref 70–99)
Total Protein: 5.7 g/dL — ABNORMAL LOW (ref 6.0–8.3)

## 2013-01-13 LAB — CBC
HCT: 38.5 % (ref 36.0–46.0)
Hemoglobin: 12.9 g/dL (ref 12.0–15.0)
WBC: 6.2 10*3/uL (ref 4.0–10.5)

## 2013-01-13 LAB — GLUCOSE, CAPILLARY: Glucose-Capillary: 154 mg/dL — ABNORMAL HIGH (ref 70–99)

## 2013-01-13 MED ORDER — ATORVASTATIN CALCIUM 20 MG PO TABS: 20.0000 mg | ORAL_TABLET | Freq: Every day | ORAL | Status: DC

## 2013-01-13 MED ORDER — DILTIAZEM HCL ER COATED BEADS 120 MG PO CP24: 120.0000 mg | ORAL_CAPSULE | Freq: Every day | ORAL | Status: DC

## 2013-01-13 MED ORDER — APIXABAN 5 MG PO TABS: 5.0000 mg | ORAL_TABLET | Freq: Two times a day (BID) | ORAL | Status: AC

## 2013-01-13 MED ORDER — APIXABAN 5 MG PO TABS
5.0000 mg | ORAL_TABLET | Freq: Two times a day (BID) | ORAL | Status: DC
  Administered 2013-01-13: 5 mg via ORAL
  Filled 2013-01-13 (×2): qty 1

## 2013-01-13 NOTE — Progress Notes (Signed)
Information given to patient's nurse to give to the patient at discharge about Eliquis; B Minal Stuller RN,BSN,MHA

## 2013-01-13 NOTE — Progress Notes (Signed)
   CARE MANAGEMENT NOTE 01/13/2013  Patient:  Sheila Bruce, Sheila Bruce   Account Number:  [id number]  Date Initiated:  01/13/2013  Documentation initiated by:  Jiles Crocker  Subjective/Objective Assessment:   ADMITTED WITH ATRIAL FIB, NEW ONSET     Action/Plan:   PCP IS DR Sigmund Hazel WITH [facility]  INDEPENDENT PRIOR TO ADMISSION; WORKS IN THE OR DEPT AT [facility]   Anticipated DC Date:  01/15/2013   Anticipated DC Plan:  HOME/SELF CARE      DC Planning Services  CM consult          Status of service:  In process, will continue to follow Medicare Important Message given?  NA - LOS <3 / Initial given by admissions (If response is "NO", the following Medicare IM given date fields will be blank)   Per UR Regulation:  Reviewed for med. necessity/level of care/duration of stay  Comments:  01/13/2013- PATIENT HAS AN APT WITH DR MILLER FOR 01/14/2013- PATIENT STATED THAT SHE WOULD CALL TO RESCHEDULE APT; PHARMACY OF CHOICE IS [facility] IN [place] - NO PROBLEMS GETTING HER MEDICATION BUT IS WORRIED ABOUT THE COPAY COST OF NEW MEDS- CM INFORMED PT OF THE BENEFIT CHECK ON 3 MEDICATIONS COMPLETED YESTERDAY; PATIENT HAS LOTS OF ANXIETY WITH FAMILY ISSUES, IN ROOM TEARFUL AFTER CONVERSATION WITH FAMILY MEMBERS ON THE TELEPHONE; EMOTIONAL SUPPORT GIVEN; B Jef Futch RN,BSN,MHA

## 2013-01-13 NOTE — Discharge Summary (Signed)
Physician Discharge Summary  Sheila Bruce ZOX:[id number] DOB: 02-06-1946 DOA: 01/12/2013  PCP: No primary provider on file.  Admit date: 01/12/2013 Discharge date: 01/13/2013    Recommendations for Outpatient Follow-up:  1. Follow upw with cardiology in one week  Discharge Diagnoses:  Active Problems:   Atrial fibrillation with rapid ventricular response   Discharge Condition: fair  Diet recommendation: low salt diet  Filed Weights   01/12/13 1400 01/13/13 0626  Weight: 60.7 kg (133 lb 13.1 oz) 60.6 kg (133 lb 9.6 oz)    History of present illness:  67 -year-old female with past medical history significant for hypertension who presented to [facility] ED 01/12/2013 with complaints of palpitations started the morning of the admission. She had an episode of shortness of breath and chest tightness but this has all resolved at this time. Patient has no complaints of abdominal pain, nausea or vomiting. No fevers, chills or cough. No lightheadedness or loss of consciousness.  In ED, evaluation included chest x-ray which was negative for acute cardiopulmonary process but it did show chronic bronchitic changes. Patient was found to be in atrial fibrillation on admission and was started on Cardizem drip. She has subsequently converted to normal sinus rhythm. Further, alcohol level was negative and urine drug screen was negative.   Hospital Course:  New onset atrial fibrillation  As mentioned above initially was on Cardizem drip but has converted to normal sinus rhythm  Cardiology called and recommendations given.  Recommendation for Cardizem 120 mg daily, anticoagulation and losartan 50 mg daily  Patient counseled on alcohol cessation She was also started on eliquis and outpatient follow up with cardiology.       Consultations:  cardiology  Discharge Exam: Filed Vitals:   01/12/13 1400 01/12/13 2303 01/13/13 0626 01/13/13 1218  BP: 116/62 134/75 142/70 150/62  Pulse: 61 64 62 68  Temp:  97.7 F (36.5 C) 97.9 F (36.6 C) 97.8 F (36.6 C)   TempSrc: Oral Oral Oral   Resp: 16 16 16    Height: 5' (1.524 m)     Weight: 60.7 kg (133 lb 13.1 oz)  60.6 kg (133 lb 9.6 oz)   SpO2: 99% 99% 100%   alert afebrile comfortable.  CVS: RRR, S1/S2 +, no murmurs, no gallops, no carotid bruit.  Pulmonary: Effort and breath sounds normal, no stridor, rhonchi, wheezes, rales.  Abdominal: Soft. BS +, no distension, tenderness, rebound or guarding.  Musculoskeletal: Normal range of motion. No edema and no tenderness.  Lymphadenopathy: No lymphadenopathy noted, cervical, inguinal. Neuro: Alert. Normal reflexes, muscle tone coordination. No cranial nerve deficit.  Skin: Skin is warm and dry. No rash noted. Not diaphoretic. No erythema. No pallor.     Discharge Instructions  Discharge Orders   Future Orders Complete By Expires     Call MD for:  persistant dizziness or light-headedness  As directed     Diet - low sodium heart healthy  As directed     Discharge instructions  As directed     Comments:      Follow up with cardiology and PCP in one week.    Increase activity slowly  As directed         Medication List    STOP taking these medications       simvastatin 40 MG tablet  Commonly known as:  ZOCOR      TAKE these medications       apixaban 5 MG Tabs tablet  Commonly known as:  ELIQUIS  Take 1 tablet (5 mg total) by mouth 2 (two) times daily.     aspirin 81 MG tablet  Take 81 mg by mouth daily.     atorvastatin 20 MG tablet  Commonly known as:  LIPITOR  Take 1 tablet (20 mg total) by mouth daily at 6 PM.     diltiazem 120 MG 24 hr capsule  Commonly known as:  CARDIZEM CD  Take 1 capsule (120 mg total) by mouth daily.     estrogens (conjugated) 0.3 MG tablet  Commonly known as:  PREMARIN  Take 0.3 mg by mouth.     loratadine 10 MG tablet  Commonly known as:  CLARITIN  Take 10 mg by mouth daily.     LOSARTAN POTASSIUM PO  Take 50 mg by mouth daily.            Follow-up Information   Follow up with Marca Ancona, MD. Schedule an appointment as soon as possible for a visit in 1 week.   Contact information:   [address] [place] [address] [phone]        The results of significant diagnostics from this hospitalization (including imaging, microbiology, ancillary and laboratory) are listed below for reference.    Significant Diagnostic Studies: Dg Chest 2 View  01/12/2013  *RADIOLOGY REPORT*  Clinical Data: Atrial fibrillation.  Hypertension.  CHEST - 2 VIEW  Comparison: 01/29/2005  Findings: Mild lower thoracic spondylosis. Midline trachea.  Normal heart size and mediastinal contours. No pleural effusion or pneumothorax.  Diffuse peribronchial thickening.  EKG leads project over the upper lobes bilaterally. No congestive failure.  IMPRESSION: 1.  No acute cardiopulmonary disease. 2.  Mild peribronchial thickening which may relate to chronic bronchitis or smoking.   Original Report Authenticated By: Jeronimo Greaves, M.D.     Microbiology: No results found for this or any previous visit (from the past 240 hour(s)).   Labs: Basic Metabolic Panel:  Recent Labs Lab 01/12/13 0900 01/12/13 1432 01/13/13 0443  NA 140 138 136  K 3.9 4.5 4.2  CL 103 101 103  CO2 23 27 26   GLUCOSE 102* 95 97  BUN 18 16 18   CREATININE 0.71 0.72 0.75  CALCIUM 9.2 8.8 9.0  MG  --  1.8  --   PHOS  --  3.4  --    Liver Function Tests:  Recent Labs Lab 01/12/13 0900 01/12/13 1432 01/13/13 0443  AST 32 28 23  ALT 41* 37* 31  ALKPHOS 71 64 59  BILITOT 0.6 1.0 0.7  PROT 6.8 6.5 5.7*  ALBUMIN 3.6 3.5 3.1*   No results found for this basename: LIPASE, AMYLASE,  in the last 168 hours No results found for this basename: AMMONIA,  in the last 168 hours CBC:  Recent Labs Lab 01/12/13 0800 01/12/13 1432 01/13/13 0443  WBC 7.0 10.3 6.2  NEUTROABS  --  8.2*  --   HGB 15.3* 13.9 12.9  HCT 45.3 42.2  38.5  MCV 94.4 94.8 94.8  PLT 235 247 199   Cardiac Enzymes:  Recent Labs Lab 01/12/13 0900  TROPONINI <0.30   BNP: BNP (last 3 results) No results found for this basename: PROBNP,  in the last 8760 hours CBG:  Recent Labs Lab 01/13/13 0811  GLUCAP 154*       Signed:  Noal Abshier  [facility] 01/13/2013, 12:30 PM

## 2013-01-13 NOTE — Progress Notes (Signed)
Patient ID: Sheila Bruce, female   DOB: [birth year], 67 y.o.   MRN: [id number]    SUBJECTIVE: Patient remains in NSR.  No complaints.   Marland Kitchen apixaban  5 mg Oral BID  . aspirin  81 mg Oral Daily  . atorvastatin  20 mg Oral q1800  . diltiazem  120 mg Oral Daily  . loratadine  10 mg Oral Daily  . losartan  50 mg Oral Daily  . sodium chloride  3 mL Intravenous Q12H      Filed Vitals:   01/12/13 1130 01/12/13 1400 01/12/13 2303 01/13/13 0626  BP: 132/66 116/62 134/75 142/70  Pulse: 67 61 64 62  Temp:  97.7 F (36.5 C) 97.9 F (36.6 C) 97.8 F (36.6 C)  TempSrc:  Oral Oral Oral  Resp: 16 16 16 16   Height:  5' (1.524 m)    Weight:  133 lb 13.1 oz (60.7 kg)  133 lb 9.6 oz (60.6 kg)  SpO2: 99% 99% 99% 100%    Intake/Output Summary (Last 24 hours) at 01/13/13 0805 Last data filed at 01/13/13 0615  Gross per 24 hour  Intake    483 ml  Output   1101 ml  Net   -618 ml    LABS: Basic Metabolic Panel:  Recent Labs  40/98/11 0900 01/12/13 1432 01/13/13 0443  NA 140 138 136  K 3.9 4.5 4.2  CL 103 101 103  CO2 23 27 26   GLUCOSE 102* 95 97  BUN 18 16 18   CREATININE 0.71 0.72 0.75  CALCIUM 9.2 8.8 9.0  MG  --  1.8  --   PHOS  --  3.4  --    Liver Function Tests:  Recent Labs  01/12/13 1432 01/13/13 0443  AST 28 23  ALT 37* 31  ALKPHOS 64 59  BILITOT 1.0 0.7  PROT 6.5 5.7*  ALBUMIN 3.5 3.1*   No results found for this basename: LIPASE, AMYLASE,  in the last 72 hours CBC:  Recent Labs  01/12/13 0800 01/12/13 1432 01/13/13 0443  WBC 7.0 10.3 6.2  NEUTROABS  --  8.2*  --   HGB 15.3* 13.9 12.9  HCT 45.3 42.2 38.5  MCV 94.4 94.8 94.8  PLT 235 247 199   Cardiac Enzymes:  Recent Labs  01/12/13 0900  TROPONINI <0.30   BNP: No components found with this basename: POCBNP,  D-Dimer: No results found for this basename: DDIMER,  in the last 72 hours Hemoglobin A1C: No results found for this basename: HGBA1C,  in the last 72 hours Fasting Lipid Panel: No  results found for this basename: CHOL, HDL, LDLCALC, TRIG, CHOLHDL, LDLDIRECT,  in the last 72 hours Thyroid Function Tests:  Recent Labs  01/12/13 1432  TSH 0.619   Anemia Panel: No results found for this basename: VITAMINB12, FOLATE, FERRITIN, TIBC, IRON, RETICCTPCT,  in the last 72 hours  RADIOLOGY: Dg Chest 2 View  01/12/2013  *RADIOLOGY REPORT*  Clinical Data: Atrial fibrillation.  Hypertension.  CHEST - 2 VIEW  Comparison: 01/29/2005  Findings: Mild lower thoracic spondylosis. Midline trachea.  Normal heart size and mediastinal contours. No pleural effusion or pneumothorax.  Diffuse peribronchial thickening.  EKG leads project over the upper lobes bilaterally. No congestive failure.  IMPRESSION: 1.  No acute cardiopulmonary disease. 2.  Mild peribronchial thickening which may relate to chronic bronchitis or smoking.   Original Report Authenticated By: Jeronimo Greaves, M.D.     PHYSICAL EXAM General: NAD Neck: No JVD, no thyromegaly or  thyroid nodule.  Lungs: Clear to auscultation bilaterally with normal respiratory effort. CV: Nondisplaced PMI.  Heart regular S1/S2, no S3/S4, no murmur.  No peripheral edema.  No carotid bruit.  Normal pedal pulses.  Abdomen: Soft, nontender, no hepatosplenomegaly, no distention.  Neurologic: Alert and oriented x 3.  Psych: Normal affect. Extremities: No clubbing or cyanosis.   TELEMETRY: Reviewed telemetry pt in NSR  ASSESSMENT AND PLAN:  67 yo with history of HTN presented with atrial fibrillation/RVR, converted to NSR on diltiazem gtt.  EF 60-65% on echo.  - Continue diltiazem CD 120 mg daily - Add Eliquis 5 mg bid given CHADSVASC = 2.  If this is not well-covered by her insurance, Xarelto would be the next preferred agent.  - Cut back on ETOH intake.  - Given diltiazem use, would stop simvastatin and use atorvastatin 20 mg daily instead.  - I will arrange outpatient followup for her.   Marca Ancona 01/13/2013 8:07 AM

## 2013-01-22 ENCOUNTER — Encounter: Payer: Self-pay | Admitting: Physician Assistant

## 2013-01-22 ENCOUNTER — Ambulatory Visit (INDEPENDENT_AMBULATORY_CARE_PROVIDER_SITE_OTHER): Payer: 59 | Admitting: Physician Assistant

## 2013-01-22 VITALS — BP 134/72 | HR 54 | Ht 60.0 in | Wt 133.1 lb

## 2013-01-22 DIAGNOSIS — R079 Chest pain, unspecified: Secondary | ICD-10-CM

## 2013-01-22 DIAGNOSIS — I4891 Unspecified atrial fibrillation: Secondary | ICD-10-CM

## 2013-01-22 DIAGNOSIS — I10 Essential (primary) hypertension: Secondary | ICD-10-CM

## 2013-01-22 MED ORDER — DILTIAZEM HCL ER COATED BEADS 120 MG PO CP24: 120.0000 mg | ORAL_CAPSULE | Freq: Every day | ORAL | Status: DC

## 2013-01-22 NOTE — Progress Notes (Signed)
[address] [place], [place]  [address] Phone: [phone] Fax:  [phone]  Date:  01/22/2013   ID:  Sheila Bruce, DOB Aug 05, 1946, MRN [id number]  PCP:  Neldon Labella, MD  Primary Cardiologist:  Dr. Tonny Bollman     History of Present Illness: Sheila Bruce is a 67 y.o. female who returns for follow up after recent admission to the hospital with atrial fibrillation with RVR. She has a history of HTN, HL, prior tobacco abuse and ETOH abuse. She presented to the emergency room with palpitations and was noted to be in atrial fibrillation with RVR. She converted to NSR with diltiazem IV. CHADS2-VASc = 2. She was felt to require long-term anticoagulation.  Echo 4/14: Mild concentric LVH, EF 60-65%, grade 1 diastolic dysfunction, trivial MR, PASP 29. Eliquis 5 mg twice a day was recommended for anticoagulation. Simvastatin was switched atorvastatin due to diltiazem use.  Since d/c, she is doing well.  She had one episode of palpitations last night that lasted 5 seconds.  No symptoms like what took her to the ED.  She does note CP with exertion with assoc dyspnea at times that improves with rest.  She denies syncope.  No orthopnea, PND, edema.    Labs (4/14):  K 4.2, Cr 0.75, ALT 31, Hgb 12.9, TSH 0.619  Wt Readings from Last 3 Encounters:  01/22/13 133 lb 1.9 oz (60.383 kg)  01/13/13 133 lb 9.6 oz (60.6 kg)     Past Medical History  Diagnosis Date  . Hypertension   . Heart murmur   . Vertigo   . Hyperlipidemia   . Anxiety   . Atrial fibrillation     a. Echo 4/14: Mild concentric LVH, EF 60-65%, grade 1 diastolic dysfunction, trivial MR, PASP 29;  b. Eliquis 5 bid    Current Outpatient Prescriptions  Medication Sig Dispense Refill  . apixaban (ELIQUIS) 5 MG TABS tablet Take 1 tablet (5 mg total) by mouth 2 (two) times daily.  60 tablet  0  . atorvastatin (LIPITOR) 20 MG tablet Take 1 tablet (20 mg total) by mouth daily at 6 PM.  30 tablet  0  .  diltiazem (CARDIZEM CD) 120 MG 24 hr capsule Take 1 capsule (120 mg total) by mouth daily.  30 capsule  1  . estrogens, conjugated, (PREMARIN) 0.3 MG tablet Take 0.3 mg by mouth.      . fluticasone (FLONASE) 50 MCG/ACT nasal spray Place 2 sprays into the nose daily.      Marland Kitchen ibuprofen (ADVIL) 200 MG tablet Take 200 mg by mouth as needed for pain.      Marland Kitchen loratadine (CLARITIN) 10 MG tablet Take 10 mg by mouth daily.      Marland Kitchen LOSARTAN POTASSIUM PO Take 50 mg by mouth daily.       . Multiple Vitamin (MULTIVITAMIN) tablet Take 1 tablet by mouth daily.       No current facility-administered medications for this visit.    Allergies:    Allergies  Allergen Reactions  . Ciprocinonide (Fluocinolone) Rash  . Codeine Itching  . Ciprofloxacin   . Doxycycline Nausea And Vomiting    Social History:  The patient  reports that she quit smoking about 8 years ago. She has never used smokeless tobacco. She reports that she drinks about 1.2 ounces of alcohol per week. She reports that she does not use illicit drugs.   Family History:  The patient's family history includes Autoimmune disease in  an unspecified family member; CAD in some unspecified family members; Hyperlipidemia in an unspecified family member; and Hypertension in an unspecified family member.   ROS:  Please see the history of present illness.      All other systems reviewed and negative.   PHYSICAL EXAM: VS:  BP 134/72  Pulse 54  Ht 5' (1.524 m)  Wt 133 lb 1.9 oz (60.383 kg)  BMI 26 kg/m2 Well nourished, well developed, in no acute distress HEENT: normal Neck: no JVD Vascular:  No carotid bruits Cardiac:  normal S1, S2; RRR; 1-2/6 systolic murmur along RUSB Lungs:  clear to auscultation bilaterally, no wheezing, rhonchi or rales Abd: soft, nontender, no hepatomegaly Ext: no edema Skin: warm and dry Neuro:  CNs 2-12 intact, no focal abnormalities noted  EKG:  Sinus bradycardia, HR 54, no change from prior tracing    ASSESSMENT AND  PLAN:  1. Atrial Fibrillation:  Maintaining sinus rhythm.  Continue Eliquis and Diltiazem.  She has some fatigue later in the day.  I suggested she take her Diltiazem QHS to see if this helps.  Refer to CVRR for NOAC consultation.   2. Chest Pain:  Etiology unclear.  She has several risk factors.  Will arrange ETT-Myoview.  Will also need to r/o CAD in case she needs anti-arrhythmic in the future. 3. Hypertension:  Controlled.  Continue current therapy.  4. Hyperlipidemia:  Continue statin. 5. ETOH Abuse:  She has cut way back.  I encouraged her to continue to refrain from this. 6. Disposition:  F/u with Dr. Tonny Bollman in 3 mos.   SignedTereso Newcomer, PA-C  10:09 AM 01/22/2013

## 2013-01-22 NOTE — Patient Instructions (Addendum)
Change Diltiazem to taking at bedtime  Your physician has requested that you have en exercise stress myoview. For further information please visit [email or web address] Please follow instruction sheet, as given.   Please make an appt to see CVRR at the end of April for new starting on Eliquis  Please follow up with Dr. Excell Seltzer 3 months

## 2013-01-28 ENCOUNTER — Ambulatory Visit (INDEPENDENT_AMBULATORY_CARE_PROVIDER_SITE_OTHER): Payer: 59 | Admitting: Pharmacist

## 2013-01-28 DIAGNOSIS — I4891 Unspecified atrial fibrillation: Secondary | ICD-10-CM

## 2013-01-28 NOTE — Progress Notes (Signed)
Pt was started on Eliquis for atrial fibrillation with RVR on April 8th, 2014.    Reviewed patients medication list.  Pt is not currently on any combined P-gp and strong CYP3A4 inhibitors/inducers (ketoconazole, traconazole, ritonavir, carbamazepine, phenytoin, rifampin, St. John's wort).  Reviewed labs.  SCr 0.75, Weight 133 lbs, CrCl 73 ml/min  . Dose is appropriate based on CrCl.   Hgb and HCT Within Normal Limits  A full discussion of the nature of anticoagulants has been carried out.  A benefit/risk analysis has been presented to the patient, so that they understand the justification for choosing anticoagulation with Eliquis at this time.  The need for compliance is stressed.  Pt is aware to take the medication twice daily.  Side effects of potential bleeding are discussed, including unusual colored urine or stools, coughing up blood or coffee ground emesis, nose bleeds or serious fall or head trauma.  Discussed signs and symptoms of stroke. The patient should avoid any OTC items containing aspirin or ibuprofen.  Avoid alcohol consumption.   Call if any signs of abnormal bleeding.  Discussed financial obligations and resolved any difficulty in obtaining medication.  Next lab test test in 6 months.

## 2013-02-05 ENCOUNTER — Ambulatory Visit (HOSPITAL_COMMUNITY): Payer: 59 | Attending: Physician Assistant | Admitting: Radiology

## 2013-02-05 VITALS — BP 115/67 | Ht 60.0 in | Wt 133.0 lb

## 2013-02-05 DIAGNOSIS — I4891 Unspecified atrial fibrillation: Secondary | ICD-10-CM | POA: Insufficient documentation

## 2013-02-05 DIAGNOSIS — I10 Essential (primary) hypertension: Secondary | ICD-10-CM | POA: Insufficient documentation

## 2013-02-05 DIAGNOSIS — Z8249 Family history of ischemic heart disease and other diseases of the circulatory system: Secondary | ICD-10-CM | POA: Insufficient documentation

## 2013-02-05 DIAGNOSIS — R0602 Shortness of breath: Secondary | ICD-10-CM

## 2013-02-05 DIAGNOSIS — Z87891 Personal history of nicotine dependence: Secondary | ICD-10-CM | POA: Insufficient documentation

## 2013-02-05 DIAGNOSIS — R079 Chest pain, unspecified: Secondary | ICD-10-CM | POA: Insufficient documentation

## 2013-02-05 MED ORDER — TECHNETIUM TC 99M SESTAMIBI GENERIC - CARDIOLITE
11.0000 | Freq: Once | INTRAVENOUS | Status: AC | PRN
  Administered 2013-02-05: 11 via INTRAVENOUS

## 2013-02-05 MED ORDER — TECHNETIUM TC 99M SESTAMIBI GENERIC - CARDIOLITE
33.0000 | Freq: Once | INTRAVENOUS | Status: AC | PRN
  Administered 2013-02-05: 33 via INTRAVENOUS

## 2013-02-05 NOTE — Progress Notes (Signed)
[facility] SITE 3 NUCLEAR MED [address] [place], [place] [address] [phone]    Cardiology Nuclear Med Study  Sheila Bruce is a 67 y.o. female     MRN : [id number]     DOB: 09-02-1946  Procedure Date: 02/05/2013  Nuclear Med Background  Indication for Stress Test:  Evaluation for Ischemia, and 01/12/13 [facility] ED: AFIB with RVR (new) History:  05-31-03 Myocardial Perfusion Study-No ischemia or scar, EF=73%,01-2013 Echo: EF=60-65%, mild LVH  Cardiac Risk Factors: Family History - CAD, History of Smoking, Hypertension and Lipids  Symptoms: Chest Pain/ChestPressure with/without exertion, and epigastric pain (last occurrence yesterday),  DOE, Fatigue, Light-Headedness, Palpitations, Rapid HR and SOB   Nuclear Pre-Procedure Caffeine/Decaff Intake:  None NPO After: 9:30pm   Lungs:  clear O2 Sat: 99% on room air. IV 0.9% NS with Angio Cath:  22g  IV Site: R Antecubital  IV Started by:  Bonnita Levan, RN  Chest Size (in):  38 Cup Size: D  Height: 5' (1.524 m)  Weight:  133 lb (60.328 kg)  BMI:  Body mass index is 25.97 kg/(m^2). Tech Comments:  Losartan this am, and Diltiazem last night. Irean Hong, RN    Nuclear Med Study 1 or 2 day study: 1 day  Stress Test Type:  Stress  Reading MD: Cassell Clement, MD  Order Authorizing Provider:  Tonny Bollman, MD, and Tereso Newcomer, [facility]  Resting Radionuclide: Technetium 59m Sestamibi  Resting Radionuclide Dose: 11.0 mCi   Stress Radionuclide:  Technetium 74m Sestamibi  Stress Radionuclide Dose: 33.0 mCi           Stress Protocol Rest HR: 52 Stress HR: 137  Rest BP: 115/67 Stress BP: 208/85  Exercise Time (min): 10:00 METS: 11.0   Predicted Max HR: 156 bpm % Max HR: 87.82 bpm Rate Pressure Product: 21308   Dose of Adenosine (mg):  n/a Dose of Lexiscan: n/a mg  Dose of Atropine (mg): n/a Dose of Dobutamine: n/a mcg/kg/min (at max HR)  Stress Test Technologist: Irean Hong, RN  Nuclear Technologist:  Domenic Polite,  CNMT     Rest Procedure:  Myocardial perfusion imaging was performed at rest 45 minutes following the intravenous administration of Technetium 63m Sestamibi. Rest ECG: NSR - Normal EKG  Stress Procedure:  The patient exercised on the treadmill utilizing the Bruce Protocol for 10:00 minutes, RPE=15. The patient stopped due to DOE, and complained of chest pressure 1/10 in recovery. There was a hypertensive response to exercise.  Technetium 37m Sestamibi was injected at peak exercise and myocardial perfusion imaging was performed after a brief delay. Stress ECG: No significant change from baseline ECG  QPS Raw Data Images:  Normal; no motion artifact; normal heart/lung ratio. Stress Images:  Normal homogeneous uptake in all areas of the myocardium. Rest Images:  Normal homogeneous uptake in all areas of the myocardium. Subtraction (SDS):  No evidence of ischemia. Transient Ischemic Dilatation (Normal <1.22):  1.04 Lung/Heart Ratio (Normal <0.45):  0.34  Quantitative Gated Spect Images QGS EDV:  56 ml QGS ESV:  12 ml  Impression Exercise Capacity:  Good exercise capacity. BP Response:  Hypertensive blood pressure response. Clinical Symptoms:  Mild chest pain/dyspnea. ECG Impression:  No significant ST segment change suggestive of ischemia. Comparison with Prior Nuclear Study: No images to compare  Overall Impression:  Normal stress nuclear study.  LV Ejection Fraction: 79%.  LV Wall Motion:  NL LV Function; NL Wall Motion   [employer]

## 2013-02-06 ENCOUNTER — Encounter: Payer: Self-pay | Admitting: Physician Assistant

## 2013-02-09 ENCOUNTER — Encounter: Payer: Self-pay | Admitting: Cardiovascular Disease

## 2013-02-09 NOTE — Telephone Encounter (Signed)
New problem ° ° ° °Pt calling regarding test results °

## 2013-02-09 NOTE — Telephone Encounter (Signed)
This encounter was created in error - please disregard.

## 2013-03-11 ENCOUNTER — Telehealth: Payer: Self-pay | Admitting: Cardiovascular Disease

## 2013-03-11 NOTE — Telephone Encounter (Signed)
New problem   Pt calling about FMLA papers

## 2013-03-11 NOTE — Telephone Encounter (Signed)
I spoke with Elease Hashimoto at [facility] and she does have the pt's FMLA forms.  Elease Hashimoto mailed out a packet to the pt to have the ROI signed and obtain payment before these forms can be completed.   Left message on machine for pt to contact the office.

## 2013-03-11 NOTE — Telephone Encounter (Signed)
I spoke with the pt and she did not remember seeing any documents from [place] in the mail .  I did give the pt the number for [employer] so that she can directly contact Elease Hashimoto.

## 2013-05-01 ENCOUNTER — Encounter: Payer: Self-pay | Admitting: Cardiovascular Disease

## 2013-05-01 ENCOUNTER — Ambulatory Visit (INDEPENDENT_AMBULATORY_CARE_PROVIDER_SITE_OTHER): Payer: 59 | Admitting: Cardiovascular Disease

## 2013-05-01 VITALS — BP 130/68 | HR 65 | Ht 60.0 in | Wt 130.1 lb

## 2013-05-01 DIAGNOSIS — I4891 Unspecified atrial fibrillation: Secondary | ICD-10-CM

## 2013-05-01 NOTE — Patient Instructions (Addendum)
Your physician wants you to follow-up in: 12 months.  You will receive a reminder letter in the mail two months in advance. If you don't receive a letter, please call our office to schedule the follow-up appointment.  Your physician recommends that you continue on your current medications as directed. Please refer to the Current Medication list given to you today.   

## 2013-05-01 NOTE — Progress Notes (Signed)
HPI:  67 year old woman presenting for followup evaluation. The patient has paroxysmal atrial fibrillation. She was hospitalized this spring with atrial fib with RVR. We thought this may have been related to heavy alcohol use. She has cut way back and is drinking no more than a drink per day. She feels good and is participating in regular exercise.She is on long-term anticoagulation. Her left ventricular function is preserved with an ejection fraction of 60-65%.she had a normal stress Myoview in May of this year.  She denies chest pain, chest pressure, or dyspnea. She doesn't have to palpitations, especially when fatigued or after caffeine. She has no feeling similar to those experienced with her rapid atrial fib.  She also admits to episodic gross hematuria. She has been evaluated by Dr. Laverle Patter. She has a CT scan scheduled today.  Outpatient Encounter Prescriptions as of 05/01/2013  Medication Sig Dispense Refill  . apixaban (ELIQUIS) 5 MG TABS tablet Take 1 tablet (5 mg total) by mouth 2 (two) times daily.  60 tablet  0  . atorvastatin (LIPITOR) 20 MG tablet Take 1 tablet (20 mg total) by mouth daily at 6 PM.  30 tablet  0  . diltiazem (CARDIZEM CD) 120 MG 24 hr capsule Take 1 capsule (120 mg total) by mouth at bedtime.      Marland Kitchen estrogens, conjugated, (PREMARIN) 0.3 MG tablet Take 0.3 mg by mouth.      . fluticasone (FLONASE) 50 MCG/ACT nasal spray Place 2 sprays into the nose daily.      Marland Kitchen ibuprofen (ADVIL) 200 MG tablet Take 200 mg by mouth as needed for pain.      Marland Kitchen loratadine (CLARITIN) 10 MG tablet Take 10 mg by mouth daily.      Marland Kitchen LOSARTAN POTASSIUM PO Take 50 mg by mouth daily.       . Multiple Vitamin (MULTIVITAMIN) tablet Take 1 tablet by mouth daily.       No facility-administered encounter medications on file as of 05/01/2013.    Allergies  Allergen Reactions  . Ciprocinonide (Fluocinolone) Rash  . Codeine Itching  . Ciprofloxacin   . Doxycycline Nausea And Vomiting    Past  Medical History  Diagnosis Date  . Hypertension   . Heart murmur   . Vertigo   . Hyperlipidemia   . Anxiety   . Atrial fibrillation     a. Echo 4/14: Mild concentric LVH, EF 60-65%, grade 1 diastolic dysfunction, trivial MR, PASP 29;  b. Eliquis 5 bid  . Hx of cardiovascular stress test     ETT-Myoview 5/14:  Normal; EF 79%, no ischemia, exercised 10:00    ROS: Negative except as per HPI  BP 130/68  Pulse 65  Ht 5' (1.524 m)  Wt 59.022 kg (130 lb 1.9 oz)  BMI 25.41 kg/m2  SpO2 99%  PHYSICAL EXAM: Pt is alert and oriented, NAD HEENT: normal Neck: JVP - normal, carotids 2+= without bruits Lungs: CTA bilaterally CV: RRR without murmur or gallop Abd: soft, NT, Positive BS, no hepatomegaly Ext: no C/C/E, distal pulses intact and equal Skin: warm/dry no rash  Exercise stress Myoview: QPS  Raw Data Images: Normal; no motion artifact; normal heart/lung ratio.  Stress Images: Normal homogeneous uptake in all areas of the myocardium.  Rest Images: Normal homogeneous uptake in all areas of the myocardium.  Subtraction (SDS): No evidence of ischemia.  Transient Ischemic Dilatation (Normal <1.22): 1.04  Lung/Heart Ratio (Normal <0.45): 0.34  Quantitative Gated Spect Images  QGS EDV: 56  ml  QGS ESV: 12 ml  Impression  Exercise Capacity: Good exercise capacity.  BP Response: Hypertensive blood pressure response.  Clinical Symptoms: Mild chest pain/dyspnea.  ECG Impression: No significant ST segment change suggestive of ischemia.  Comparison with Prior Nuclear Study: No images to compare  Overall Impression: Normal stress nuclear study.  LV Ejection Fraction: 79%. LV Wall Motion: NL LV Function; NL Wall Motion   ASSESSMENT AND PLAN: 1. Paroxysmal atrial fibrillation. The patient is maintaining sinus rhythm. She is anticoagulated with Eliquis. May need to revisit issues with long-term anticoagulation if hematuria becomes more of a problem. I will see her back in one year and asked  her to contact me if recurrent hematuria or if tablet are found on her CT scan. She understands the association between alcohol and atrial fibrillation and she is committed to staying away from heavy alcohol consumption. She will continue with her regular exercise.  2. Hypertension. Blood pressure well controlled on combination of losartan diltiazem.  3. Hyperlipidemia. The patient is taking atorvastatin 20 mg.lipids are followed by her primary care physician.  Tonny Bollman 05/01/2013 9:40 AM

## 2013-11-13 ENCOUNTER — Other Ambulatory Visit: Payer: Self-pay

## 2013-11-13 DIAGNOSIS — Z1231 Encounter for screening mammogram for malignant neoplasm of breast: Secondary | ICD-10-CM

## 2013-11-25 ENCOUNTER — Ambulatory Visit
Admission: RE | Admit: 2013-11-25 | Discharge: 2013-11-25 | Disposition: A | Discharge status: Home / Self Care | Payer: 59 | Source: Ambulatory Visit

## 2013-11-25 DIAGNOSIS — Z1231 Encounter for screening mammogram for malignant neoplasm of breast: Secondary | ICD-10-CM

## 2013-12-20 ENCOUNTER — Emergency Department (HOSPITAL_COMMUNITY)
Admission: EM | Admit: 2013-12-20 | Discharge: 2013-12-20 | Disposition: A | Discharge status: Home / Self Care | Payer: 59 | Source: Home / Self Care | Attending: Family Medicine | Admitting: Family Medicine

## 2013-12-20 ENCOUNTER — Encounter (HOSPITAL_COMMUNITY): Payer: Self-pay | Admitting: Emergency Medicine

## 2013-12-20 DIAGNOSIS — N390 Urinary tract infection, site not specified: Secondary | ICD-10-CM

## 2013-12-20 DIAGNOSIS — R300 Dysuria: Secondary | ICD-10-CM

## 2013-12-20 LAB — POCT URINALYSIS DIP (DEVICE)
Bilirubin Urine: NEGATIVE
Glucose, UA: NEGATIVE mg/dL
HGB URINE DIPSTICK: NEGATIVE
KETONES UR: NEGATIVE mg/dL
Nitrite: POSITIVE — AB
PH: 5.5 (ref 5.0–8.0)
Protein, ur: NEGATIVE mg/dL
Specific Gravity, Urine: 1.02 (ref 1.005–1.030)
Urobilinogen, UA: 0.2 mg/dL (ref 0.0–1.0)

## 2013-12-20 MED ORDER — NITROFURANTOIN MONOHYD MACRO 100 MG PO CAPS: 100.0000 mg | ORAL_CAPSULE | Freq: Two times a day (BID) | ORAL | Status: DC

## 2013-12-20 NOTE — ED Provider Notes (Signed)
Medical screening examination/treatment/procedure(s) were performed by resident physician or non-physician practitioner and as supervising physician I was immediately available for consultation/collaboration.   Pauline Good MD.   Billy Fischer, MD 12/20/13 1452

## 2013-12-20 NOTE — Discharge Instructions (Signed)
Urinary Tract Infection Urinary tract infections (UTIs) can develop anywhere along your urinary tract. Your urinary tract is your body's drainage system for removing wastes and extra water. Your urinary tract includes two kidneys, two ureters, a bladder, and a urethra. Your kidneys are a pair of bean-shaped organs. Each kidney is about the size of your fist. They are located below your ribs, one on each side of your spine. CAUSES Infections are caused by microbes, which are microscopic organisms, including fungi, viruses, and bacteria. These organisms are so small that they can only be seen through a microscope. Bacteria are the microbes that most commonly cause UTIs. SYMPTOMS  Symptoms of UTIs may vary by age and gender of the patient and by the location of the infection. Symptoms in [employer] women typically include a frequent and intense urge to urinate and a painful, burning feeling in the bladder or urethra during urination. Older women and men are more likely to be tired, shaky, and weak and have muscle aches and abdominal pain. A fever may mean the infection is in your kidneys. Other symptoms of a kidney infection include pain in your back or sides below the ribs, nausea, and vomiting. DIAGNOSIS To diagnose a UTI, your caregiver will ask you about your symptoms. Your caregiver also will ask to provide a urine sample. The urine sample will be tested for bacteria and white blood cells. White blood cells are made by your body to help fight infection. TREATMENT  Typically, UTIs can be treated with medication. Because most UTIs are caused by a bacterial infection, they usually can be treated with the use of antibiotics. The choice of antibiotic and length of treatment depend on your symptoms and the type of bacteria causing your infection. HOME CARE INSTRUCTIONS  If you were prescribed antibiotics, take them exactly as your caregiver instructs you. Finish the medication even if you feel better after you  have only taken some of the medication.  Drink enough water and fluids to keep your urine clear or pale yellow.  Avoid caffeine, tea, and carbonated beverages. They tend to irritate your bladder.  Empty your bladder often. Avoid holding urine for long periods of time.  Empty your bladder before and after sexual intercourse.  After a bowel movement, women should cleanse from front to back. Use each tissue only once. SEEK MEDICAL CARE IF:   You have back pain.  You develop a fever.  Your symptoms do not begin to resolve within 3 days. SEEK IMMEDIATE MEDICAL CARE IF:   You have severe back pain or lower abdominal pain.  You develop chills.  You have nausea or vomiting.  You have continued burning or discomfort with urination. MAKE SURE YOU:   Understand these instructions.  Will watch your condition.  Will get help right away if you are not doing well or get worse. Document Released: 07/04/2005 Document Revised: 03/25/2012 Document Reviewed: 11/02/2011 [facility] Patient Information 2014 [place].  Nice to meet you. Antibiotic has been sent to pharmacy. Push fluids and take all of antibiotics. Included a copy of your urinalysis for your PCP. Feel beter

## 2013-12-20 NOTE — ED Notes (Signed)
Reported syx of another UTI, onset syx 3-12. NAD at present

## 2013-12-20 NOTE — ED Provider Notes (Signed)
CSN: [id number]     Arrival date & time 12/20/13  1137 History   First MD Initiated Contact with Patient 12/20/13 1206     Chief Complaint  Patient presents with  . Urinary Tract Infection   (Consider location/radiation/quality/duration/timing/severity/associated sxs/prior Treatment) HPI Comments: Patient presents with a 3-4 day history of dysuria and spasms. No fever, chills, abdominal pain, N or Vomiting. No abdominal pain. Frequent UTI's per patient.   The history is provided by the patient.    Past Medical History  Diagnosis Date  . Hypertension   . Heart murmur   . Vertigo   . Hyperlipidemia   . Anxiety   . Atrial fibrillation     a. Echo 4/14: Mild concentric LVH, EF 66-44%, grade 1 diastolic dysfunction, trivial MR, PASP 29;  b. Eliquis 5 bid  . Hx of cardiovascular stress test     ETT-Myoview 5/14:  Normal; EF 79%, no ischemia, exercised 10:00   Past Surgical History  Procedure Laterality Date  . Abdominal hysterectomy    . Cystoscopy    . Laproscopy-abdominal    . Tendon repair      Left hand   Family History  Problem Relation Age of Onset  . Hypertension      Mother & father  . Hyperlipidemia      Mother & father  . Autoimmune disease      Mother (?liver) and sister, type unclear  . CAD      father had old MI on EKG  . CAD      multiple grandparents   History  Substance Use Topics  . Smoking status: Former Smoker    Quit date: 01/12/2005  . Smokeless tobacco: Never Used     Comment: Smoked for 35 yrs As of 01/2013 chews nicotine gum  . Alcohol Use: 1.2 oz/week    2 Glasses of wine per week     Comment: Daily, 2 glasses up to 1 bottle nightly.   OB History   Grav Para Term Preterm Abortions TAB SAB Ect Mult Living                 Review of Systems  All other systems reviewed and are negative.    Allergies  Ciprocinonide; Codeine; Ciprofloxacin; and Doxycycline  Home Medications   Current Outpatient Rx  Name  Route  Sig  Dispense  Refill   . apixaban (ELIQUIS) 5 MG TABS tablet   Oral   Take 1 tablet (5 mg total) by mouth 2 (two) times daily.   60 tablet   0   . atorvastatin (LIPITOR) 20 MG tablet   Oral   Take 1 tablet (20 mg total) by mouth daily at 6 PM.   30 tablet   0   . diltiazem (CARDIZEM CD) 120 MG 24 hr capsule   Oral   Take 1 capsule (120 mg total) by mouth at bedtime.         Marland Kitchen estrogens, conjugated, (PREMARIN) 0.3 MG tablet   Oral   Take 0.3 mg by mouth.         . fluticasone (FLONASE) 50 MCG/ACT nasal spray   Nasal   Place 2 sprays into the nose daily.         Marland Kitchen ibuprofen (ADVIL) 200 MG tablet   Oral   Take 200 mg by mouth as needed for pain.         Marland Kitchen loratadine (CLARITIN) 10 MG tablet   Oral   Take 10  mg by mouth daily.         Marland Kitchen LOSARTAN POTASSIUM PO   Oral   Take 50 mg by mouth daily.          . Multiple Vitamin (MULTIVITAMIN) tablet   Oral   Take 1 tablet by mouth daily.         . nitrofurantoin, macrocrystal-monohydrate, (MACROBID) 100 MG capsule   Oral   Take 1 capsule (100 mg total) by mouth 2 (two) times daily.   10 capsule   0    BP 151/76  Pulse 70  Temp(Src) 98.7 F (37.1 C) (Oral)  Resp 18  SpO2 100% Physical Exam  Nursing note and vitals reviewed. Constitutional: She is oriented to person, place, and time. She appears well-developed and well-nourished. No distress.  HENT:  Head: Normocephalic and atraumatic.  Neck: Normal range of motion.  Abdominal: Soft. There is no tenderness. There is no rebound and no guarding.  Neurological: She is alert and oriented to person, place, and time.  Skin: Skin is warm and dry. She is not diaphoretic.  Psychiatric: Her behavior is normal.    ED Course  Procedures (including critical care time) Labs Review Labs Reviewed  POCT URINALYSIS DIP (DEVICE) - Abnormal; Notable for the following:    Nitrite POSITIVE (*)    Leukocytes, UA TRACE (*)    All other components within normal limits   Imaging Review No  results found.   MDM   1. Urinary tract infection   2. Dysuria    Treat with Macrobid. Push fluids. F/U if worsens.    Bjorn Pippin, PA-C 12/20/13 1241

## 2014-05-28 ENCOUNTER — Ambulatory Visit: Payer: 59 | Admitting: Cardiovascular Disease

## 2014-07-08 ENCOUNTER — Telehealth: Payer: Self-pay | Admitting: Cardiovascular Disease

## 2014-07-08 NOTE — Telephone Encounter (Signed)
Spoke with patient who is very upset regarding her appointment with Dr. Burt Bruce for 10/29 cancelled due to a change in his schedule.  Patient states she needs to come in on this day - that she has already taken off work and is flying out of state frequently to settle her father's estate and works 12 hour shifts and cannot leave during those days.  I checked other possible dates that patient is available in November and Dr. Burt Bruce is not in the office any of those dates.  I advised patient that I will schedule her with one of our extenders on 10/29 at a time that Dr. Burt Bruce is in the office.  Patient's appointment scheduled with Sheila Copa, PA-C for 1:15.  I advised patient that I will forward message to Dr. Burt Bruce and his primary nurse, Sheila Bruce for agreement.

## 2014-07-08 NOTE — Telephone Encounter (Signed)
New problem   Pt need to speak to nurse because she is on bumplist for 08/05/14, pt stated she can't come in on 08/02/14 and she is very upset and want to speak to nurse. Please call pt.

## 2014-08-05 ENCOUNTER — Ambulatory Visit: Payer: 59 | Admitting: Cardiovascular Disease

## 2014-08-05 ENCOUNTER — Encounter: Payer: Self-pay | Admitting: Physician Assistant

## 2014-08-05 ENCOUNTER — Ambulatory Visit (INDEPENDENT_AMBULATORY_CARE_PROVIDER_SITE_OTHER): Payer: 59 | Admitting: Physician Assistant

## 2014-08-05 VITALS — BP 130/80 | HR 61 | Ht 60.0 in | Wt 129.4 lb

## 2014-08-05 DIAGNOSIS — F1090 Alcohol use, unspecified, uncomplicated: Secondary | ICD-10-CM | POA: Insufficient documentation

## 2014-08-05 DIAGNOSIS — Z87891 Personal history of nicotine dependence: Secondary | ICD-10-CM | POA: Insufficient documentation

## 2014-08-05 DIAGNOSIS — R319 Hematuria, unspecified: Secondary | ICD-10-CM | POA: Insufficient documentation

## 2014-08-05 DIAGNOSIS — I480 Paroxysmal atrial fibrillation: Secondary | ICD-10-CM | POA: Insufficient documentation

## 2014-08-05 DIAGNOSIS — Z7289 Other problems related to lifestyle: Secondary | ICD-10-CM | POA: Insufficient documentation

## 2014-08-05 DIAGNOSIS — E785 Hyperlipidemia, unspecified: Secondary | ICD-10-CM

## 2014-08-05 DIAGNOSIS — I10 Essential (primary) hypertension: Secondary | ICD-10-CM

## 2014-08-05 NOTE — Patient Instructions (Signed)
Your physician recommends that you continue on your current medications as directed. Please refer to the Current Medication list given to you today.   Your physician wants you to follow-up in: DR Addison will receive a reminder letter in the mail two months in advance. If you don't receive a letter, please call our office to schedule the follow-up appointment.      PLEASE HAVE YOU PCP FAX A COPY OF LABS BMET AND CBC WHEN YOU HAVE YOUR PHYSICAL  PER DANA DUNN

## 2014-08-05 NOTE — Progress Notes (Signed)
[place], [place], [place]  [address] Phone: [phone] Fax:  [phone]  Date:  08/05/2014   Patient ID:  Sheila Bruce, Sheila Bruce 03/05/1946, MRN [id number]   PCP:  Tawanna Solo, MD  Cardiologist:  Dr. Burt Knack  History of Present Illness: Sheila Bruce is a 68 y.o. female with history of PAF, HTN, HL who presents to clinic for follow-up. She was previously hospitalized in the spring of 2014 for AF RVR in the context of heavy alcohol use. She has since made efforts to cut down. She had had brief palpitations subsequent to that admission (according to her first post-hospital follow-up at that time), but denies any known recurrence recently. 2D echo 01/2013: EF 60-65%, mod LVH, grade 1 d/d, no significant valvular disease. Nuc 02/2013: normal, EF 79%. She now drinks on average less than 1 drink a day during the week, but sometimes 2-3 on the weekend. She realized wine and hard liquor were triggers for unhealthy alcohol use so now sticks to an occasional beer. She denies CP, SOB, palpitations, nausea, or syncope.  The last time she saw Dr. Burt Knack in 04/2013 she was having hematuria. She reports she saw urology and had a comprehensive workup including CT and cystoscopy which were negative for bladder cancer. She says they think it is due to bladder irritation, less likely infection. She has not had recent issues with bleeding.   Recent Labs: No results found for requested labs within last 365 days.  Wt Readings from Last 3 Encounters:  08/05/14 129 lb 6.4 oz (58.695 kg)  05/01/13 130 lb 1.9 oz (59.022 kg)  02/05/13 133 lb (60.328 kg)     Past Medical History  Diagnosis Date  . Hypertension   . Heart murmur   . Vertigo   . Hyperlipidemia   . Anxiety   . PAF (paroxysmal atrial fibrillation)     a. Echo 4/14: Mild concentric LVH, EF 19-41%, grade 1 diastolic dysfunction, trivial MR, PASP 29;  b. Eliquis 5 bid  . Hx of cardiovascular stress test     a. ETT-Myoview  5/14:  Normal; EF 79%, no ischemia, exercised 10:00.  . Habitual alcohol use     Current Outpatient Prescriptions  Medication Sig Dispense Refill  . apixaban (ELIQUIS) 5 MG TABS tablet Take 1 tablet (5 mg total) by mouth 2 (two) times daily.  60 tablet  0  . atorvastatin (LIPITOR) 20 MG tablet Take 1 tablet (20 mg total) by mouth daily at 6 PM.  30 tablet  0  . diltiazem (CARDIZEM CD) 120 MG 24 hr capsule Take 1 capsule (120 mg total) by mouth at bedtime.      . fluticasone (FLONASE) 50 MCG/ACT nasal spray Place 2 sprays into the nose daily.      Marland Kitchen ibuprofen (ADVIL) 200 MG tablet Take 200 mg by mouth as needed for pain.      Marland Kitchen loratadine (CLARITIN) 10 MG tablet Take 10 mg by mouth daily.      Marland Kitchen LOSARTAN POTASSIUM PO Take 50 mg by mouth daily.       . Multiple Vitamin (MULTIVITAMIN) tablet Take 1 tablet by mouth daily.       No current facility-administered medications for this visit.    Allergies:   Ciprocinonide; Codeine; Ciprofloxacin; and Doxycycline   Social History:  The patient  reports that she quit smoking about 9 years ago. She has never used smokeless tobacco. She reports that she drinks about .  6 ounces of alcohol per week. She reports that she does not use illicit drugs.   Family History:  The patient's family history includes Autoimmune disease in an other family member; CAD in some other family members; Hyperlipidemia in an other family member; Hypertension in an other family member.   ROS:  Please see the history of present illness.   All other systems reviewed and negative.   PHYSICAL EXAM:  VS:  BP 130/80  Pulse 61  Ht 5' (1.524 m)  Wt 129 lb 6.4 oz (58.695 kg)  BMI 25.27 kg/m2 Well nourished, well developed WF, in no acute distress HEENT: normal Neck: no JVD, no carotid bruits Cardiac:  normal S1, S2; RRR; soft SEM RUSB Lungs:  clear to auscultation bilaterally, no wheezing, rhonchi or rales Abd: soft, nontender, no hepatomegaly Ext: no edema Skin: warm and  dry Neuro:  moves all extremities spontaneously, no focal abnormalities noted  EKG:  NSR 61bpm, TWI in lead avL, no acute ST-T changes   ASSESSMENT AND PLAN:  1. PAF - no clinical recurrence. Continue current regimen including Eliquis for CHADSVASC = 3. She is having her physical done in just a few weeks at which time she'll be having updated bloodwork. I asked her to please have her PCP send [place] an updated copy of her CBC and BMET to make sure these are stable and appropriate. 2. HTN - controlled. 3. Hyperlipidemia - lipids followed by PCP. 4. Heart murmur - since childhood. Echo in 2014 without pathologic abnormality. 5. Hematuria - deemed benign by urology, possibly due to bladder irritation. No recent significant bleeding. 6. Habitual alcohol use - she continues to make efforts to cut down and tries to avoid her trigger of wine. We discussed the importance of cutting down further to hopefully help reduced risk of recurrent problems with AF.  Dispo: F/u Dr. Burt Knack in 1 year.  Signed, Melina Copa, PA-C  08/05/2014 2:08 PM

## 2014-11-29 ENCOUNTER — Other Ambulatory Visit: Payer: Self-pay

## 2014-11-29 DIAGNOSIS — Z1231 Encounter for screening mammogram for malignant neoplasm of breast: Secondary | ICD-10-CM

## 2014-12-24 ENCOUNTER — Ambulatory Visit
Admission: RE | Admit: 2014-12-24 | Discharge: 2014-12-24 | Disposition: A | Discharge status: Home / Self Care | Payer: 59 | Source: Ambulatory Visit

## 2014-12-24 DIAGNOSIS — Z1231 Encounter for screening mammogram for malignant neoplasm of breast: Secondary | ICD-10-CM

## 2015-06-18 ENCOUNTER — Emergency Department (HOSPITAL_COMMUNITY): Payer: Medicare Other

## 2015-06-18 ENCOUNTER — Emergency Department (HOSPITAL_COMMUNITY)
Admission: EM | Admit: 2015-06-18 | Discharge: 2015-06-18 | Disposition: A | Discharge status: Home / Self Care | Payer: Medicare Other | Attending: Emergency Medicine | Admitting: Emergency Medicine

## 2015-06-18 ENCOUNTER — Encounter (HOSPITAL_COMMUNITY): Payer: Self-pay | Admitting: *Deleted

## 2015-06-18 DIAGNOSIS — Z7901 Long term (current) use of anticoagulants: Secondary | ICD-10-CM | POA: Diagnosis not present

## 2015-06-18 DIAGNOSIS — I10 Essential (primary) hypertension: Secondary | ICD-10-CM | POA: Diagnosis not present

## 2015-06-18 DIAGNOSIS — I480 Paroxysmal atrial fibrillation: Secondary | ICD-10-CM | POA: Diagnosis not present

## 2015-06-18 DIAGNOSIS — Z79899 Other long term (current) drug therapy: Secondary | ICD-10-CM | POA: Diagnosis not present

## 2015-06-18 DIAGNOSIS — Z87891 Personal history of nicotine dependence: Secondary | ICD-10-CM | POA: Diagnosis not present

## 2015-06-18 DIAGNOSIS — R011 Cardiac murmur, unspecified: Secondary | ICD-10-CM | POA: Insufficient documentation

## 2015-06-18 DIAGNOSIS — R531 Weakness: Secondary | ICD-10-CM | POA: Diagnosis not present

## 2015-06-18 DIAGNOSIS — F419 Anxiety disorder, unspecified: Secondary | ICD-10-CM | POA: Diagnosis not present

## 2015-06-18 DIAGNOSIS — Z7951 Long term (current) use of inhaled steroids: Secondary | ICD-10-CM | POA: Insufficient documentation

## 2015-06-18 DIAGNOSIS — E785 Hyperlipidemia, unspecified: Secondary | ICD-10-CM | POA: Insufficient documentation

## 2015-06-18 LAB — CBC
HCT: 40.6 % (ref 36.0–46.0)
Hemoglobin: 13.5 g/dL (ref 12.0–15.0)
MCH: 32.1 pg (ref 26.0–34.0)
MCHC: 33.3 g/dL (ref 30.0–36.0)
MCV: 96.4 fL (ref 78.0–100.0)
PLATELETS: 238 10*3/uL (ref 150–400)
RBC: 4.21 MIL/uL (ref 3.87–5.11)
RDW: 13.9 % (ref 11.5–15.5)
WBC: 6.8 10*3/uL (ref 4.0–10.5)

## 2015-06-18 LAB — BASIC METABOLIC PANEL
Anion gap: 10 (ref 5–15)
BUN: 18 mg/dL (ref 6–20)
CHLORIDE: 105 mmol/L (ref 101–111)
CO2: 25 mmol/L (ref 22–32)
CREATININE: 0.71 mg/dL (ref 0.44–1.00)
Calcium: 10 mg/dL (ref 8.9–10.3)
GFR calc non Af Amer: >60 mL/min (ref >60)
GLUCOSE: 110 mg/dL — AB (ref 65–99)
Potassium: 3.3 mmol/L — ABNORMAL LOW (ref 3.5–5.1)
Sodium: 140 mmol/L (ref 135–145)

## 2015-06-18 LAB — I-STAT TROPONIN, ED: Troponin i, poc: 0 ng/mL (ref 0.00–0.08)

## 2015-06-18 MED ORDER — POTASSIUM CHLORIDE CRYS ER 20 MEQ PO TBCR
40.0000 meq | EXTENDED_RELEASE_TABLET | Freq: Once | ORAL | Status: AC
  Administered 2015-06-18: 40 meq via ORAL
  Filled 2015-06-18: qty 2

## 2015-06-18 MED ORDER — LOSARTAN POTASSIUM 50 MG PO TABS
50.0000 mg | ORAL_TABLET | Freq: Once | ORAL | Status: AC
  Administered 2015-06-18: 50 mg via ORAL
  Filled 2015-06-18: qty 1

## 2015-06-18 NOTE — ED Provider Notes (Signed)
CSN: [id number]     Arrival date & time 06/18/15  2115 History   First MD Initiated Contact with Patient 06/18/15 2151     Chief Complaint  Patient presents with  . Chest Pain  . Shortness of Breath  . Weakness     (Consider location/radiation/quality/duration/timing/severity/associated sxs/prior Treatment) HPI  69 year old female who states that she has had some weakness and palpitations earlier today. States that she was working in the yard and high ambient temperatures. She then began feeling somewhat generally weak and felt like she was having palpitations. She went into clinical shower rested felt better. She began working around the house and again had some symptoms of palpitations and feeling shaky. She took her blood pressure at home and noted it was significantly elevated. Her ears blood pressure usually runs under 654 systolically. She denies any chest pain to me. She is not dyspneic and has had not had shortness of breath when I ask her. This is in contradiction to the nurses notes.  Past Medical History  Diagnosis Date  . Hypertension   . Heart murmur   . Vertigo   . Hyperlipidemia   . Anxiety   . PAF (paroxysmal atrial fibrillation)     a. Echo 4/14: Mild concentric LVH, EF 65-03%, grade 1 diastolic dysfunction, trivial MR, PASP 29;  b. Eliquis 5 bid  . Hx of cardiovascular stress test     a. ETT-Myoview 5/14:  Normal; EF 79%, no ischemia, exercised 10:00.  . Habitual alcohol use    Past Surgical History  Procedure Laterality Date  . Abdominal hysterectomy    . Cystoscopy    . Laproscopy-abdominal    . Tendon repair      Left hand   Family History  Problem Relation Age of Onset  . Hypertension      Mother & father  . Hyperlipidemia      Mother & father  . Autoimmune disease      Mother (?liver) and sister, type unclear  . CAD      father had old MI on EKG  . CAD      multiple grandparents   Social History  Substance Use Topics  . Smoking status: Former  Smoker    Quit date: 01/12/2005  . Smokeless tobacco: Never Used     Comment: Smoked for 35 yrs - As of 01/2013 chews nicotine gum  . Alcohol Use: 0.6 oz/week    1 Glasses of wine per week     Comment: Occasional use now - less than one per day during week, Occasional 2-3 on the weekend.   OB History    No data available     Review of Systems  All other systems reviewed and are negative.     Allergies  Ciprocinonide; Codeine; Ciprofloxacin; and Doxycycline  Home Medications   Prior to Admission medications   Medication Sig Start Date End Date Taking? Authorizing Provider  apixaban (ELIQUIS) 5 MG TABS tablet Take 1 tablet (5 mg total) by mouth 2 (two) times daily. 01/13/13   Hosie Poisson, MD  atorvastatin (LIPITOR) 20 MG tablet Take 1 tablet (20 mg total) by mouth daily at 6 PM. 01/13/13   Hosie Poisson, MD  diltiazem (CARDIZEM CD) 120 MG 24 hr capsule Take 1 capsule (120 mg total) by mouth at bedtime. 01/22/13   Liliane Shi, PA-C  fluticasone (FLONASE) 50 MCG/ACT nasal spray Place 2 sprays into the nose daily.    Historical Provider, MD  ibuprofen (ADVIL)  200 MG tablet Take 200 mg by mouth as needed for pain.    Historical Provider, MD  loratadine (CLARITIN) 10 MG tablet Take 10 mg by mouth daily.    Historical Provider, MD  losartan (COZAAR) 50 MG tablet Take 50 mg by mouth daily. 04/08/15   Historical Provider, MD  Multiple Vitamin (MULTIVITAMIN) tablet Take 1 tablet by mouth daily.    Historical Provider, MD   BP 223/98 mmHg  Pulse 77  Temp(Src) 98.3 F (36.8 C) (Oral)  Resp 20  Ht 5' (1.524 m)  Wt 137 lb (62.143 kg)  BMI 26.76 kg/m2  SpO2 96% Physical Exam  Constitutional: She is oriented to person, place, and time. She appears well-developed and well-nourished.  Hypertension  HENT:  Head: Normocephalic and atraumatic.  Right Ear: External ear normal.  Left Ear: External ear normal.  Nose: Nose normal.  Mouth/Throat: Oropharynx is clear and moist.  Eyes: Conjunctivae  and EOM are normal. Pupils are equal, round, and reactive to light.  Neck: Normal range of motion. Neck supple.  Cardiovascular: Normal rate, regular rhythm, normal heart sounds and intact distal pulses.   Pulmonary/Chest: Effort normal and breath sounds normal.  Abdominal: Soft. Bowel sounds are normal.  Musculoskeletal: Normal range of motion.  Neurological: She is alert and oriented to person, place, and time. She has normal reflexes.  Skin: Skin is warm and dry.  Psychiatric: She has a normal mood and affect. Her behavior is normal. Judgment and thought content normal.  Nursing note and vitals reviewed.   ED Course  Procedures (including critical care time) Labs Review Labs Reviewed  BASIC METABOLIC PANEL - Abnormal; Notable for the following:    Potassium 3.3 (*)    Glucose, Bld 110 (*)    All other components within normal limits  CBC  I-STAT TROPOININ, ED    Imaging Review No results found. I have personally reviewed and evaluated these images and lab results as part of my medical decision-making.   EKG Interpretation   Date/Time:  Saturday June 18 2015 21:31:47 EDT Ventricular Rate:  71 PR Interval:  158 QRS Duration: 84 QT Interval:  421 QTC Calculation: 457 R Axis:   32 Text Interpretation:  Normal sinus rhythm No significant change since last  tracing Confirmed by Fiore Detjen MD, Andee Poles ([id number]) on 06/18/2015 9:52:24 PM      MDM  Patient with palpitations and hypertension. She has a long-standing history of hypertension. She was very hypertensive on arrival with initial blood pressure of 223/98. However, without intervention blood pressure come down to 185/69. Her blood pressure medicines on a regular basis. She received an additional dose of her losartan here. He was mildly hypokalemic and reviewed to see by mouth potassium here. I-STAT troponin is negative. Remainder of labs are all essentially normal. She feels improved here. She was very concerned regarding  hypertension and felt this may been causing her symptoms. She is advised to follow-up with her primary care physician on Monday. Final diagnoses:  Essential hypertension  Weakness        Pattricia Boss, MD 06/18/15 2322

## 2015-06-18 NOTE — ED Notes (Signed)
Pt discharged to home

## 2015-06-18 NOTE — Discharge Instructions (Signed)
Please drink plenty of fluids. Avoid strenuous work in the heat. Recheck with your doctor as soon as possible. Return if you have any symptoms of dyspnea or shortness of breath or feel worse anytime.

## 2015-06-18 NOTE — ED Notes (Signed)
Pt states she was working in yard this morning around 1130 and became weak and chest pounding, so she went in and took a shower and rested, felt some better then started about 2 hours ago with chest pounding,  shakey feeling,  Sob and throat tightness

## 2015-07-29 ENCOUNTER — Emergency Department (HOSPITAL_COMMUNITY)
Admission: EM | Admit: 2015-07-29 | Discharge: 2015-07-29 | Disposition: A | Discharge status: Home / Self Care | Payer: Self-pay | Source: Home / Self Care

## 2015-08-05 ENCOUNTER — Emergency Department (INDEPENDENT_AMBULATORY_CARE_PROVIDER_SITE_OTHER)
Admission: EM | Admit: 2015-08-05 | Discharge: 2015-08-05 | Disposition: A | Discharge status: Home / Self Care | Payer: Medicare Other | Source: Home / Self Care | Attending: Family Medicine | Admitting: Family Medicine

## 2015-08-05 ENCOUNTER — Other Ambulatory Visit (HOSPITAL_COMMUNITY)
Admission: RE | Admit: 2015-08-05 | Discharge: 2015-08-05 | Disposition: A | Discharge status: Home / Self Care | Payer: Medicare Other | Source: Ambulatory Visit | Attending: Family Medicine | Admitting: Family Medicine

## 2015-08-05 ENCOUNTER — Encounter (HOSPITAL_COMMUNITY): Payer: Self-pay | Admitting: Emergency Medicine

## 2015-08-05 DIAGNOSIS — N390 Urinary tract infection, site not specified: Secondary | ICD-10-CM

## 2015-08-05 MED ORDER — CEPHALEXIN 500 MG PO CAPS: 500.0000 mg | ORAL_CAPSULE | Freq: Four times a day (QID) | ORAL | Status: DC

## 2015-08-05 NOTE — ED Notes (Signed)
C/o poss UTI sx onset 5 days associated w/dysuria, abd pressure; hx of freq UTIs  Denies fevers, chills, heamaturia A&O x4... No acute disterss

## 2015-08-05 NOTE — Discharge Instructions (Signed)

## 2015-08-05 NOTE — ED Provider Notes (Signed)
CSN: [id number]     Arrival date & time 08/05/15  1324 History   First MD Initiated Contact with Patient 08/05/15 1337     Chief Complaint  Patient presents with  . Urinary Tract Infection   (Consider location/radiation/quality/duration/timing/severity/associated sxs/prior Treatment) HPI Comments: 69 year old female complaining of urinary urgency, dysuria and suprapubic pressure for 5 days. These are typical symptoms for her UTIs. She denies urinary frequency. Remainder of her review of systems are negative.   Past Medical History  Diagnosis Date  . Hypertension   . Heart murmur   . Vertigo   . Hyperlipidemia   . Anxiety   . PAF (paroxysmal atrial fibrillation) ([place])     a. Echo 4/14: Mild concentric LVH, EF 09-32%, grade 1 diastolic dysfunction, trivial MR, PASP 29;  b. Eliquis 5 bid  . Hx of cardiovascular stress test     a. ETT-Myoview 5/14:  Normal; EF 79%, no ischemia, exercised 10:00.  . Habitual alcohol use    Past Surgical History  Procedure Laterality Date  . Abdominal hysterectomy    . Cystoscopy    . Laproscopy-abdominal    . Tendon repair      Left hand   Family History  Problem Relation Age of Onset  . Hypertension      Mother & father  . Hyperlipidemia      Mother & father  . Autoimmune disease      Mother (?liver) and sister, type unclear  . CAD      father had old MI on EKG  . CAD      multiple grandparents   Social History  Substance Use Topics  . Smoking status: Former Smoker    Quit date: 01/12/2005  . Smokeless tobacco: Never Used     Comment: Smoked for 35 yrs - As of 01/2013 chews nicotine gum  . Alcohol Use: 0.6 oz/week    1 Glasses of wine per week     Comment: Occasional use now - less than one per day during week, Occasional 2-3 on the weekend.   OB History    No data available     Review of Systems  Constitutional: Negative.   Genitourinary: Positive for dysuria, urgency and pelvic pain. Negative for frequency, flank pain,  vaginal bleeding, vaginal discharge, vaginal pain and menstrual problem.  All other systems reviewed and are negative.   Allergies  Ciprocinonide; Codeine; Ciprofloxacin; and Doxycycline  Home Medications   Prior to Admission medications   Medication Sig Start Date End Date Taking? Authorizing Provider  apixaban (ELIQUIS) 5 MG TABS tablet Take 1 tablet (5 mg total) by mouth 2 (two) times daily. 01/13/13  Yes Hosie Poisson, MD  atorvastatin (LIPITOR) 20 MG tablet Take 1 tablet (20 mg total) by mouth daily at 6 PM. 01/13/13  Yes Hosie Poisson, MD  diltiazem (CARDIZEM CD) 120 MG 24 hr capsule Take 1 capsule (120 mg total) by mouth at bedtime. 01/22/13  Yes Scott Joylene Draft, PA-C  loratadine (CLARITIN) 10 MG tablet Take 10 mg by mouth daily.   Yes Historical Provider, MD  losartan (COZAAR) 50 MG tablet Take 50 mg by mouth daily. 04/08/15  Yes Historical Provider, MD  Multiple Vitamin (MULTIVITAMIN) tablet Take 1 tablet by mouth daily.   Yes Historical Provider, MD  cephALEXin (KEFLEX) 500 MG capsule Take 1 capsule (500 mg total) by mouth 4 (four) times daily. 08/05/15   Janne Napoleon, NP  fluticasone (FLONASE) 50 MCG/ACT nasal spray Place 2 sprays into the nose  daily.    Historical Provider, MD  ibuprofen (ADVIL) 200 MG tablet Take 200 mg by mouth as needed for pain.    Historical Provider, MD   Meds Ordered and Administered this Visit  Medications - No data to display  BP 149/79 mmHg  Pulse 70  Temp(Src) 98.6 F (37 C) (Oral)  Resp 16  SpO2 99% No data found.   Physical Exam  Constitutional: She is oriented to person, place, and time. She appears well-developed and well-nourished. No distress.  Eyes: EOM are normal.  Neck: Normal range of motion. Neck supple.  Cardiovascular: Normal rate.   Pulmonary/Chest: Effort normal. No respiratory distress.  Musculoskeletal: She exhibits no edema.  Neurological: She is alert and oriented to person, place, and time. She exhibits normal muscle tone.   Skin: Skin is warm and dry.  Psychiatric: She has a normal mood and affect.  Nursing note and vitals reviewed.   ED Course  Procedures (including critical care time)  Labs Review Labs Reviewed  URINE CULTURE    Imaging Review No results found.   Visual Acuity Review  Right Eye Distance:   Left Eye Distance:   Bilateral Distance:    Right Eye Near:   Left Eye Near:    Bilateral Near:         MDM   1. UTI (lower urinary tract infection)     Urinalysis dipstick was performed by visualization and compare. Leukocytes and nitrites are elevated. Positive for protein. Culture will be performed. Keflex as directed Lots of water    Janne Napoleon, NP 08/05/15 1422

## 2015-08-08 LAB — URINE CULTURE: Special Requests: NORMAL

## 2015-08-08 NOTE — ED Notes (Signed)
Final report of urine C&S shows organism sensitive to Rx provided day of visit

## 2015-08-25 ENCOUNTER — Other Ambulatory Visit: Payer: Self-pay | Admitting: Acute Care

## 2015-08-25 DIAGNOSIS — Z87891 Personal history of nicotine dependence: Secondary | ICD-10-CM

## 2015-09-07 ENCOUNTER — Encounter: Payer: Self-pay | Admitting: Acute Care

## 2015-09-07 ENCOUNTER — Ambulatory Visit (INDEPENDENT_AMBULATORY_CARE_PROVIDER_SITE_OTHER): Payer: Medicare Other | Admitting: Acute Care

## 2015-09-07 ENCOUNTER — Ambulatory Visit
Admission: RE | Admit: 2015-09-07 | Discharge: 2015-09-07 | Disposition: A | Discharge status: Home / Self Care | Payer: Medicare Other | Source: Ambulatory Visit | Attending: Acute Care | Admitting: Acute Care

## 2015-09-07 DIAGNOSIS — Z87891 Personal history of nicotine dependence: Secondary | ICD-10-CM

## 2015-09-07 DIAGNOSIS — F17210 Nicotine dependence, cigarettes, uncomplicated: Secondary | ICD-10-CM

## 2015-09-07 NOTE — Progress Notes (Signed)
Shared Decision Making Visit Lung Cancer Screening Program [phone])   Eligibility:  Age 69 y.o.  Pack Years Smoking History Calculation: 64.5 pack years. (# packs/per year x # years smoked)  Recent History of coughing up blood  no  Unexplained weight loss? no ( >Than 15 pounds within the last 6 months )  Prior History Lung / other cancer no (Diagnosis within the last 5 years already requiring surveillance chest CT Scans).  Smoking Status Current Smoker Former Smokers: NA Years since quit: NA; current smoker  Quit Date: NA  Visit Components:  Discussion included one or more decision making aids. yes  Discussion included risk/benefits of screening. yes  Discussion included potential follow up diagnostic testing for abnormal scans. yes  Discussion included meaning and risk of over diagnosis. yes  Discussion included meaning and risk of False Positives. yes  Discussion included meaning of total radiation exposure. yes  Counseling Included:  Importance of adherence to annual lung cancer LDCT screening. yes  Impact of comorbidities on ability to participate in the program. yes  Ability and willingness to under diagnostic treatment. yes  Smoking Cessation Counseling:  Current Smokers:   Discussed importance of smoking cessation. yes  Information about tobacco cessation classes and interventions provided to patient. yes  Patient provided with "ticket" for LDCT Scan. yes  Symptomatic Patient. no  Counseling:NA  Diagnosis Code: Tobacco Use Z72.0  Asymptomatic Patient yes  Counseling (Intermediate counseling: > three minutes counseling) [id number]  Former Smokers:   Discussed the importance of maintaining cigarette abstinence.NA; current smoker  Diagnosis Code: Personal History of Nicotine Dependence. [id number]  Information about tobacco cessation classes and interventions provided to patient. Yes  Patient provided with "ticket" for LDCT Scan. yes  Written Order  for Lung Cancer Screening with LDCT placed in Epic. Yes (CT Chest Lung Cancer Screening Low Dose W/O CM) [id number] Z12.2-Screening of respiratory organs Z87.891-Personal history of nicotine dependence  I spent 15 minutes of face to face time with Sheila Bruce discussing the risks and benefits of lung cancer screening. We viewed a power point together that explained the above noted topics in detail, pausing at intervals to allow for questions to be asked and answered to ensure understanding.We discussed that the single most powerful action that she has  taken to decrease her risk of lung cancer was  to quit smoking. She quit in 2006, and while she does still use the nicotine gum at times, she has not smoked since 2006. I congratulated her on her continued success. I did give her a " Be stronger than your excuses " card and pointed out the resources in the community to help her remain smoke free. We discussed the time and location of her scan and that either [place] or I would call her with the results within 24-48 hours of receiving them. I have given her a copy of the power point we viewed as a resource,  in addition to my card and contact information and encouraged her to call me if she has any further questions. Sheila Bruce has verbalized understanding of all of the above and had no further questions for me upon leaving the office.  Magdalen Spatz, NP

## 2015-09-19 ENCOUNTER — Encounter: Payer: Self-pay | Admitting: Cardiovascular Disease

## 2015-09-19 ENCOUNTER — Ambulatory Visit (INDEPENDENT_AMBULATORY_CARE_PROVIDER_SITE_OTHER): Payer: Medicare Other | Admitting: Cardiovascular Disease

## 2015-09-19 VITALS — BP 150/70 | HR 69 | Ht 60.0 in | Wt 135.8 lb

## 2015-09-19 DIAGNOSIS — R002 Palpitations: Secondary | ICD-10-CM

## 2015-09-19 DIAGNOSIS — I480 Paroxysmal atrial fibrillation: Secondary | ICD-10-CM | POA: Diagnosis not present

## 2015-09-19 NOTE — Progress Notes (Signed)
Cardiology Office Note Date:  09/19/2015   ID:  Sheila Bruce, DOB 01/02/1946, MRN [id number]  PCP:  Tawanna Solo, MD  Cardiologist:  Sherren Mocha, MD    Chief Complaint  Patient presents with  . Palpitations   History of Present Illness: Sheila Bruce is a 69 y.o. female who presents for follow-up of atrial fibrillation. The patient was initially diagnosed with atrial fibrillation in 2014 when she was hospitalized with atrial fibrillation RVR. This occurred in the setting of heavy alcohol use. She has subsequently done well in follow-up. CHADS-Vasc = 3. Has been maintained on Eliquis without bleeding problems. Most recent EKG from 06/18/2015 showed normal sinus rhythm.  The patient has been doing well. Her exercise is limited because of problems related to arthritis. She has not had any cardiopulmonary symptoms associated with physical exertion. She denies chest pain, chest pressure, shortness of breath, or leg swelling. She has occasional heart palpitations that feel like "PVCs." She has cut back on alcohol. She follows a low-sodium diet. She checks her blood pressure about 2 times per week and has consistently less than 140/90. Losartan was increased a few months ago because of elevated blood pressure, but since the medication change she reports good control.  Past Medical History  Diagnosis Date  . Hypertension   . Heart murmur   . Vertigo   . Hyperlipidemia   . Anxiety   . PAF (paroxysmal atrial fibrillation) ([place])     a. Echo 4/14: Mild concentric LVH, EF 123456, grade 1 diastolic dysfunction, trivial MR, PASP 29;  b. Eliquis 5 bid  . Hx of cardiovascular stress test     a. ETT-Myoview 5/14:  Normal; EF 79%, no ischemia, exercised 10:00.  . Habitual alcohol use     Past Surgical History  Procedure Laterality Date  . Abdominal hysterectomy    . Cystoscopy    . Laproscopy-abdominal    . Tendon repair      Left hand    Current Outpatient Prescriptions    Medication Sig Dispense Refill  . apixaban (ELIQUIS) 5 MG TABS tablet Take 1 tablet (5 mg total) by mouth 2 (two) times daily. 60 tablet 0  . atorvastatin (LIPITOR) 20 MG tablet Take 1 tablet (20 mg total) by mouth daily at 6 PM. 30 tablet 0  . cephALEXin (KEFLEX) 500 MG capsule Take 1 capsule (500 mg total) by mouth 4 (four) times daily. 28 capsule 0  . conjugated estrogens (PREMARIN) vaginal cream Place 1 Applicatorful vaginally as needed.    . diltiazem (CARDIZEM CD) 120 MG 24 hr capsule Take 1 capsule (120 mg total) by mouth at bedtime.    . fluticasone (FLONASE) 50 MCG/ACT nasal spray Place 2 sprays into the nose daily.    Marland Kitchen ibuprofen (ADVIL) 200 MG tablet Take 200 mg by mouth as needed for pain.    Marland Kitchen loratadine (CLARITIN) 10 MG tablet Take 10 mg by mouth daily.    Marland Kitchen losartan (COZAAR) 100 MG tablet Take 100 mg by mouth daily.    . Multiple Vitamin (MULTIVITAMIN) tablet Take 1 tablet by mouth daily.     No current facility-administered medications for this visit.    Allergies:   Ciprocinonide; Codeine; Ciprofloxacin; and Doxycycline   Social History:  The patient  reports that she quit smoking about 10 years ago. She has never used smokeless tobacco. She reports that she drinks about 0.6 oz of alcohol per week. She reports that she does not use illicit drugs.  Family History:  The patient's  family history includes Autoimmune disease in an other family member; CAD in some other family members; Hyperlipidemia in an other family member; Hypertension in an other family member.    ROS:  Please see the history of present illness.  Otherwise, review of systems is positive for hearing loss, back pain, palpitations, snoring, anxiety, balance problems.  All other systems are reviewed and negative.    PHYSICAL EXAM: VS:  BP 150/70 mmHg  Pulse 69  Ht 5' (1.524 m)  Wt 135 lb 12 oz (61.576 kg)  BMI 26.51 kg/m2 , BMI Body mass index is 26.51 kg/(m^2). GEN: Well nourished, well developed, in  no acute distress HEENT: normal Neck: no JVD, no masses. No carotid bruits Cardiac: RRR without murmur or gallop                Respiratory:  clear to auscultation bilaterally, normal work of breathing GI: soft, nontender, nondistended, + BS MS: no deformity or atrophy Ext: no pretibial edema, pedal pulses 2+= bilaterally Skin: warm and dry, no rash Neuro:  Strength and sensation are intact Psych: euthymic mood, full affect  EKG:  EKG is not ordered today. The ekg from 06/18/2015 is reviewed: Normal sinus rhythm with nonspecific T-wave abnormality, no change from previous tracing.  Recent Labs: 06/18/2015: BUN 18; Creatinine, Ser 0.71; Hemoglobin 13.5; Platelets 238; Potassium 3.3*; Sodium 140   Lipid Panel     Component Value Date/Time   CHOL  04/01/2010 0445    172        ATP III CLASSIFICATION:  <200     mg/dL   Desirable  200-239  mg/dL   Borderline High  >=240    mg/dL   High          TRIG 116 04/01/2010 0445   HDL 68 04/01/2010 0445   CHOLHDL 2.5 04/01/2010 0445   VLDL 23 04/01/2010 0445   LDLCALC  04/01/2010 0445    81        Total Cholesterol/HDL:CHD Risk Coronary Heart Disease Risk Table                     Men   Women  1/2 Average Risk   3.4   3.3  Average Risk       5.0   4.4  2 X Average Risk   9.6   7.1  3 X Average Risk  23.4   11.0        Use the calculated Patient Ratio above and the CHD Risk Table to determine the patient's CHD Risk.        ATP III CLASSIFICATION (LDL):  <100     mg/dL   Optimal  100-129  mg/dL   Near or Above                    Optimal  130-159  mg/dL   Borderline  160-189  mg/dL   High  >190     mg/dL   Very High      Wt Readings from Last 3 Encounters:  09/19/15 135 lb 12 oz (61.576 kg)  06/18/15 137 lb (62.143 kg)  08/05/14 129 lb 6.4 oz (58.695 kg)     Cardiac Studies Reviewed: 2D Echo 4.7.2014: Study Conclusions  - Left ventricle: The cavity size was normal. There was mild concentric hypertrophy. Systolic  function was normal. The estimated ejection fraction was in the range of 60% to 65%. Wall motion was  normal; there were no regional wall motion abnormalities. Doppler parameters are consistent with abnormal left ventricular relaxation (grade 1 diastolic dysfunction). - Aortic valve: There was no stenosis. - Mitral valve: Trivial regurgitation. - Right ventricle: The cavity size was normal. Systolic function was normal. - Pulmonary arteries: PA peak pressure: 76mm Hg (S). - Inferior vena cava: The vessel was normal in size; the respirophasic diameter changes were in the normal range (= 50%); findings are consistent with normal central venous pressure. Impressions:  - Normal LV size with mild LV hypertrophy.EF 60-65%. Normal RV size and systolic function. No significant valvular abnormalities.  ASSESSMENT AND PLAN: 1.  Paroxysmal atrial fibrillation. This patients CHA2DS2-VASc Score and unadjusted Ischemic Stroke Rate (% per year) is equal to 3.2 % stroke rate/year from a score of 3  Above score calculated as 1 point each if present [CHF, HTN, DM, Vascular=MI/PAD/Aortic Plaque, Age if 65-74, or Female] Above score calculated as 2 points each if present [Age > 75, or Stroke/TIA/TE]  Tolerating Eliquis without bleeding problems. Maintaining sinus rhythm. Discussed importance of alcohol and weight loss in reducing risk of recurrent afib.   2. Essential HTN: BP elevated today but good control at home. Follows low sodium diet.  Current medicines are reviewed with the patient today.  The patient does not have concerns regarding medicines.  Labs/ tests ordered today include:  No orders of the defined types were placed in this encounter.   Disposition:   FU one year  Signed, Sherren Mocha, MD  09/19/2015 2:14 PM    [facility] [place], [place], [place]  [address] Phone: [phone]; Fax: [phone]

## 2015-09-19 NOTE — Patient Instructions (Signed)

## 2015-10-20 ENCOUNTER — Other Ambulatory Visit: Payer: Self-pay | Admitting: Gastroenterology

## 2015-11-17 ENCOUNTER — Other Ambulatory Visit: Payer: Self-pay

## 2015-11-17 DIAGNOSIS — Z1231 Encounter for screening mammogram for malignant neoplasm of breast: Secondary | ICD-10-CM

## 2015-12-28 ENCOUNTER — Other Ambulatory Visit: Payer: Self-pay | Admitting: Acute Care

## 2015-12-28 DIAGNOSIS — Z87891 Personal history of nicotine dependence: Secondary | ICD-10-CM

## 2015-12-30 ENCOUNTER — Ambulatory Visit: Payer: Medicare Other

## 2016-01-27 ENCOUNTER — Ambulatory Visit
Admission: RE | Admit: 2016-01-27 | Discharge: 2016-01-27 | Disposition: A | Discharge status: Home / Self Care | Payer: Medicare Other | Source: Ambulatory Visit

## 2016-01-27 DIAGNOSIS — Z1231 Encounter for screening mammogram for malignant neoplasm of breast: Secondary | ICD-10-CM

## 2016-01-30 ENCOUNTER — Other Ambulatory Visit: Payer: Self-pay | Admitting: Family Medicine

## 2016-01-30 DIAGNOSIS — R928 Other abnormal and inconclusive findings on diagnostic imaging of breast: Secondary | ICD-10-CM

## 2016-02-03 ENCOUNTER — Ambulatory Visit
Admission: RE | Admit: 2016-02-03 | Discharge: 2016-02-03 | Disposition: A | Discharge status: Home / Self Care | Payer: Medicare Other | Source: Ambulatory Visit | Attending: Family Medicine | Admitting: Family Medicine

## 2016-02-03 DIAGNOSIS — R928 Other abnormal and inconclusive findings on diagnostic imaging of breast: Secondary | ICD-10-CM

## 2016-09-14 ENCOUNTER — Ambulatory Visit
Admission: RE | Admit: 2016-09-14 | Discharge: 2016-09-14 | Disposition: A | Discharge status: Home / Self Care | Payer: Medicare Other | Source: Ambulatory Visit | Attending: Acute Care | Admitting: Acute Care

## 2016-09-14 DIAGNOSIS — Z87891 Personal history of nicotine dependence: Secondary | ICD-10-CM

## 2016-09-18 ENCOUNTER — Encounter: Payer: Self-pay | Admitting: Acute Care

## 2016-09-18 ENCOUNTER — Telehealth: Payer: Self-pay | Admitting: Acute Care

## 2016-09-18 DIAGNOSIS — Z87891 Personal history of nicotine dependence: Secondary | ICD-10-CM

## 2016-09-18 NOTE — Telephone Encounter (Signed)
I have called Sheila Bruce with the results of her low-dose screening CT. I explained that her scan was read as a lung RADS category 2, indicating nodules that are benign in appearance or behavior. Recommendation is for her annual screening with low-dose chest CT in 12 months. We will order and schedule the exam for December 2018. Additionally I explained to the patient that there was notation of emphysema, and coronary artery atherosclerosis. She indicated to me that she is on a cholesterol medication. She also indicated that this was a family problem, as it has affected many of her family members. We did discuss the need to possibly attempt lifestyle modifications and diet in addition to taking cholesterol medication. She verbalized understanding of the above. I explained that I will send a copy of these results to her primary care physician Dr. Kathyrn Lass for completeness of her medical record. She verbalized understanding

## 2016-10-22 NOTE — Progress Notes (Signed)
Cardiology Office Note    Date:  10/23/2016   ID:  Sheila Bruce, DOB [birth year], MRN [id number]  PCP:  Tawanna Solo, MD  Cardiologist:  Dr. Burt Knack  Chief Complaint: Yearly follow up  History of Present Illness:   Sheila Bruce is a 71 y.o. female with hx of HTN, HLD, former tobacco smoking and PAF who presents for follow up.  The patient was initially diagnosed with atrial fibrillation in 2014 when she was hospitalized with atrial fibrillation RVR. This occurred in the setting of heavy alcohol use. Normal Echo and stress test. She has subsequently done well in follow-up. CHADS-Vasc = 3. Has been maintained on Eliquis without bleeding problems.  She was doing well on cardiac stand point when last seen by Dr. Burt Knack 09/19/15.   Recent CT of lung for screening showed Lung-RADS, emphysema and coronary artery atherosclerosis.   Today here for follow-up. Recently started on fish oil due to elevated triglyceride. She exercises 3-4 times in a week without any chest tightness or shortness of breath. However, she complains of intermittent upper chest tightness lasting for one to 2 minutes and resolved by itself. No other associated radiation, nausea, vomiting or diaphoresis. Recently complains of fatigue and tiredness however she attributes that to chronic tobacco smoking. She quit in 2007. She denies any orthopnea, PND, syncope, lower extremity edema, abdominal pain, melena or blood in her stool or urine.  Past Medical History:  Diagnosis Date  . Anxiety   . Habitual alcohol use   . Heart murmur   . Hx of cardiovascular stress test    a. ETT-Myoview 5/14:  Normal; EF 79%, no ischemia, exercised 10:00.  . Hyperlipidemia   . Hypertension   . PAF (paroxysmal atrial fibrillation) ([place])    a. Echo 4/14: Mild concentric LVH, EF 123456, grade 1 diastolic dysfunction, trivial MR, PASP 29;  b. Eliquis 5 bid  . Vertigo     Past Surgical History:  Procedure Laterality Date  . ABDOMINAL  HYSTERECTOMY    . CYSTOSCOPY    . Laproscopy-Abdominal    . TENDON REPAIR     Left hand    Current Medications: Prior to Admission medications   Medication Sig Start Date End Date Taking? Authorizing Provider  apixaban (ELIQUIS) 5 MG TABS tablet Take 1 tablet (5 mg total) by mouth 2 (two) times daily. 01/13/13   Hosie Poisson, MD  atorvastatin (LIPITOR) 20 MG tablet Take 1 tablet (20 mg total) by mouth daily at 6 PM. 01/13/13   Hosie Poisson, MD  cephALEXin (KEFLEX) 500 MG capsule Take 1 capsule (500 mg total) by mouth 4 (four) times daily. 08/05/15   Janne Napoleon, NP  conjugated estrogens (PREMARIN) vaginal cream Place 1 Applicatorful vaginally as needed.    Historical Provider, MD  diltiazem (CARDIZEM CD) 120 MG 24 hr capsule Take 1 capsule (120 mg total) by mouth at bedtime. 01/22/13   Liliane Shi, PA-C  fluticasone (FLONASE) 50 MCG/ACT nasal spray Place 2 sprays into the nose daily.    Historical Provider, MD  ibuprofen (ADVIL) 200 MG tablet Take 200 mg by mouth as needed for pain.    Historical Provider, MD  loratadine (CLARITIN) 10 MG tablet Take 10 mg by mouth daily.    Historical Provider, MD  losartan (COZAAR) 100 MG tablet Take 100 mg by mouth daily.    Historical Provider, MD  Multiple Vitamin (MULTIVITAMIN) tablet Take 1 tablet by mouth daily.    Historical Provider, MD  Allergies:   Ciprocinonide [fluocinolone]; Codeine; Ciprofloxacin; and Doxycycline   Social History   Social History  . Marital status: Married    Spouse name: N/A  . Number of children: N/A  . Years of education: N/A   Social History Main Topics  . Smoking status: Former Smoker    Types: Cigarettes    Quit date: 01/12/2005  . Smokeless tobacco: Never Used     Comment: Smoked for 35 yrs - As of 08/2015  chews nicotine gum  . Alcohol use 0.6 oz/week    1 Glasses of wine per week     Comment: Occasional use now - less than one per day during week, Occasional 2-3 on the weekend.  . Drug use: No  . Sexual  activity: No   Other Topics Concern  . None   Social History Narrative  . None     Family History:  The patient's family history includes High Cholesterol in her father; Peripheral vascular disease in her mother.   ROS:   Please see the history of present illness.    ROS All other systems reviewed and are negative.   PHYSICAL EXAM:   VS:  BP (!) 160/80 (BP Location: Right Arm, Patient Position: Sitting, Cuff Size: Normal)   Pulse 72   Ht 5' (1.524 m)   Wt 141 lb (64 kg)   BMI 27.54 kg/m    GEN: Well nourished, well developed, in no acute distress  HEENT: normal  Neck: no JVD, carotid bruits, or masses Cardiac: RRR; Systolic murmurs, rubs, or gallops,no edema  Respiratory:  clear to auscultation bilaterally, normal work of breathing GI: soft, nontender, nondistended, + BS MS: no deformity or atrophy  Skin: warm and dry, no rash Neuro:  Alert and Oriented x 3, Strength and sensation are intact Psych: euthymic mood, full affect  Wt Readings from Last 3 Encounters:  10/23/16 141 lb (64 kg)  06/18/15 137 lb (62.1 kg)  08/05/14 129 lb 6.4 oz (58.7 kg)      Studies/Labs Reviewed:   EKG:  EKG is ordered today.  The ekg ordered today demonstrates NSR  Recent Labs: No results found for requested labs within last 8760 hours.   Lipid Panel    Component Value Date/Time   CHOL  04/01/2010 0445    172        ATP III CLASSIFICATION:  <200     mg/dL   Desirable  200-239  mg/dL   Borderline High  >=240    mg/dL   High          TRIG 116 04/01/2010 0445   HDL 68 04/01/2010 0445   CHOLHDL 2.5 04/01/2010 0445   VLDL 23 04/01/2010 0445   LDLCALC  04/01/2010 0445    81        Total Cholesterol/HDL:CHD Risk Coronary Heart Disease Risk Table                     Men   Women  1/2 Average Risk   3.4   3.3  Average Risk       5.0   4.4  2 X Average Risk   9.6   7.1  3 X Average Risk  23.4   11.0        Use the calculated Patient Ratio above and the CHD Risk Table to  determine the patient's CHD Risk.        ATP III CLASSIFICATION (LDL):  <100  mg/dL   Optimal  100-129  mg/dL   Near or Above                    Optimal  130-159  mg/dL   Borderline  160-189  mg/dL   High  >190     mg/dL   Very High    Additional studies/ records that were reviewed today include:   2D Echo 4.7.2014: Study Conclusions  - Left ventricle: The cavity size was normal. There was mild concentric hypertrophy. Systolic function was normal. The estimated ejection fraction was in the range of 60% to 65%. Wall motion was normal; there were no regional wall motion abnormalities. Doppler parameters are consistent with abnormal left ventricular relaxation (grade 1 diastolic dysfunction). - Aortic valve: There was no stenosis. - Mitral valve: Trivial regurgitation. - Right ventricle: The cavity size was normal. Systolic function was normal. - Pulmonary arteries: PA peak pressure: 28mm Hg (S). - Inferior vena cava: The vessel was normal in size; the respirophasic diameter changes were in the normal range (= 50%); findings are consistent with normal central venous pressure. Impressions:  - Normal LV size with mild LV hypertrophy.EF 60-65%. Normal RV size and systolic function. No significant valvular abnormalities.    ASSESSMENT & PLAN:    1. PAF - Containing sinus rhythm. No bleeding complications. Continue Eliquis and Cardizem.  2. HTN - Initial BP of 160/80. Repeat 142/84. Process has been running in 130-150/80-90 at home. She has adopted DASH diet. Advised to keep log. She will let us know if blood presser constantly about 140/90.  3. HLD - Followed by primary care provider. Continue Lipitor and fish oil.  4. Coronary artery atherosclerosis - Recently seen on CT of chest. She has a vague symptoms of chest tightness at rest. However, she is asymptomatic during exercise. She wants to follow heart healthy diet and increase her exercise  regimen prior to any further testing. Spent greater than 25 minutes reviewing CT scan result, cardiac complications and life style changes. She will let us know if worsening symptoms. Normal stress test in 123456.  5. Systolic Murmur - She states that she has murmur and mild mitral valve prolapse since her childhood. She did not require any prophylactics abx prior to dental work up.  Last echocardiogram in 2014 did not show any valvular abnormality. Will continue to monitor.    Medication Adjustments/Labs and Tests Ordered: Current medicines are reviewed at length with the patient today.  Concerns regarding medicines are outlined above.  Medication changes, Labs and Tests ordered today are listed in the Patient Instructions below. Patient Instructions  Medication Instructions:   Your physician recommends that you continue on your current medications as directed. Please refer to the Current Medication list given to you today.    Follow-Up:  Your physician wants you to follow-up in: [place] will receive a reminder letter in the mail two months in advance. If you don't receive a letter, please call our office to schedule the follow-up appointment.        If you need a refill on your cardiac medications before your next appointment, please call your pharmacy.      Jarrett Soho, [place]  10/23/2016 9:35 AM    [address], [place], [place]  [address] Phone: [phone]; Fax: [phone]

## 2016-10-23 ENCOUNTER — Encounter: Payer: Self-pay | Admitting: Physician Assistant

## 2016-10-23 ENCOUNTER — Ambulatory Visit (INDEPENDENT_AMBULATORY_CARE_PROVIDER_SITE_OTHER): Payer: Medicare Other | Admitting: Physician Assistant

## 2016-10-23 ENCOUNTER — Encounter (INDEPENDENT_AMBULATORY_CARE_PROVIDER_SITE_OTHER): Payer: Self-pay

## 2016-10-23 VITALS — BP 160/80 | HR 72 | Ht 60.0 in | Wt 141.0 lb

## 2016-10-23 DIAGNOSIS — I480 Paroxysmal atrial fibrillation: Secondary | ICD-10-CM | POA: Diagnosis not present

## 2016-10-23 DIAGNOSIS — I10 Essential (primary) hypertension: Secondary | ICD-10-CM | POA: Diagnosis not present

## 2016-10-23 DIAGNOSIS — R011 Cardiac murmur, unspecified: Secondary | ICD-10-CM | POA: Diagnosis not present

## 2016-10-23 DIAGNOSIS — E782 Mixed hyperlipidemia: Secondary | ICD-10-CM

## 2016-10-23 DIAGNOSIS — R938 Abnormal findings on diagnostic imaging of other specified body structures: Secondary | ICD-10-CM

## 2016-10-23 DIAGNOSIS — R9389 Abnormal findings on diagnostic imaging of other specified body structures: Secondary | ICD-10-CM

## 2016-10-23 NOTE — Patient Instructions (Signed)
Medication Instructions:   Your physician recommends that you continue on your current medications as directed. Please refer to the Current Medication list given to you today.    Follow-Up:  Your physician wants you to follow-up in: [place] will receive a reminder letter in the mail two months in advance. If you don't receive a letter, please call our office to schedule the follow-up appointment.        If you need a refill on your cardiac medications before your next appointment, please call your pharmacy.

## 2016-10-29 ENCOUNTER — Ambulatory Visit: Payer: Medicare Other | Admitting: Cardiovascular Disease

## 2016-12-28 ENCOUNTER — Other Ambulatory Visit: Payer: Self-pay | Admitting: Family Medicine

## 2016-12-28 DIAGNOSIS — Z1231 Encounter for screening mammogram for malignant neoplasm of breast: Secondary | ICD-10-CM

## 2017-01-29 ENCOUNTER — Ambulatory Visit
Admission: RE | Admit: 2017-01-29 | Discharge: 2017-01-29 | Disposition: A | Discharge status: Home / Self Care | Payer: Medicare Other | Source: Ambulatory Visit | Attending: Family Medicine | Admitting: Family Medicine

## 2017-01-29 DIAGNOSIS — Z1231 Encounter for screening mammogram for malignant neoplasm of breast: Secondary | ICD-10-CM

## 2017-05-15 ENCOUNTER — Encounter: Payer: Self-pay | Admitting: Cardiovascular Disease

## 2017-05-15 ENCOUNTER — Encounter (INDEPENDENT_AMBULATORY_CARE_PROVIDER_SITE_OTHER): Payer: Self-pay

## 2017-05-15 ENCOUNTER — Ambulatory Visit (INDEPENDENT_AMBULATORY_CARE_PROVIDER_SITE_OTHER): Payer: Medicare Other | Admitting: Cardiovascular Disease

## 2017-05-15 VITALS — BP 150/60 | HR 76 | Ht 60.0 in | Wt 143.4 lb

## 2017-05-15 DIAGNOSIS — R0989 Other specified symptoms and signs involving the circulatory and respiratory systems: Secondary | ICD-10-CM | POA: Diagnosis not present

## 2017-05-15 DIAGNOSIS — I480 Paroxysmal atrial fibrillation: Secondary | ICD-10-CM

## 2017-05-15 DIAGNOSIS — R011 Cardiac murmur, unspecified: Secondary | ICD-10-CM

## 2017-05-15 NOTE — Progress Notes (Signed)
Cardiology Office Note Date:  05/15/2017   ID:  Sheila Bruce, DOB Feb 16, 1946, MRN [id number]  PCP:  Kathyrn Lass, MD  Cardiologist:  Sherren Mocha, MD    Chief Complaint  Patient presents with  . Follow-up     History of Present Illness: Sheila Bruce is a 71 y.o. female who presents for follow-up of atrial fibrillation. The patient was initially diagnosed with atrial fibrillation in 2014 when she was hospitalized with atrial fibrillation RVR. This occurred in the setting of heavy alcohol use. She has subsequently done well in follow-up.  CHADS-Vasc = 3. Has been maintained on Eliquis without bleeding problems.  The patient is here alone today. She's been doing well from a cardiac perspective. States that her home blood pressures have been running in the 130s over 70s on average. She's tolerating anticoagulation without significant bleeding problems. Her Cardizem dose was increased in blood pressure has been better controlled since then. She denies heart palpitations, chest pain, or shortness of breath.   Past Medical History:  Diagnosis Date  . Anxiety   . Habitual alcohol use   . Heart murmur   . Hx of cardiovascular stress test    a. ETT-Myoview 5/14:  Normal; EF 79%, no ischemia, exercised 10:00.  . Hyperlipidemia   . Hypertension   . PAF (paroxysmal atrial fibrillation) ([place])    a. Echo 4/14: Mild concentric LVH, EF 53-66%, grade 1 diastolic dysfunction, trivial MR, PASP 29;  b. Eliquis 5 bid  . Vertigo     Past Surgical History:  Procedure Laterality Date  . ABDOMINAL HYSTERECTOMY    . BREAST EXCISIONAL BIOPSY    . CYSTOSCOPY    . Laproscopy-Abdominal    . TENDON REPAIR     Left hand    Current Outpatient Prescriptions  Medication Sig Dispense Refill  . apixaban (ELIQUIS) 5 MG TABS tablet Take 1 tablet (5 mg total) by mouth 2 (two) times daily. 60 tablet 0  . atorvastatin (LIPITOR) 20 MG tablet Take 1 tablet (20 mg total) by mouth daily at 6 PM. 30 tablet 0   . conjugated estrogens (PREMARIN) vaginal cream Place 1 Applicatorful vaginally as directed.     . diltiazem (CARDIZEM CD) 240 MG 24 hr capsule Take 240 mg by mouth daily.    . fluticasone (FLONASE) 50 MCG/ACT nasal spray Place 2 sprays into the nose daily.    Marland Kitchen ibuprofen (ADVIL) 200 MG tablet Take 200 mg by mouth as needed for pain.    Marland Kitchen loratadine (CLARITIN) 10 MG tablet Take 10 mg by mouth daily.    Marland Kitchen losartan (COZAAR) 100 MG tablet Take 100 mg by mouth daily.    . Multiple Vitamin (MULTIVITAMIN) tablet Take 1 tablet by mouth daily.    . Omega-3 Fatty Acids (FISH OIL BURP-LESS) 500 MG CAPS Take 1 capsule by mouth daily.      No current facility-administered medications for this visit.     Allergies:   Ciprocinonide [fluocinolone]; Codeine; Ciprofloxacin; and Doxycycline   Social History:  The patient  reports that she quit smoking about 12 years ago. Her smoking use included Cigarettes. She has never used smokeless tobacco. She reports that she drinks about 0.6 oz of alcohol per week . She reports that she does not use drugs.   Family History:  The patient's family history includes Autoimmune disease in her unknown relative; Breast cancer in her maternal grandmother; CAD in her unknown relative and unknown relative; High Cholesterol in her father; Hyperlipidemia  in her unknown relative; Hypertension in her unknown relative; Peripheral vascular disease in her mother.    ROS:  Please see the history of present illness.  Otherwise, review of systems is positive for Hearing loss, snoring, balance problems.  All other systems are reviewed and negative.    PHYSICAL EXAM: VS:  BP (!) 150/60   Pulse 76   Ht 5' (1.524 m)   Wt 143 lb 6.4 oz (65 kg)   BMI 28.01 kg/m  , BMI Body mass index is 28.01 kg/m. GEN: Well nourished, well developed, in no acute distress  HEENT: normal  Neck: no JVD, no masses. Bilateral carotid bruits Cardiac: RRR with 2/6 harsh early peaking systolic murmur at the  RUSB                Respiratory:  clear to auscultation bilaterally, normal work of breathing GI: soft, nontender, nondistended, + BS MS: no deformity or atrophy  Ext: no pretibial edema, pedal pulses 2+= bilaterally Skin: warm and dry, no rash Neuro:  Strength and sensation are intact Psych: euthymic mood, full affect  EKG:  EKG is not ordered today. The ekg ordered From 10/23/2016 demonstrates normal sinus rhythm and is within normal limits  Recent Labs: No results found for requested labs within last 8760 hours.   Lipid Panel     Component Value Date/Time   CHOL  04/01/2010 0445    172        ATP III CLASSIFICATION:  <200     mg/dL   Desirable  200-239  mg/dL   Borderline High  >=240    mg/dL   High          TRIG 116 04/01/2010 0445   HDL 68 04/01/2010 0445   CHOLHDL 2.5 04/01/2010 0445   VLDL 23 04/01/2010 0445   LDLCALC  04/01/2010 0445    81        Total Cholesterol/HDL:CHD Risk Coronary Heart Disease Risk Table                     Men   Women  1/2 Average Risk   3.4   3.3  Average Risk       5.0   4.4  2 X Average Risk   9.6   7.1  3 X Average Risk  23.4   11.0        Use the calculated Patient Ratio above and the CHD Risk Table to determine the patient's CHD Risk.        ATP III CLASSIFICATION (LDL):  <100     mg/dL   Optimal  100-129  mg/dL   Near or Above                    Optimal  130-159  mg/dL   Borderline  160-189  mg/dL   High  >190     mg/dL   Very High      Wt Readings from Last 3 Encounters:  05/15/17 143 lb 6.4 oz (65 kg)  10/23/16 141 lb (64 kg)  06/18/15 137 lb (62.1 kg)     ASSESSMENT AND PLAN: 1.  Paroxysmal atrial fibrillation: Tolerating Eliquis for anticoagulation and maintaining sinus rhythm.  2. Heart murmur: Suspect aortic stenosis based on the characteristics. Recommend an echocardiogram to further evaluate. Last echocardiogram from 2014 is reviewed and showed no significant valvular dysfunction.  3. Bilateral carotid  bruits: Check carotid ultrasound. Medications are reviewed and the patient is  on a statin drug and an anticoagulant drugs. She has no stroke or TIA symptoms.  4. Hypertension: Blood pressure elevated in the office today, but home readings are in range on a combination of diltiazem and losartan.  5. Hyperlipidemia: Treated with atorvastatin 20 mg.  Current medicines are reviewed with the patient today.  The patient does not have concerns regarding medicines.  Labs/ tests ordered today include:  No orders of the defined types were placed in this encounter.   Disposition:   FU one year  Signed, Sherren Mocha, MD  05/15/2017 10:30 AM    [facility] [address], [place], [place]  [address] Phone: [phone]; Fax: [phone]

## 2017-05-15 NOTE — Patient Instructions (Signed)
Medication Instructions:  Your physician recommends that you continue on your current medications as directed. Please refer to the Current Medication list given to you today.   Labwork: None Ordered   Testing/Procedures: Your physician has requested that you have a carotid duplex. This test is an ultrasound of the carotid arteries in your neck. It looks at blood flow through these arteries that supply the brain with blood. Allow one hour for this exam. There are no restrictions or special instructions.  Your physician has requested that you have an echocardiogram. Echocardiography is a painless test that uses sound waves to create images of your heart. It provides your doctor with information about the size and shape of your heart and how well your heart's chambers and valves are working. This procedure takes approximately one hour. There are no restrictions for this procedure.    Follow-Up: Your physician wants you to follow-up in: 1 year with Dr. Burt Knack.  You will receive a reminder letter in the mail two months in advance. If you don't receive a letter, please call our office to schedule the follow-up appointment.   If you need a refill on your cardiac medications before your next appointment, please call your pharmacy.   Thank you for choosing [facility]! Christen Bame, RN [phone]

## 2017-05-31 ENCOUNTER — Ambulatory Visit (HOSPITAL_BASED_OUTPATIENT_CLINIC_OR_DEPARTMENT_OTHER): Payer: Medicare Other

## 2017-05-31 ENCOUNTER — Other Ambulatory Visit: Payer: Self-pay

## 2017-05-31 ENCOUNTER — Ambulatory Visit (HOSPITAL_COMMUNITY)
Admission: RE | Admit: 2017-05-31 | Discharge: 2017-05-31 | Disposition: A | Discharge status: Home / Self Care | Payer: Medicare Other | Source: Ambulatory Visit | Attending: Internal Medicine | Admitting: Internal Medicine

## 2017-05-31 DIAGNOSIS — R011 Cardiac murmur, unspecified: Secondary | ICD-10-CM | POA: Diagnosis not present

## 2017-05-31 DIAGNOSIS — I6523 Occlusion and stenosis of bilateral carotid arteries: Secondary | ICD-10-CM | POA: Insufficient documentation

## 2017-05-31 DIAGNOSIS — I480 Paroxysmal atrial fibrillation: Secondary | ICD-10-CM

## 2017-05-31 DIAGNOSIS — R0989 Other specified symptoms and signs involving the circulatory and respiratory systems: Secondary | ICD-10-CM

## 2017-08-15 ENCOUNTER — Other Ambulatory Visit: Payer: Self-pay | Admitting: Acute Care

## 2017-08-15 DIAGNOSIS — Z87891 Personal history of nicotine dependence: Secondary | ICD-10-CM

## 2017-08-15 DIAGNOSIS — Z122 Encounter for screening for malignant neoplasm of respiratory organs: Secondary | ICD-10-CM

## 2017-09-16 ENCOUNTER — Ambulatory Visit: Payer: Medicare Other

## 2017-09-20 ENCOUNTER — Ambulatory Visit
Admission: RE | Admit: 2017-09-20 | Discharge: 2017-09-20 | Disposition: A | Discharge status: Home / Self Care | Payer: Medicare Other | Source: Ambulatory Visit | Attending: Acute Care | Admitting: Acute Care

## 2017-09-20 DIAGNOSIS — Z87891 Personal history of nicotine dependence: Secondary | ICD-10-CM

## 2017-09-20 DIAGNOSIS — Z122 Encounter for screening for malignant neoplasm of respiratory organs: Secondary | ICD-10-CM

## 2017-10-17 ENCOUNTER — Other Ambulatory Visit: Payer: Self-pay | Admitting: Acute Care

## 2017-10-17 DIAGNOSIS — Z122 Encounter for screening for malignant neoplasm of respiratory organs: Secondary | ICD-10-CM

## 2017-10-17 DIAGNOSIS — F17210 Nicotine dependence, cigarettes, uncomplicated: Secondary | ICD-10-CM

## 2017-10-29 ENCOUNTER — Other Ambulatory Visit: Payer: Self-pay | Admitting: Family Medicine

## 2017-10-29 DIAGNOSIS — R9389 Abnormal findings on diagnostic imaging of other specified body structures: Secondary | ICD-10-CM

## 2017-10-29 DIAGNOSIS — N63 Unspecified lump in unspecified breast: Secondary | ICD-10-CM

## 2017-11-04 ENCOUNTER — Ambulatory Visit
Admission: RE | Admit: 2017-11-04 | Discharge: 2017-11-04 | Disposition: A | Discharge status: Home / Self Care | Payer: Medicare Other | Source: Ambulatory Visit | Attending: Family Medicine | Admitting: Family Medicine

## 2017-11-04 ENCOUNTER — Ambulatory Visit: Payer: Medicare Other

## 2017-11-04 DIAGNOSIS — N63 Unspecified lump in unspecified breast: Secondary | ICD-10-CM

## 2017-12-05 ENCOUNTER — Telehealth: Payer: Self-pay | Admitting: Acute Care

## 2017-12-06 NOTE — Telephone Encounter (Signed)
Pt is calling back. Cb is [phone].

## 2017-12-09 NOTE — Telephone Encounter (Signed)
Spoke with pt and advised that per results of LDCT 09/20/2017 she will not be due for another LDCT until 09/2018.  Pt verbalized understanding.  Nothing further needed.

## 2018-01-15 ENCOUNTER — Other Ambulatory Visit: Payer: Self-pay | Admitting: Family Medicine

## 2018-01-15 DIAGNOSIS — Z1231 Encounter for screening mammogram for malignant neoplasm of breast: Secondary | ICD-10-CM

## 2018-02-06 ENCOUNTER — Ambulatory Visit
Admission: RE | Admit: 2018-02-06 | Discharge: 2018-02-06 | Disposition: A | Discharge status: Home / Self Care | Payer: Medicare Other | Source: Ambulatory Visit | Attending: Family Medicine | Admitting: Family Medicine

## 2018-02-06 DIAGNOSIS — Z1231 Encounter for screening mammogram for malignant neoplasm of breast: Secondary | ICD-10-CM

## 2018-07-16 ENCOUNTER — Encounter: Payer: Self-pay | Admitting: Cardiovascular Disease

## 2018-07-16 ENCOUNTER — Ambulatory Visit: Payer: Medicare Other | Admitting: Cardiovascular Disease

## 2018-07-16 ENCOUNTER — Encounter (INDEPENDENT_AMBULATORY_CARE_PROVIDER_SITE_OTHER): Payer: Self-pay

## 2018-07-16 VITALS — BP 152/80 | HR 65 | Ht 60.0 in | Wt 148.0 lb

## 2018-07-16 DIAGNOSIS — E782 Mixed hyperlipidemia: Secondary | ICD-10-CM

## 2018-07-16 DIAGNOSIS — I10 Essential (primary) hypertension: Secondary | ICD-10-CM

## 2018-07-16 DIAGNOSIS — I480 Paroxysmal atrial fibrillation: Secondary | ICD-10-CM | POA: Diagnosis not present

## 2018-07-16 DIAGNOSIS — I700 Atherosclerosis of aorta: Secondary | ICD-10-CM | POA: Diagnosis not present

## 2018-07-16 DIAGNOSIS — R011 Cardiac murmur, unspecified: Secondary | ICD-10-CM

## 2018-07-16 MED ORDER — CHLORTHALIDONE 25 MG PO TABS: 25.0000 mg | ORAL_TABLET | Freq: Every day | ORAL | 11 refills | Status: DC

## 2018-07-16 NOTE — Patient Instructions (Signed)
Medication Instructions:  1) START CHLORTHALIDONE 25 mg daily  Labwork: Your provider recommends that you return for lab work in 2 weeks. You do not need to be fasting. You may come any time between 7:30AM and 5:00PM. Please check in first.  Testing/Procedures: Dr. Burt Knack recommends you have an [place].  Follow-Up: Your provider wants you to follow-up in: 1 year with Dr. Burt Knack.  You will receive a reminder letter in the mail two months in advance. If you don't receive a letter, please call our office to schedule the follow-up appointment.    Any Other Special Instructions Will Be Listed Below (If Applicable).     If you need a refill on your cardiac medications before your next appointment, please call your pharmacy.

## 2018-07-16 NOTE — Progress Notes (Signed)
Cardiology Office Note:    Date:  07/16/2018   ID:  AMANAT HACKEL, DOB 1946-05-30, MRN [id number]  PCP:  Kathyrn Lass, MD  Cardiologist:  Sherren Mocha, MD  Electrophysiologist:  None   Referring MD: Kathyrn Lass, MD   Chief Complaint  Patient presents with  . Atrial Fibrillation   History of Present Illness:    Sheila Bruce is a 72 y.o. female with a hx of atrial fibrillation.  She is been anticoagulated with apixaban without bleeding problems.  She is been maintaining sinus rhythm over recent visits.  The patient is here alone today.  She is been doing fairly well.  She has noted some problems with heat intolerance when she is out playing golf on a very hot day she has felt weak with this.  She denies chest pain, shortness of breath, or leg swelling.  She is concerned that she may have an abdominal aortic aneurysm as she was reviewing an old CT scan that commented on an aortic aneurysm.  I cannot find any records of this but she has not had a focused scan on the abdominal aorta in our system.  Past Medical History:  Diagnosis Date  . Anxiety   . Habitual alcohol use   . Heart murmur   . Hx of cardiovascular stress test    a. ETT-Myoview 5/14:  Normal; EF 79%, no ischemia, exercised 10:00.  . Hyperlipidemia   . Hypertension   . PAF (paroxysmal atrial fibrillation) ([place])    a. Echo 4/14: Mild concentric LVH, EF 93-71%, grade 1 diastolic dysfunction, trivial MR, PASP 29;  b. Eliquis 5 bid  . Vertigo     Past Surgical History:  Procedure Laterality Date  . ABDOMINAL HYSTERECTOMY    . BREAST EXCISIONAL BIOPSY    . CYSTOSCOPY    . Laproscopy-Abdominal    . TENDON REPAIR     Left hand    Current Medications: Current Meds  Medication Sig  . apixaban (ELIQUIS) 5 MG TABS tablet Take 1 tablet (5 mg total) by mouth 2 (two) times daily.  Marland Kitchen atorvastatin (LIPITOR) 20 MG tablet Take 1 tablet (20 mg total) by mouth daily at 6 PM.  . conjugated estrogens (PREMARIN) vaginal cream  Place 1 Applicatorful vaginally as directed.   . diltiazem (CARDIZEM CD) 240 MG 24 hr capsule Take 240 mg by mouth daily.  . fluticasone (FLONASE) 50 MCG/ACT nasal spray Place 2 sprays into the nose daily.  Marland Kitchen ibuprofen (ADVIL) 200 MG tablet Take 200 mg by mouth as needed for pain.  Marland Kitchen loratadine (CLARITIN) 10 MG tablet Take 10 mg by mouth daily.  Marland Kitchen losartan (COZAAR) 100 MG tablet Take 100 mg by mouth daily.  . Multiple Vitamin (MULTIVITAMIN) tablet Take 1 tablet by mouth daily.  . Omega-3 Fatty Acids (FISH OIL BURP-LESS) 500 MG CAPS Take 1 capsule by mouth daily.      Allergies:   Ciprocinonide [fluocinolone]; Codeine; Ciprofloxacin; and Doxycycline   Social History   Socioeconomic History  . Marital status: Married    Spouse name: Not on file  . Number of children: Not on file  . Years of education: Not on file  . Highest education level: Not on file  Occupational History  . Not on file  Social Needs  . Financial resource strain: Not on file  . Food insecurity:    Worry: Not on file    Inability: Not on file  . Transportation needs:    Medical: Not on file  Non-medical: Not on file  Tobacco Use  . Smoking status: Former Smoker    Types: Cigarettes    Last attempt to quit: 01/12/2005    Years since quitting: 13.5  . Smokeless tobacco: Never Used  . Tobacco comment: Smoked for 35 yrs - As of 08/2015  chews nicotine gum  Substance and Sexual Activity  . Alcohol use: Yes    Alcohol/week: 1.0 standard drinks    Types: 1 Glasses of wine per week    Comment: Occasional use now - less than one per day during week, Occasional 2-3 on the weekend.  . Drug use: No  . Sexual activity: Never  Lifestyle  . Physical activity:    Days per week: Not on file    Minutes per session: Not on file  . Stress: Not on file  Relationships  . Social connections:    Talks on phone: Not on file    Gets together: Not on file    Attends religious service: Not on file    Active member of club or  organization: Not on file    Attends meetings of clubs or organizations: Not on file    Relationship status: Not on file  Other Topics Concern  . Not on file  Social History Narrative  . Not on file     Family History: The patient's family history includes Autoimmune disease in her unknown relative; Breast cancer in her maternal grandmother and paternal grandmother; CAD in her unknown relative and unknown relative; High Cholesterol in her father; Hyperlipidemia in her unknown relative; Hypertension in her unknown relative; Peripheral vascular disease in her mother.  ROS:   Please see the history of present illness.    Positive for back pain, palpitations. All other systems reviewed and are negative.  EKGs/Labs/Other Studies Reviewed:    The following studies were reviewed today: Echocardiogram May 31, 2017: Left ventricle:  The cavity size was normal. Systolic function was normal. The estimated ejection fraction was in the range of 60% to 65%. Wall motion was normal; there were no regional wall motion abnormalities. The transmitral flow pattern was normal. The deceleration time of the early transmitral flow velocity was normal. The pulmonary vein flow pattern was normal. The tissue Doppler parameters were normal. Left ventricular diastolic function parameters were normal.  ------------------------------------------------------------------- Aortic valve:   Trileaflet; normal thickness leaflets. Mobility was not restricted.  Doppler:  Transvalvular velocity was within the normal range. There was no stenosis. There was no regurgitation.   ------------------------------------------------------------------- Aorta:  Aortic root: The aortic root was normal in size.  ------------------------------------------------------------------- Mitral valve:   Structurally normal valve.   Mobility was not restricted.  Doppler:  Transvalvular velocity was within the normal range. There was  no evidence for stenosis. There was trivial regurgitation.    Peak gradient (D): 3 mm Hg.  ------------------------------------------------------------------- Left atrium:  The atrium was normal in size.  ------------------------------------------------------------------- Atrial septum:  No defect or patent foramen ovale was identified.   ------------------------------------------------------------------- Right ventricle:  The cavity size was normal. Wall thickness was normal. Systolic function was normal.  ------------------------------------------------------------------- Pulmonic valve:    Doppler:  Transvalvular velocity was within the normal range. There was no evidence for stenosis. There was trivial regurgitation.  ------------------------------------------------------------------- Tricuspid valve:   Structurally normal valve.    Doppler: Transvalvular velocity was within the normal range. There was mild regurgitation.  ------------------------------------------------------------------- Pulmonary artery:   The main pulmonary artery was normal-sized. Systolic pressure was within the normal range.  -------------------------------------------------------------------  Right atrium:  The atrium was normal in size.  ------------------------------------------------------------------- Pericardium:  The pericardium was normal in appearance. There was no pericardial effusion.  ------------------------------------------------------------------- Systemic veins: Inferior vena cava: The vessel was normal in size. The respirophasic diameter changes were in the normal range (>= 50%), consistent with normal central venous pressure.   EKG:  EKG is ordered today.  The ekg ordered today demonstrates NSR 65 bpm, within normal limits  Recent Labs: No results found for requested labs within last 8760 hours.  Recent Lipid Panel    Component Value Date/Time   CHOL  04/01/2010  0445    172        ATP III CLASSIFICATION:  <200     mg/dL   Desirable  200-239  mg/dL   Borderline High  >=240    mg/dL   High          TRIG 116 04/01/2010 0445   HDL 68 04/01/2010 0445   CHOLHDL 2.5 04/01/2010 0445   VLDL 23 04/01/2010 0445   LDLCALC  04/01/2010 0445    81        Total Cholesterol/HDL:CHD Risk Coronary Heart Disease Risk Table                     Men   Women  1/2 Average Risk   3.4   3.3  Average Risk       5.0   4.4  2 X Average Risk   9.6   7.1  3 X Average Risk  23.4   11.0        Use the calculated Patient Ratio above and the CHD Risk Table to determine the patient's CHD Risk.        ATP III CLASSIFICATION (LDL):  <100     mg/dL   Optimal  100-129  mg/dL   Near or Above                    Optimal  130-159  mg/dL   Borderline  160-189  mg/dL   High  >190     mg/dL   Very High    Physical Exam:    VS:  BP (!) 152/80   Pulse 65   Ht 5' (1.524 m)   Wt 148 lb (67.1 kg)   BMI 28.90 kg/m     Wt Readings from Last 3 Encounters:  07/16/18 148 lb (67.1 kg)  05/15/17 143 lb 6.4 oz (65 kg)  10/23/16 141 lb (64 kg)     GEN:  Well nourished, well developed in no acute distress HEENT: Normal NECK: No JVD; No carotid bruits LYMPHATICS: No lymphadenopathy CARDIAC: RRR, 2/6 systolic murmur at the RUSB RESPIRATORY:  Clear to auscultation without rales, wheezing or rhonchi  ABDOMEN: Soft, non-tender, non-distended MUSCULOSKELETAL:  No edema; No deformity  SKIN: Warm and dry NEUROLOGIC:  Alert and oriented x 3 PSYCHIATRIC:  Normal affect   ASSESSMENT:    1. PAF (paroxysmal atrial fibrillation) ([place])   2. Essential hypertension   3. Mixed hyperlipidemia   4. Abdominal aortic atherosclerosis ([place])   5. Systolic murmur    PLAN:    In order of problems listed above:  1. Maintaining sinus rhythm.  Continue apixaban.  Continue diltiazem. 2. Blood pressure is suboptimally controlled.  Home readings have been ranging greater than 140/90.  The  patient is on good doses of losartan and diltiazem.  I have recommended adding chlorthalidone 25 mg daily.  Advised her  to focus on maintaining good hydration.  We will check a metabolic panel in 2 weeks. 3. Treated with atorvastatin 20 mg daily.  Lipids are followed by her primary physician. 4. The patient has a history of heavy tobacco use, quit in about 2007.  Heavy aortic atherosclerosis has been noted on past imaging studies and there is a question of an aneurysm.  Will order an abdominal aortic ultrasound. 5. Last year's echo reviewed with normal findings and no significant valvular disease.  Suspect benign flow murmur.   Medication Adjustments/Labs and Tests Ordered: Current medicines are reviewed at length with the patient today.  Concerns regarding medicines are outlined above.  No orders of the defined types were placed in this encounter.  No orders of the defined types were placed in this encounter.   There are no Patient Instructions on file for this visit.   Signed, Sherren Mocha, MD  07/16/2018 10:19 AM    [place]

## 2018-07-17 ENCOUNTER — Ambulatory Visit (HOSPITAL_COMMUNITY)
Admission: RE | Admit: 2018-07-17 | Discharge: 2018-07-17 | Disposition: A | Discharge status: Home / Self Care | Payer: Medicare Other | Source: Ambulatory Visit | Attending: Internal Medicine | Admitting: Internal Medicine

## 2018-07-17 DIAGNOSIS — I700 Atherosclerosis of aorta: Secondary | ICD-10-CM | POA: Diagnosis not present

## 2018-07-30 ENCOUNTER — Other Ambulatory Visit: Payer: Medicare Other | Admitting: *Deleted

## 2018-07-30 DIAGNOSIS — I480 Paroxysmal atrial fibrillation: Secondary | ICD-10-CM

## 2018-07-31 LAB — BASIC METABOLIC PANEL
BUN/Creatinine Ratio: 31 — ABNORMAL HIGH (ref 12–28)
BUN: 35 mg/dL — AB (ref 8–27)
CO2: 26 mmol/L (ref 20–29)
CREATININE: 1.13 mg/dL — AB (ref 0.57–1.00)
Calcium: 10.1 mg/dL (ref 8.7–10.3)
Chloride: 98 mmol/L (ref 96–106)
GFR, EST AFRICAN AMERICAN: 57 mL/min/{1.73_m2} — AB (ref >59)
GFR, EST NON AFRICAN AMERICAN: 49 mL/min/{1.73_m2} — AB (ref >59)
Glucose: 107 mg/dL — ABNORMAL HIGH (ref 65–99)
Potassium: 4.2 mmol/L (ref 3.5–5.2)
Sodium: 140 mmol/L (ref 134–144)

## 2018-08-13 ENCOUNTER — Telehealth: Payer: Self-pay | Admitting: Cardiovascular Disease

## 2018-08-13 NOTE — Telephone Encounter (Signed)
The patient has not taken Chlorthalidone since 07/31/18 because it made her feel dizzy and lightheaded. She was instructed to stop the medication and monitor BP. She now calls because her BP is creeping back up. Today, it was 170/90. She took her dilt early (she usually takes it at night). She will take losartan 25 mg later this evening if her BP is still elevated.  She has an appointment with her PCP tomorrow at 1530. She understands she will be called late tomorrow for medication updates prior to giving recommendations. She was grateful for call.

## 2018-08-13 NOTE — Telephone Encounter (Signed)
New Message           Patient is calling today because she never got answer on whether she should stop taking the medication that makes her light headed and dizzy. Patient states she was suppose to get a call and as of now she has not. Pls call and advise.

## 2018-08-15 NOTE — Telephone Encounter (Signed)
Sheila Bruce states her PCP initiated Prazosin 1 mg BID for blood pressure control. She has a rov in a month to recheck BP. Instructed patient to continue to monitor BP and to call if it does not decrease.  She was grateful for assistance.   Med list updated.

## 2018-08-28 ENCOUNTER — Telehealth: Payer: Self-pay | Admitting: *Deleted

## 2018-08-28 NOTE — Telephone Encounter (Signed)
Patient requesting eliquis samples however I do not see that Dr Burt Knack is the ordering physician for this medication. Okay to provide samples? Please advise. Thanks, MI

## 2018-08-28 NOTE — Telephone Encounter (Signed)
Spoke with patient and made her aware that I would place two weeks of samples at the front desk for her to pick up. She verbalized her appreciation.

## 2018-09-06 ENCOUNTER — Emergency Department (HOSPITAL_COMMUNITY)
Admission: EM | Admit: 2018-09-06 | Discharge: 2018-09-06 | Disposition: A | Discharge status: Home / Self Care | Payer: Medicare Other | Attending: Emergency Medicine | Admitting: Emergency Medicine

## 2018-09-06 ENCOUNTER — Encounter (HOSPITAL_COMMUNITY): Payer: Self-pay

## 2018-09-06 ENCOUNTER — Other Ambulatory Visit: Payer: Self-pay

## 2018-09-06 ENCOUNTER — Emergency Department (HOSPITAL_COMMUNITY): Payer: Medicare Other

## 2018-09-06 DIAGNOSIS — Z87891 Personal history of nicotine dependence: Secondary | ICD-10-CM | POA: Insufficient documentation

## 2018-09-06 DIAGNOSIS — M25511 Pain in right shoulder: Secondary | ICD-10-CM | POA: Diagnosis not present

## 2018-09-06 DIAGNOSIS — I10 Essential (primary) hypertension: Secondary | ICD-10-CM | POA: Insufficient documentation

## 2018-09-06 MED ORDER — OXYCODONE HCL 5 MG PO TABS: 5.0000 mg | ORAL_TABLET | ORAL | 0 refills | Status: DC | PRN

## 2018-09-06 NOTE — Discharge Instructions (Signed)
You were evaluated in the Emergency Department and after careful evaluation, we did not find any emergent condition requiring admission or further testing in the hospital.  Your symptoms today seem to be due to a bone spur.  You may also have mild injury to your labrum.  Please use Tylenol at home for pain.  You can use the strong pain medicine provided for pain at night if you are having trouble sleeping.  If your pain is not improved or resolved in 2 weeks, you should discuss your symptoms with an orthopedic specialist.  Please return to the Emergency Department if you experience any worsening of your condition.  We encourage you to follow up with a primary care provider.  Thank you for allowing [place] to be a part of your care.

## 2018-09-06 NOTE — ED Triage Notes (Signed)
Pt reports R shoulder pain. Denies injury, but states that she raked leaved on Thursday and it has worsened since. Pt is unable to lie flat and has very limited ROM on the R. A&Ox4. Ambulatory. Pt is hypertensive, but states that she just started a new drug with her PCP and they are working on lowering it.

## 2018-09-06 NOTE — ED Notes (Signed)
Immobilize affected extremity and DG Shoulder Right Completed was NOT completed, accidentally computer clicked it done.

## 2018-09-06 NOTE — ED Provider Notes (Signed)
[facility] Emergency Department Provider Note MRN:  [id number]  Arrival date & time: 09/07/18     Chief Complaint   Shoulder Pain (R)   History of Present Illness   Sheila Bruce is a 72 y.o. year-old female with a history of A. fib presenting to the ED with chief complaint of shoulder pain.  Patient explains that she was outside raking leaves Thursday morning, 3 days ago.  That night, she began having right shoulder pain.  Upon awakening, she had much worse right shoulder pain, to the point where she is having extreme difficulty moving the shoulder.  Denies recent fevers, no chest pain or shortness of breath, no trauma to the shoulder.  Pain is constant, sharp, worse with motion.  Review of Systems  A complete 10 system review of systems was obtained and all systems are negative except as noted in the HPI and PMH.   Patient's Health History    Past Medical History:  Diagnosis Date  . Anxiety   . Habitual alcohol use   . Heart murmur   . Hx of cardiovascular stress test    a. ETT-Myoview 5/14:  Normal; EF 79%, no ischemia, exercised 10:00.  . Hyperlipidemia   . Hypertension   . PAF (paroxysmal atrial fibrillation) ([place])    a. Echo 4/14: Mild concentric LVH, EF 14-43%, grade 1 diastolic dysfunction, trivial MR, PASP 29;  b. Eliquis 5 bid  . Vertigo     Past Surgical History:  Procedure Laterality Date  . ABDOMINAL HYSTERECTOMY    . BREAST EXCISIONAL BIOPSY    . CYSTOSCOPY    . Laproscopy-Abdominal    . TENDON REPAIR     Left hand    Family History  Problem Relation Age of Onset  . Hypertension Unknown        Mother & father  . Hyperlipidemia Unknown        Mother & father  . Autoimmune disease Unknown        Mother (?liver) and sister, type unclear  . CAD Unknown        father had old MI on EKG  . CAD Unknown        multiple grandparents  . Peripheral vascular disease Mother   . High Cholesterol Father   . Breast cancer Maternal Grandmother    . Breast cancer Paternal Grandmother     Social History   Socioeconomic History  . Marital status: Married    Spouse name: Not on file  . Number of children: Not on file  . Years of education: Not on file  . Highest education level: Not on file  Occupational History  . Not on file  Social Needs  . Financial resource strain: Not on file  . Food insecurity:    Worry: Not on file    Inability: Not on file  . Transportation needs:    Medical: Not on file    Non-medical: Not on file  Tobacco Use  . Smoking status: Former Smoker    Types: Cigarettes    Last attempt to quit: 01/12/2005    Years since quitting: 13.6  . Smokeless tobacco: Never Used  . Tobacco comment: Smoked for 35 yrs - As of 08/2015  chews nicotine gum  Substance and Sexual Activity  . Alcohol use: Yes    Alcohol/week: 1.0 standard drinks    Types: 1 Glasses of wine per week    Comment: Occasional use now - less than one per day  during week, Occasional 2-3 on the weekend.  . Drug use: No  . Sexual activity: Never  Lifestyle  . Physical activity:    Days per week: Not on file    Minutes per session: Not on file  . Stress: Not on file  Relationships  . Social connections:    Talks on phone: Not on file    Gets together: Not on file    Attends religious service: Not on file    Active member of club or organization: Not on file    Attends meetings of clubs or organizations: Not on file    Relationship status: Not on file  . Intimate partner violence:    Fear of current or ex partner: Not on file    Emotionally abused: Not on file    Physically abused: Not on file    Forced sexual activity: Not on file  Other Topics Concern  . Not on file  Social History Narrative  . Not on file     Physical Exam  Vital Signs and Nursing Notes reviewed Vitals:   09/06/18 2104 09/06/18 2315  BP: (!) 176/78 (!) 155/64  Pulse: (!) 103 87  Resp: 16 16  Temp: 98.8 F (37.1 C)   SpO2: 99% 96%    CONSTITUTIONAL:  Well-appearing, NAD NEURO:  Alert and oriented x 3, no focal deficits EYES:  eyes equal and reactive ENT/NECK:  no LAD, no JVD CARDIO: Regular rate, well-perfused, normal S1 and S2 PULM:  CTAB no wheezing or rhonchi GI/GU:  normal bowel sounds, non-distended, non-tender MSK/SPINE: no gross deformities, no edema; tenderness to palpation to the right shoulder with pain elicited with range of motion SKIN:  no rash, atraumatic PSYCH:  Appropriate speech and behavior  Diagnostic and Interventional Summary    Labs Reviewed - No data to display  DG Shoulder Right  Final Result      Medications - No data to display   Procedures  EMERGENCY DEPARTMENT US SOFT TISSUE INTERPRETATION "Study: Limited Soft Tissue Ultrasound"  INDICATIONS: Pain Multiple views of the body part were obtained in real-time with a multi-frequency linear probe  PERFORMED BY: Myself IMAGES ARCHIVED?: Yes SIDE:Right  BODY PART:Shoulder INTERPRETATION:  No effusion  Critical Care  ED Course and Medical Decision Making  I have reviewed the triage vital signs and the nursing notes.  Pertinent labs & imaging results that were available during my care of the patient were reviewed by me and considered in my medical decision making (see below for details).  Favoring overuse injury or muscle strain in this 72 year old female with history of recent new exertional activity of the shoulder.  No rash, no recent fever, no effusion on bedside ultrasound.  Nothing to suggest septic joint.  X-ray reveals osteophyte and possible labrum injury.  Sling for comfort, advised immobilizing the joint as much as possible throughout the day.  We will follow-up with orthopedist if not improved or resolved in 2 weeks.  After the discussed management above, the patient was determined to be safe for discharge.  The patient was in agreement with this plan and all questions regarding their care were answered.  ED return precautions were  discussed and the patient will return to the ED with any significant worsening of condition.  Barth Kirks. Sedonia Small, [place] [email or web address]  Final Clinical Impressions(s) / ED Diagnoses     ICD-10-CM   1. Acute pain of right shoulder M25.511     ED  Discharge Orders         Ordered    oxyCODONE (ROXICODONE) 5 MG immediate release tablet  Every 4 hours PRN     09/06/18 2347             Maudie Flakes, MD 09/07/18 [phone]

## 2018-09-22 ENCOUNTER — Ambulatory Visit
Admission: RE | Admit: 2018-09-22 | Discharge: 2018-09-22 | Disposition: A | Discharge status: Home / Self Care | Payer: Medicare Other | Source: Ambulatory Visit | Attending: Acute Care | Admitting: Acute Care

## 2018-09-22 ENCOUNTER — Ambulatory Visit: Payer: Medicare Other

## 2018-09-22 DIAGNOSIS — F17210 Nicotine dependence, cigarettes, uncomplicated: Secondary | ICD-10-CM

## 2018-09-22 DIAGNOSIS — Z122 Encounter for screening for malignant neoplasm of respiratory organs: Secondary | ICD-10-CM

## 2018-09-26 ENCOUNTER — Telehealth: Payer: Self-pay | Admitting: Acute Care

## 2018-09-26 ENCOUNTER — Other Ambulatory Visit: Payer: Self-pay | Admitting: Acute Care

## 2018-09-26 DIAGNOSIS — Z87891 Personal history of nicotine dependence: Secondary | ICD-10-CM

## 2018-09-26 DIAGNOSIS — Z122 Encounter for screening for malignant neoplasm of respiratory organs: Secondary | ICD-10-CM

## 2018-09-26 NOTE — Telephone Encounter (Signed)
Call made to Cynda Familia is asking whether we will be ordering the repeat Chest CT. I informed Manuela Schwartz the order for the CT was placed and we just the report so that this patient PCP was kept in the loop. Voiced understanding. Nothing further is needed at this time.

## 2019-02-02 ENCOUNTER — Other Ambulatory Visit: Payer: Self-pay | Admitting: Family Medicine

## 2019-02-02 DIAGNOSIS — Z1231 Encounter for screening mammogram for malignant neoplasm of breast: Secondary | ICD-10-CM

## 2019-03-30 ENCOUNTER — Other Ambulatory Visit: Payer: Self-pay

## 2019-03-30 ENCOUNTER — Ambulatory Visit
Admission: RE | Admit: 2019-03-30 | Discharge: 2019-03-30 | Disposition: A | Discharge status: Home / Self Care | Payer: Medicare Other | Source: Ambulatory Visit | Attending: Acute Care | Admitting: Acute Care

## 2019-03-30 ENCOUNTER — Ambulatory Visit
Admission: RE | Admit: 2019-03-30 | Discharge: 2019-03-30 | Disposition: A | Discharge status: Home / Self Care | Payer: Medicare Other | Source: Ambulatory Visit | Attending: Family Medicine | Admitting: Family Medicine

## 2019-03-30 DIAGNOSIS — Z122 Encounter for screening for malignant neoplasm of respiratory organs: Secondary | ICD-10-CM

## 2019-03-30 DIAGNOSIS — Z1231 Encounter for screening mammogram for malignant neoplasm of breast: Secondary | ICD-10-CM

## 2019-03-30 DIAGNOSIS — Z87891 Personal history of nicotine dependence: Secondary | ICD-10-CM

## 2019-04-07 ENCOUNTER — Other Ambulatory Visit: Payer: Self-pay | Admitting: *Deleted

## 2019-04-07 DIAGNOSIS — Z122 Encounter for screening for malignant neoplasm of respiratory organs: Secondary | ICD-10-CM

## 2019-04-07 DIAGNOSIS — Z87891 Personal history of nicotine dependence: Secondary | ICD-10-CM

## 2019-04-27 ENCOUNTER — Telehealth: Payer: Self-pay | Admitting: Acute Care

## 2019-04-27 NOTE — Telephone Encounter (Signed)
Spoke with pt.  She states that she normally doesn't receive a bill for the lung cancer screening CT but she did for this past CT.  She wasn't sure what she needs to do.  I explained that we had to repeat the CT in 6 months this time instead of a year and that could possibly be why.  I advised pt that I would let our billing consultant call her and see if she can help her figure it out.  Pt verbalized understanding.   Kathlee Nations, can you call this pt?

## 2019-04-29 NOTE — Telephone Encounter (Signed)
Kathlee Nations please advise of any update. Thanks.

## 2019-04-30 NOTE — Telephone Encounter (Signed)
Will await response from [place].

## 2019-04-30 NOTE — Telephone Encounter (Signed)
Called patient back.  Her bill is coming from [employer].  I explained that they are separate from [facility] health and I cannot see their billing, but the two most common things that happen are 1: [facility] did not use the correct screening CPT or ICD-10 code, or 2.  The insurance did not process it as an ACA mandated service and they need to re-process the claim.  The patient will call [facility] and if necessary her insurance company to try to get this fixed.  I told her if she runs into any issues I am happy to help as much as I am able.

## 2019-05-04 ENCOUNTER — Telehealth: Payer: Self-pay | Admitting: Acute Care

## 2019-05-04 NOTE — Telephone Encounter (Signed)
direguard this message there is already one in for this.Sheila Bruce

## 2019-05-04 NOTE — Telephone Encounter (Signed)
Noted. Will forward to SG as an FYI. Will sign off.

## 2019-05-06 NOTE — Telephone Encounter (Signed)
Patient checking on billing for March 30, 2019.  Patient phone number is [phone].

## 2019-05-11 NOTE — Telephone Encounter (Signed)
Kathlee Nations, please advise if you have an update on this for pt. Thanks!

## 2019-05-12 NOTE — Telephone Encounter (Signed)
Patient is returning phone call.  Patient phone number is [phone].

## 2019-05-12 NOTE — Telephone Encounter (Signed)
I have called and spoken to the patient, please see first phone note.

## 2019-05-14 NOTE — Telephone Encounter (Signed)
Patient contacted the [place], ([facility] I think) and they told her we ordered a CT chest w/o contrast, not the Lung cancer screening code.  When I look at the order, it looks like we ordered the screening code, but Judson Roch, would you check behind me to make sure we did, in fact, order the screening code?

## 2019-05-14 NOTE — Telephone Encounter (Signed)
Judson Roch the code for the 1 time a year is G0297 and a 3 month, 6 month is [phone] which is the same code as CT chest without. It may not be covered at 100%. Per [facility] the bill that the patient received showed $110.00 was her copay. I have always used [phone] when getting authorizations from insurance for CT chest LCS Nodule f/u with out contrast.

## 2019-05-14 NOTE — Telephone Encounter (Signed)
Rodena Piety, Can you confirm the order was a LDCT Lung cancer screening follow up and not a CT Chest without contrast. Also please see who actually placed the order. Thanks so much

## 2019-05-14 NOTE — Telephone Encounter (Signed)
Thanks so much [place]. I really appreciate you.

## 2019-05-14 NOTE — Telephone Encounter (Signed)
I just called the patient and tried to explain this to her. She stated that she had already called [facility] about the bill. [facility] was having their billing go over the bill again. Sheila Bruce stated that her husband had already sent in the payment for the bill.

## 2019-07-29 ENCOUNTER — Encounter (INDEPENDENT_AMBULATORY_CARE_PROVIDER_SITE_OTHER): Payer: Self-pay

## 2019-07-29 ENCOUNTER — Encounter: Payer: Self-pay | Admitting: Cardiology

## 2019-07-29 ENCOUNTER — Ambulatory Visit: Payer: Medicare Other | Admitting: Cardiology

## 2019-07-29 ENCOUNTER — Other Ambulatory Visit: Payer: Self-pay

## 2019-07-29 VITALS — BP 132/58 | HR 87 | Ht 60.0 in | Wt 150.0 lb

## 2019-07-29 DIAGNOSIS — R011 Cardiac murmur, unspecified: Secondary | ICD-10-CM

## 2019-07-29 DIAGNOSIS — E785 Hyperlipidemia, unspecified: Secondary | ICD-10-CM

## 2019-07-29 DIAGNOSIS — I10 Essential (primary) hypertension: Secondary | ICD-10-CM

## 2019-07-29 DIAGNOSIS — I480 Paroxysmal atrial fibrillation: Secondary | ICD-10-CM

## 2019-07-29 NOTE — Patient Instructions (Signed)
Medication Instructions:  Your physician recommends that you continue on your current medications as directed. Please refer to the Current Medication list given to you today.  *If you need a refill on your cardiac medications before your next appointment, please call your pharmacy*  Lab Work: None   If you have labs (blood work) drawn today and your tests are completely normal, you will receive your results only by: Marland Kitchen MyChart Message (if you have MyChart) OR . A paper copy in the mail If you have any lab test that is abnormal or we need to change your treatment, we will call you to review the results.  Testing/Procedures: None   Follow-Up: At [facility], you and your health needs are our priority.  As part of our continuing mission to provide you with exceptional heart care, we have created designated Provider Care Teams.  These Care Teams include your primary Cardiologist (physician) and Advanced Practice Providers (APPs -  Physician Assistants and Nurse Practitioners) who all work together to provide you with the care you need, when you need it.  Your next appointment:   12 months  The format for your next appointment:   Either In Person or Virtual  Provider:   You may see Sherren Mocha, MD or one of the following Advanced Practice Providers on your designated Care Team:    Richardson Dopp, PA-C  [place], [place]  Daune Perch, NP   Other Instructions

## 2019-07-29 NOTE — Progress Notes (Signed)
Cardiology Office Note:    Date:  07/29/2019   ID:  Sheila Bruce, Sheila Bruce March 08, 1946, MRN [id number]  PCP:  Kathyrn Lass, MD  Cardiologist:  Sherren Mocha, MD  Referring MD: Kathyrn Lass, MD   Chief Complaint  Patient presents with  . Follow-up  . Atrial Fibrillation  . Hypertension    History of Present Illness:    Sheila Bruce is a 73 y.o. female with a past medical history significant for paroxysmal atrial fibrillation on apixaban, hypertension, hyperlipidemia, systolic murmur, abdominal aortic atherosclerosis, tobacco use-quit in 2007.  Seen by Dr. Burt Knack on 07/16/2018 and was maintaining sinus rhythm on diltiazem.  Her blood pressure was suboptimally controlled, 140/90.  She was continued on losartan and diltiazem with addition of chlorthalidone 25 mg daily.  She underwent ultrasound for evaluation of possible AAA and was found to have no evidence of abdominal aortic aneurysm.  The patient is being seen today for annual follow-up. She does housework, Haematologist. Her yoga and Zumba classes have been on hold due to covid. She does play golf which is better now that the weather is not so hot. She says being on the HCTZ has made her more intolerant to exertion in the heat. She has no chest discomfort or shortness of breath with exertion. She occ has brief shortness of breath not related to exercise and she wonders if this may be afib She has had no palpitations, orthopnea, PND or edema.  She has mild orthostatic lightheadedness when she first stands up about 1 hour after taking her HCTZ. She is aware to change positions slowly and is not bothred by it much.  She has cut out all caffeine, including chocolate.  Her sister was recently diagnosed with idiopathic cardiomyopathy and aortic stenosis probably requiring catheter-based aortic valve replacement.  She says her sister is very overweight and has several comorbidities.  She was wondering about her own valve.  I reviewed her  echocardiogram from 2018 showing no aortic valve disease.  She was reassured.   Cardiac studies   Echocardiogram May 31, 2017: Left ventricle: The cavity size was normal. Systolic function was normal. The estimated ejection fraction was in the range of 60% to 65%. Wall motion was normal; there were no regional wall motion abnormalities. The transmitral flow pattern was normal. The deceleration time of the early transmitral flow velocity was normal. The pulmonary vein flow pattern was normal. The tissue Doppler parameters were normal. Left ventricular diastolic function parameters were normal.  Past Medical History:  Diagnosis Date  . Anxiety   . Habitual alcohol use   . Heart murmur   . Hx of cardiovascular stress test    a. ETT-Myoview 5/14:  Normal; EF 79%, no ischemia, exercised 10:00.  . Hyperlipidemia   . Hypertension   . PAF (paroxysmal atrial fibrillation) ([place])    a. Echo 4/14: Mild concentric LVH, EF 123456, grade 1 diastolic dysfunction, trivial MR, PASP 29;  b. Eliquis 5 bid  . Vertigo     Past Surgical History:  Procedure Laterality Date  . ABDOMINAL HYSTERECTOMY    . BREAST EXCISIONAL BIOPSY    . CYSTOSCOPY    . Laproscopy-Abdominal    . TENDON REPAIR     Left hand    Current Medications: Current Meds  Medication Sig  . apixaban (ELIQUIS) 5 MG TABS tablet Take 1 tablet (5 mg total) by mouth 2 (two) times daily.  Marland Kitchen atorvastatin (LIPITOR) 20 MG tablet Take 1 tablet (20 mg total) by  mouth daily at 6 PM.  . conjugated estrogens (PREMARIN) vaginal cream Place 1 Applicatorful vaginally as directed.   . diltiazem (CARDIZEM CD) 240 MG 24 hr capsule Take 240 mg by mouth daily.  . fluticasone (FLONASE) 50 MCG/ACT nasal spray Place 2 sprays into the nose daily.  . hydrochlorothiazide (HYDRODIURIL) 25 MG tablet Take 25 mg by mouth every morning.  Marland Kitchen ibuprofen (ADVIL) 200 MG tablet Take 200 mg by mouth as needed for pain.  Marland Kitchen loratadine (CLARITIN) 10 MG tablet Take  10 mg by mouth daily.  Marland Kitchen losartan (COZAAR) 100 MG tablet Take 100 mg by mouth daily.  . Multiple Vitamin (MULTIVITAMIN) tablet Take 1 tablet by mouth daily.     Allergies:   Ciprocinonide [fluocinolone], Codeine, Ciprofloxacin, and Doxycycline   Social History   Socioeconomic History  . Marital status: Married    Spouse name: Not on file  . Number of children: Not on file  . Years of education: Not on file  . Highest education level: Not on file  Occupational History  . Not on file  Social Needs  . Financial resource strain: Not on file  . Food insecurity    Worry: Not on file    Inability: Not on file  . Transportation needs    Medical: Not on file    Non-medical: Not on file  Tobacco Use  . Smoking status: Former Smoker    Types: Cigarettes    Quit date: 01/12/2005    Years since quitting: 14.5  . Smokeless tobacco: Never Used  . Tobacco comment: Smoked for 35 yrs - As of 08/2015  chews nicotine gum  Substance and Sexual Activity  . Alcohol use: Yes    Alcohol/week: 1.0 standard drinks    Types: 1 Glasses of wine per week    Comment: Occasional use now - less than one per day during week, Occasional 2-3 on the weekend.  . Drug use: No  . Sexual activity: Never  Lifestyle  . Physical activity    Days per week: Not on file    Minutes per session: Not on file  . Stress: Not on file  Relationships  . Social Herbalist on phone: Not on file    Gets together: Not on file    Attends religious service: Not on file    Active member of club or organization: Not on file    Attends meetings of clubs or organizations: Not on file    Relationship status: Not on file  Other Topics Concern  . Not on file  Social History Narrative  . Not on file     Family History: The patient's family history includes Autoimmune disease in an other family member; Breast cancer in her maternal grandmother and paternal grandmother; CAD in some other family members; High Cholesterol  in her father; Hyperlipidemia in an other family member; Hypertension in an other family member; Peripheral vascular disease in her mother. ROS:   Please see the history of present illness.     All other systems reviewed and are negative.   EKG:  EKG is ordered today.  The ekg ordered today demonstrates normal sinus rhythm, 71 bpm, QTC 439  Recent Labs: 07/30/2018: BUN 35; Creatinine, Ser 1.13; Potassium 4.2; Sodium 140   Recent Lipid Panel    Component Value Date/Time   CHOL  04/01/2010 0445    172        ATP III CLASSIFICATION:  <200     mg/dL  Desirable  200-239  mg/dL   Borderline High  >=240    mg/dL   High          TRIG 116 04/01/2010 0445   HDL 68 04/01/2010 0445   CHOLHDL 2.5 04/01/2010 0445   VLDL 23 04/01/2010 0445   LDLCALC  04/01/2010 0445    81        Total Cholesterol/HDL:CHD Risk Coronary Heart Disease Risk Table                     Men   Women  1/2 Average Risk   3.4   3.3  Average Risk       5.0   4.4  2 X Average Risk   9.6   7.1  3 X Average Risk  23.4   11.0        Use the calculated Patient Ratio above and the CHD Risk Table to determine the patient's CHD Risk.        ATP III CLASSIFICATION (LDL):  <100     mg/dL   Optimal  100-129  mg/dL   Near or Above                    Optimal  130-159  mg/dL   Borderline  160-189  mg/dL   High  >190     mg/dL   Very High    Physical Exam:    VS:  BP (!) 132/58   Pulse 87   Ht 5' (1.524 m)   Wt 150 lb (68 kg)   SpO2 97%   BMI 29.29 kg/m     Wt Readings from Last 6 Encounters:  07/29/19 150 lb (68 kg)  07/16/18 148 lb (67.1 kg)  05/15/17 143 lb 6.4 oz (65 kg)  10/23/16 141 lb (64 kg)  09/19/15 135 lb 12 oz (61.6 kg)  06/18/15 137 lb (62.1 kg)     Physical Exam  Constitutional: She is oriented to person, place, and time. She appears well-developed and well-nourished. No distress.  HENT:  Head: Normocephalic and atraumatic.  Neck: Normal range of motion. Neck supple. No JVD present.   Cardiovascular: Normal rate, regular rhythm and intact distal pulses. Exam reveals no gallop and no friction rub.  Murmur heard.  Harsh systolic murmur is present with a grade of 2/6 at the upper right sternal border radiating to the neck. Pulmonary/Chest: Effort normal and breath sounds normal. No respiratory distress. She has no wheezes. She has no rales.  Abdominal: Soft. Bowel sounds are normal.  Musculoskeletal: Normal range of motion.        General: No edema.  Neurological: She is alert and oriented to person, place, and time.  Skin: Skin is warm and dry.  Psychiatric: She has a normal mood and affect. Her behavior is normal. Judgment and thought content normal.  Vitals reviewed.    ASSESSMENT:    1. PAF (paroxysmal atrial fibrillation) ([place])   2. Essential (primary) hypertension   3. Hyperlipidemia, unspecified hyperlipidemia type   4. Systolic murmur    PLAN:    In order of problems listed above:  Paroxysmal atrial fibrillation -Maintaining sinus rhythm on diltiazem -On apixaban for stroke risk reduction. No unusual bleeding.   Hypertension -On diltiazem, losartan, hydrochlorothiazide -Blood pressure well controlled on current medications  Hyperlipidemia -On atorvastatin 20 mg daily.  Lipid panel in 10/2018 showed LDL of 106.  Adequate control considering she has no history of MI or stroke.  Murmur -Echo from 2018 was reviewed and showed no significant valvular disease.  Per Dr. Burt Knack at last visit, suspect benign flow murmur.  Medication Adjustments/Labs and Tests Ordered: Current medicines are reviewed at length with the patient today.  Concerns regarding medicines are outlined above. Labs and tests ordered and medication changes are outlined in the patient instructions below:  Patient Instructions  Medication Instructions:  Your physician recommends that you continue on your current medications as directed. Please refer to the Current Medication list given to  you today.  *If you need a refill on your cardiac medications before your next appointment, please call your pharmacy*  Lab Work: None   If you have labs (blood work) drawn today and your tests are completely normal, you will receive your results only by: Marland Kitchen MyChart Message (if you have MyChart) OR . A paper copy in the mail If you have any lab test that is abnormal or we need to change your treatment, we will call you to review the results.  Testing/Procedures: None   Follow-Up: At [facility], you and your health needs are our priority.  As part of our continuing mission to provide you with exceptional heart care, we have created designated Provider Care Teams.  These Care Teams include your primary Cardiologist (physician) and Advanced Practice Providers (APPs -  Physician Assistants and Nurse Practitioners) who all work together to provide you with the care you need, when you need it.  Your next appointment:   12 months  The format for your next appointment:   Either In Person or Virtual  Provider:   You may see Sherren Mocha, MD or one of the following Advanced Practice Providers on your designated Care Team:    Richardson Dopp, PA-C  [place], [place]  Daune Perch, NP   Other Instructions      Signed, Daune Perch, NP  07/29/2019 2:08 PM    [place]

## 2019-08-05 NOTE — Addendum Note (Signed)
Addended by: De Burrs on: 08/05/2019 02:08 PM   Modules accepted: Orders

## 2019-08-14 ENCOUNTER — Other Ambulatory Visit: Payer: Self-pay

## 2019-08-14 DIAGNOSIS — Z20822 Contact with and (suspected) exposure to covid-19: Secondary | ICD-10-CM

## 2019-08-15 LAB — NOVEL CORONAVIRUS, NAA: SARS-CoV-2, NAA: NOT DETECTED

## 2020-03-03 ENCOUNTER — Other Ambulatory Visit: Payer: Self-pay | Admitting: Family Medicine

## 2020-03-03 DIAGNOSIS — Z1231 Encounter for screening mammogram for malignant neoplasm of breast: Secondary | ICD-10-CM

## 2020-03-11 ENCOUNTER — Ambulatory Visit: Payer: Medicare Other | Admitting: Cardiovascular Disease

## 2020-03-30 ENCOUNTER — Ambulatory Visit
Admission: RE | Admit: 2020-03-30 | Discharge: 2020-03-30 | Disposition: A | Discharge status: Home / Self Care | Payer: Medicare Other | Source: Ambulatory Visit | Attending: Family Medicine | Admitting: Family Medicine

## 2020-03-30 ENCOUNTER — Other Ambulatory Visit: Payer: Self-pay

## 2020-03-30 DIAGNOSIS — Z1231 Encounter for screening mammogram for malignant neoplasm of breast: Secondary | ICD-10-CM

## 2020-04-21 ENCOUNTER — Ambulatory Visit: Payer: Medicare Other | Admitting: Cardiovascular Disease

## 2020-05-09 DIAGNOSIS — J029 Acute pharyngitis, unspecified: Secondary | ICD-10-CM | POA: Insufficient documentation

## 2020-05-11 ENCOUNTER — Telehealth: Payer: Self-pay | Admitting: Acute Care

## 2020-05-12 ENCOUNTER — Inpatient Hospital Stay: Admission: RE | Admit: 2020-05-12 | Payer: Medicare Other | Source: Ambulatory Visit

## 2020-05-12 NOTE — Telephone Encounter (Signed)
Spoke with pt . She states that [place] told her there would be a copay for her annual lung screening CT. I advised pt that my understanding is that Medicare pays for yearly low dose CT at 100 %. I gave pt the billing code that we use for the CT. Pt is going to call her insurance to check. I advised pt to call me back if any other questions or concerns.

## 2020-05-13 ENCOUNTER — Ambulatory Visit
Admission: RE | Admit: 2020-05-13 | Discharge: 2020-05-13 | Disposition: A | Discharge status: Home / Self Care | Payer: Medicare Other | Source: Ambulatory Visit | Attending: Acute Care | Admitting: Acute Care

## 2020-05-13 DIAGNOSIS — Z122 Encounter for screening for malignant neoplasm of respiratory organs: Secondary | ICD-10-CM

## 2020-05-13 DIAGNOSIS — Z87891 Personal history of nicotine dependence: Secondary | ICD-10-CM

## 2020-05-16 NOTE — Progress Notes (Signed)
Please call patient and let them  know their  low dose Ct was read as a Lung RADS 2: nodules that are benign in appearance and behavior with a very low likelihood of becoming a clinically active cancer due to size or lack of growth. Pt. Has quit > 15 years ago as of this year and will no longer qualify for lung cancer screening per the current guidelines. Please thank her for screening. If she would like to have follow up scans she can do so through her PCP at [facility] for an out of pocket cost of $299.00. Thanks so much

## 2020-08-01 ENCOUNTER — Other Ambulatory Visit: Payer: Self-pay

## 2020-08-01 ENCOUNTER — Ambulatory Visit: Payer: Medicare Other | Admitting: Cardiovascular Disease

## 2020-08-01 ENCOUNTER — Encounter: Payer: Self-pay | Admitting: Cardiovascular Disease

## 2020-08-01 VITALS — BP 132/70 | HR 76 | Ht 60.0 in | Wt 150.8 lb

## 2020-08-01 DIAGNOSIS — I350 Nonrheumatic aortic (valve) stenosis: Secondary | ICD-10-CM | POA: Diagnosis not present

## 2020-08-01 DIAGNOSIS — I480 Paroxysmal atrial fibrillation: Secondary | ICD-10-CM

## 2020-08-01 DIAGNOSIS — E782 Mixed hyperlipidemia: Secondary | ICD-10-CM

## 2020-08-01 DIAGNOSIS — R0989 Other specified symptoms and signs involving the circulatory and respiratory systems: Secondary | ICD-10-CM | POA: Diagnosis not present

## 2020-08-01 DIAGNOSIS — I10 Essential (primary) hypertension: Secondary | ICD-10-CM

## 2020-08-01 NOTE — Progress Notes (Signed)
Cardiology Office Note:    Date:  08/01/2020   ID:  Sheila, Bruce October 08, 1946, MRN [id number]  PCP:  Kathyrn Lass, MD  [facility] Cardiologist:  Sherren Mocha, MD  [place] Electrophysiologist:  None   Referring MD: Kathyrn Lass, MD   Chief Complaint  Patient presents with  . Aortic Stenosis    History of Present Illness:    Sheila Bruce is a 74 y.o. female with a hx of paroxysmal atrial fibrillation, presenting for follow-up evaluation.  The patient has maintained sinus rhythm over recent years.  She is chronically anticoagulated with apixaban.  Comorbid conditions include aortic sclerosis, hypertension, mixed hyperlipidemia, and history of tobacco abuse.  Patient is here alone today.  He has been doing well from a cardiac perspective.  She denies chest pain, chest pressure, or shortness of breath with exertion.  She has had no edema or heart palpitations.  She has not had any stroke or TIA symptoms.  She denies bleeding problems on apixaban therapy.  She has had a few episodes of resting shortness of breath and she has felt her pulse at these times and noted that it has been normal.  She denies orthopnea or PND.  Past Medical History:  Diagnosis Date  . Anxiety   . Habitual alcohol use   . Heart murmur   . Hx of cardiovascular stress test    a. ETT-Myoview 5/14:  Normal; EF 79%, no ischemia, exercised 10:00.  . Hyperlipidemia   . Hypertension   . PAF (paroxysmal atrial fibrillation) ([place])    a. Echo 4/14: Mild concentric LVH, EF 97-41%, grade 1 diastolic dysfunction, trivial MR, PASP 29;  b. Eliquis 5 bid  . Vertigo     Past Surgical History:  Procedure Laterality Date  . ABDOMINAL HYSTERECTOMY    . BREAST EXCISIONAL BIOPSY    . CYSTOSCOPY    . Laproscopy-Abdominal    . TENDON REPAIR     Left hand    Current Medications: Current Meds  Medication Sig  . apixaban (ELIQUIS) 5 MG TABS tablet Take 1 tablet (5 mg total) by mouth 2 (two) times daily.   Marland Kitchen atorvastatin (LIPITOR) 20 MG tablet Take 1 tablet (20 mg total) by mouth daily at 6 PM.  . Cholecalciferol (VITAMIN D3) 50 MCG (2000 UT) CAPS Take 1 capsule by mouth daily.  Marland Kitchen conjugated estrogens (PREMARIN) vaginal cream Place 1 Applicatorful vaginally as directed.   . diltiazem (CARDIZEM CD) 240 MG 24 hr capsule Take 240 mg by mouth daily.  . fluticasone (FLONASE) 50 MCG/ACT nasal spray Place 2 sprays into the nose daily.  . hydrochlorothiazide (HYDRODIURIL) 25 MG tablet Take 25 mg by mouth every morning.  Marland Kitchen ibuprofen (ADVIL) 200 MG tablet Take 200 mg by mouth as needed for pain.  Marland Kitchen loratadine (CLARITIN) 10 MG tablet Take 10 mg by mouth daily.  Marland Kitchen losartan (COZAAR) 100 MG tablet Take 100 mg by mouth daily.  . Multiple Vitamin (MULTIVITAMIN) tablet Take 1 tablet by mouth daily.     Allergies:   Ciprocinonide [fluocinolone], Codeine, Ciprofloxacin, and Doxycycline   Social History   Socioeconomic History  . Marital status: Married    Spouse name: Not on file  . Number of children: Not on file  . Years of education: Not on file  . Highest education level: Not on file  Occupational History  . Not on file  Tobacco Use  . Smoking status: Former Smoker    Types: Cigarettes  Quit date: 01/12/2005    Years since quitting: 15.5  . Smokeless tobacco: Never Used  . Tobacco comment: Smoked for 35 yrs - As of 08/2015  chews nicotine gum  Substance and Sexual Activity  . Alcohol use: Yes    Alcohol/week: 1.0 standard drink    Types: 1 Glasses of wine per week    Comment: Occasional use now - less than one per day during week, Occasional 2-3 on the weekend.  . Drug use: No  . Sexual activity: Never  Other Topics Concern  . Not on file  Social History Narrative  . Not on file   Social Determinants of Health   Financial Resource Strain:   . Difficulty of Paying Living Expenses: Not on file  Food Insecurity:   . Worried About Charity fundraiser in the Last Year: Not on file  . Ran  Out of Food in the Last Year: Not on file  Transportation Needs:   . Lack of Transportation (Medical): Not on file  . Lack of Transportation (Non-Medical): Not on file  Physical Activity:   . Days of Exercise per Week: Not on file  . Minutes of Exercise per Session: Not on file  Stress:   . Feeling of Stress : Not on file  Social Connections:   . Frequency of Communication with Friends and Family: Not on file  . Frequency of Social Gatherings with Friends and Family: Not on file  . Attends Religious Services: Not on file  . Active Member of Clubs or Organizations: Not on file  . Attends Archivist Meetings: Not on file  . Marital Status: Not on file     Family History: The patient's family history includes Autoimmune disease in an other family member; Breast cancer in her maternal grandmother and paternal grandmother; CAD in some other family members; High Cholesterol in her father; Hyperlipidemia in an other family member; Hypertension in an other family member; Peripheral vascular disease in her mother.  ROS:   Please see the history of present illness.    All other systems reviewed and are negative.  EKGs/Labs/Other Studies Reviewed:    The following studies were reviewed today: AAA Duplex 07-17-2018: Summary:  Abdominal Aorta: No evidence of an abdominal aortic aneurysm was  visualized. The largest aortic measurement is 2.5 cm.  Stenosis: No evidence of focal stenosis noted throughout the aorta and  bilateral iliac arteries.   Echo 05-31-2017: Study Conclusions   - Left ventricle: The cavity size was normal. Systolic function was  normal. The estimated ejection fraction was in the range of 60%  to 65%. Wall motion was normal; there were no regional wall  motion abnormalities. Left ventricular diastolic function  parameters were normal.  - Atrial septum: No defect or patent foramen ovale was identified.   EKG:  EKG is ordered today.  The ekg ordered  today demonstrates NSR 72 bpm, within normal limits  Recent Labs: No results found for requested labs within last 8760 hours.  Recent Lipid Panel    Component Value Date/Time   CHOL  04/01/2010 0445    172        ATP III CLASSIFICATION:  <200     mg/dL   Desirable  200-239  mg/dL   Borderline High  >=240    mg/dL   High          TRIG 116 04/01/2010 0445   HDL 68 04/01/2010 0445   CHOLHDL 2.5 04/01/2010 0445   VLDL  23 04/01/2010 0445   LDLCALC  04/01/2010 0445    81        Total Cholesterol/HDL:CHD Risk Coronary Heart Disease Risk Table                     Men   Women  1/2 Average Risk   3.4   3.3  Average Risk       5.0   4.4  2 X Average Risk   9.6   7.1  3 X Average Risk  23.4   11.0        Use the calculated Patient Ratio above and the CHD Risk Table to determine the patient's CHD Risk.        ATP III CLASSIFICATION (LDL):  <100     mg/dL   Optimal  100-129  mg/dL   Near or Above                    Optimal  130-159  mg/dL   Borderline  160-189  mg/dL   High  >190     mg/dL   Very High     Risk Assessment/Calculations:     CHA2DS2-VASc Score = 4  This indicates a 4.8% annual risk of stroke. The patient's score is based upon: CHF History: 0 HTN History: 1 Diabetes History: 0 Stroke History: 0 Vascular Disease History: 1 Age Score: 1 Gender Score: 1      Physical Exam:    VS:  BP 132/70   Pulse 76   Ht 5' (1.524 m)   Wt 150 lb 12.8 oz (68.4 kg)   SpO2 98%   BMI 29.45 kg/m     Wt Readings from Last 3 Encounters:  08/01/20 150 lb 12.8 oz (68.4 kg)  07/29/19 150 lb (68 kg)  07/16/18 148 lb (67.1 kg)     GEN:  Well nourished, well developed in no acute distress HEENT: Normal NECK: No JVD;BL carotid bruits LYMPHATICS: No lymphadenopathy CARDIAC: RRR, 2/6 harsh early peaking crescendo decrescendo murmur at the right upper sternal border RESPIRATORY:  Clear to auscultation without rales, wheezing or rhonchi  ABDOMEN: Soft, non-tender,  non-distended MUSCULOSKELETAL:  No edema; No deformity  SKIN: Warm and dry NEUROLOGIC:  Alert and oriented x 3 PSYCHIATRIC:  Normal affect   ASSESSMENT:    1. PAF (paroxysmal atrial fibrillation) ([place])   2. Mixed hyperlipidemia   3. Nonrheumatic aortic valve stenosis   4. Bilateral carotid bruits   5. Essential hypertension    PLAN:    In order of problems listed above:  1. Maintaining sinus rhythm.  Continue Cardizem CD.  Continue anticoagulation with apixaban.  Most recent echo reviewed.  Will repeat next year. 2. Treated with atorvastatin 20 mg daily.  Last lipids reviewed with HDL 73, LDL 111 mg/dL.  ALT 44. 3. Last echo reviewed with no significant aortic stenosis.  Exam suggest mild aortic stenosis.  We will repeat an echocardiogram next year prior to her office visit.  We discussed natural history of aortic stenosis today.  She does not have any cardinal symptoms. 4. No significant carotid disease noted on most recent duplex scan.  Will repeat next year at the time of her office visit.  She will continue on medical therapy with apixaban and a statin drug. 5. Blood pressure well controlled on current therapy.    Medication Adjustments/Labs and Tests Ordered: Current medicines are reviewed at length with the patient today.  Concerns regarding medicines are outlined  above.  Orders Placed This Encounter  Procedures  . EKG 12-Lead  . ECHOCARDIOGRAM COMPLETE  . VAS US CAROTID   No orders of the defined types were placed in this encounter.   Patient Instructions  Medication Instructions:  Your provider recommends that you continue on your current medications as directed. Please refer to the Current Medication list given to you today.   *If you need a refill on your cardiac medications before your next appointment, please call your pharmacy*  Testing/Procedures: Your physician has requested that you have a carotid duplex in one year. This test is an ultrasound of the carotid  arteries in your neck. It looks at blood flow through these arteries that supply the brain with blood. Allow one hour for this exam. There are no restrictions or special instructions.  Your provider has requested that you have an echocardiogram in one year. Echocardiography is a painless test that uses sound waves to create images of your heart. It provides your doctor with information about the size and shape of your heart and how well your heart's chambers and valves are working. This procedure takes approximately one hour. There are no restrictions for this procedure.  Follow-Up: At [facility], you and your health needs are our priority.  As part of our continuing mission to provide you with exceptional heart care, we have created designated Provider Care Teams.  These Care Teams include your primary Cardiologist (physician) and Advanced Practice Providers (APPs -  Physician Assistants and Nurse Practitioners) who all work together to provide you with the care you need, when you need it. Your next appointment:   12 month(s) The format for your next appointment:   In Person Provider:   You may see Sherren Mocha, MD or one of the following Advanced Practice Providers on your designated Care Team:    Richardson Dopp, PA-C  Robbie Lis, [place]      Signed, Sherren Mocha, MD  08/01/2020 5:12 PM    [place]

## 2020-08-01 NOTE — Patient Instructions (Signed)
Medication Instructions:  Your provider recommends that you continue on your current medications as directed. Please refer to the Current Medication list given to you today.   *If you need a refill on your cardiac medications before your next appointment, please call your pharmacy*  Testing/Procedures: Your physician has requested that you have a carotid duplex in one year. This test is an ultrasound of the carotid arteries in your neck. It looks at blood flow through these arteries that supply the brain with blood. Allow one hour for this exam. There are no restrictions or special instructions.  Your provider has requested that you have an echocardiogram in one year. Echocardiography is a painless test that uses sound waves to create images of your heart. It provides your doctor with information about the size and shape of your heart and how well your heart's chambers and valves are working. This procedure takes approximately one hour. There are no restrictions for this procedure.  Follow-Up: At [facility], you and your health needs are our priority.  As part of our continuing mission to provide you with exceptional heart care, we have created designated Provider Care Teams.  These Care Teams include your primary Cardiologist (physician) and Advanced Practice Providers (APPs -  Physician Assistants and Nurse Practitioners) who all work together to provide you with the care you need, when you need it. Your next appointment:   12 month(s) The format for your next appointment:   In Person Provider:   You may see Sherren Mocha, MD or one of the following Advanced Practice Providers on your designated Care Team:    Richardson Dopp, PA-C  [place], [place]

## 2020-10-18 DIAGNOSIS — L821 Other seborrheic keratosis: Secondary | ICD-10-CM | POA: Diagnosis not present

## 2020-10-18 DIAGNOSIS — L820 Inflamed seborrheic keratosis: Secondary | ICD-10-CM | POA: Diagnosis not present

## 2020-11-14 DIAGNOSIS — L821 Other seborrheic keratosis: Secondary | ICD-10-CM | POA: Diagnosis not present

## 2020-11-14 DIAGNOSIS — L814 Other melanin hyperpigmentation: Secondary | ICD-10-CM | POA: Diagnosis not present

## 2020-11-14 DIAGNOSIS — D225 Melanocytic nevi of trunk: Secondary | ICD-10-CM | POA: Diagnosis not present

## 2020-11-15 DIAGNOSIS — L821 Other seborrheic keratosis: Secondary | ICD-10-CM | POA: Diagnosis not present

## 2020-11-15 DIAGNOSIS — L814 Other melanin hyperpigmentation: Secondary | ICD-10-CM | POA: Diagnosis not present

## 2020-11-15 DIAGNOSIS — D225 Melanocytic nevi of trunk: Secondary | ICD-10-CM | POA: Diagnosis not present

## 2020-12-01 DIAGNOSIS — E781 Pure hyperglyceridemia: Secondary | ICD-10-CM | POA: Diagnosis not present

## 2020-12-01 DIAGNOSIS — J439 Emphysema, unspecified: Secondary | ICD-10-CM | POA: Diagnosis not present

## 2020-12-01 DIAGNOSIS — I10 Essential (primary) hypertension: Secondary | ICD-10-CM | POA: Diagnosis not present

## 2020-12-01 DIAGNOSIS — I2510 Atherosclerotic heart disease of native coronary artery without angina pectoris: Secondary | ICD-10-CM | POA: Diagnosis not present

## 2020-12-01 DIAGNOSIS — E7800 Pure hypercholesterolemia, unspecified: Secondary | ICD-10-CM | POA: Diagnosis not present

## 2020-12-30 DIAGNOSIS — Z0000 Encounter for general adult medical examination without abnormal findings: Secondary | ICD-10-CM | POA: Diagnosis not present

## 2021-01-06 DIAGNOSIS — E7800 Pure hypercholesterolemia, unspecified: Secondary | ICD-10-CM | POA: Diagnosis not present

## 2021-01-06 DIAGNOSIS — I700 Atherosclerosis of aorta: Secondary | ICD-10-CM | POA: Diagnosis not present

## 2021-01-06 DIAGNOSIS — R7303 Prediabetes: Secondary | ICD-10-CM | POA: Diagnosis not present

## 2021-01-06 DIAGNOSIS — R6889 Other general symptoms and signs: Secondary | ICD-10-CM | POA: Diagnosis not present

## 2021-01-06 DIAGNOSIS — I10 Essential (primary) hypertension: Secondary | ICD-10-CM | POA: Diagnosis not present

## 2021-01-06 DIAGNOSIS — I2510 Atherosclerotic heart disease of native coronary artery without angina pectoris: Secondary | ICD-10-CM | POA: Diagnosis not present

## 2021-01-06 DIAGNOSIS — R5383 Other fatigue: Secondary | ICD-10-CM | POA: Diagnosis not present

## 2021-01-19 DIAGNOSIS — E781 Pure hyperglyceridemia: Secondary | ICD-10-CM | POA: Diagnosis not present

## 2021-01-19 DIAGNOSIS — I2510 Atherosclerotic heart disease of native coronary artery without angina pectoris: Secondary | ICD-10-CM | POA: Diagnosis not present

## 2021-01-19 DIAGNOSIS — E7800 Pure hypercholesterolemia, unspecified: Secondary | ICD-10-CM | POA: Diagnosis not present

## 2021-01-19 DIAGNOSIS — I10 Essential (primary) hypertension: Secondary | ICD-10-CM | POA: Diagnosis not present

## 2021-01-19 DIAGNOSIS — J439 Emphysema, unspecified: Secondary | ICD-10-CM | POA: Diagnosis not present

## 2021-03-08 ENCOUNTER — Telehealth: Payer: Self-pay

## 2021-03-08 DIAGNOSIS — I350 Nonrheumatic aortic (valve) stenosis: Secondary | ICD-10-CM

## 2021-03-08 DIAGNOSIS — I480 Paroxysmal atrial fibrillation: Secondary | ICD-10-CM

## 2021-03-08 NOTE — Telephone Encounter (Signed)
Called to arrange 1 year echo and office visit with Dr. Burt Knack. Scheduled patient for visit 08/23/21. Scheduled echo 11/14. She understands she will be called to arrange carotid US prior to visit. She was grateful for call and agrees with plan.

## 2021-04-05 DIAGNOSIS — I10 Essential (primary) hypertension: Secondary | ICD-10-CM | POA: Diagnosis not present

## 2021-04-05 DIAGNOSIS — I2510 Atherosclerotic heart disease of native coronary artery without angina pectoris: Secondary | ICD-10-CM | POA: Diagnosis not present

## 2021-04-05 DIAGNOSIS — E781 Pure hyperglyceridemia: Secondary | ICD-10-CM | POA: Diagnosis not present

## 2021-04-05 DIAGNOSIS — J439 Emphysema, unspecified: Secondary | ICD-10-CM | POA: Diagnosis not present

## 2021-04-05 DIAGNOSIS — E7800 Pure hypercholesterolemia, unspecified: Secondary | ICD-10-CM | POA: Diagnosis not present

## 2021-04-24 ENCOUNTER — Other Ambulatory Visit: Payer: Self-pay | Admitting: Family Medicine

## 2021-04-24 DIAGNOSIS — Z1231 Encounter for screening mammogram for malignant neoplasm of breast: Secondary | ICD-10-CM

## 2021-04-26 ENCOUNTER — Other Ambulatory Visit: Payer: Self-pay

## 2021-04-26 ENCOUNTER — Ambulatory Visit
Admission: RE | Admit: 2021-04-26 | Discharge: 2021-04-26 | Disposition: A | Discharge status: Home / Self Care | Payer: Medicare Other | Source: Ambulatory Visit | Attending: Family Medicine | Admitting: Family Medicine

## 2021-04-26 DIAGNOSIS — Z1231 Encounter for screening mammogram for malignant neoplasm of breast: Secondary | ICD-10-CM | POA: Diagnosis not present

## 2021-06-01 DIAGNOSIS — E781 Pure hyperglyceridemia: Secondary | ICD-10-CM | POA: Diagnosis not present

## 2021-06-01 DIAGNOSIS — E7800 Pure hypercholesterolemia, unspecified: Secondary | ICD-10-CM | POA: Diagnosis not present

## 2021-06-01 DIAGNOSIS — I10 Essential (primary) hypertension: Secondary | ICD-10-CM | POA: Diagnosis not present

## 2021-06-01 DIAGNOSIS — J439 Emphysema, unspecified: Secondary | ICD-10-CM | POA: Diagnosis not present

## 2021-06-01 DIAGNOSIS — I2510 Atherosclerotic heart disease of native coronary artery without angina pectoris: Secondary | ICD-10-CM | POA: Diagnosis not present

## 2021-08-02 DIAGNOSIS — H35371 Puckering of macula, right eye: Secondary | ICD-10-CM | POA: Diagnosis not present

## 2021-08-09 ENCOUNTER — Other Ambulatory Visit (HOSPITAL_COMMUNITY): Payer: Self-pay | Admitting: Cardiovascular Disease

## 2021-08-09 DIAGNOSIS — R0989 Other specified symptoms and signs involving the circulatory and respiratory systems: Secondary | ICD-10-CM

## 2021-08-21 ENCOUNTER — Ambulatory Visit (HOSPITAL_COMMUNITY)
Admission: RE | Admit: 2021-08-21 | Discharge: 2021-08-21 | Disposition: A | Discharge status: Home / Self Care | Payer: Medicare Other | Source: Ambulatory Visit | Attending: Cardiology | Admitting: Cardiology

## 2021-08-21 ENCOUNTER — Ambulatory Visit (HOSPITAL_BASED_OUTPATIENT_CLINIC_OR_DEPARTMENT_OTHER): Payer: Medicare Other

## 2021-08-21 ENCOUNTER — Other Ambulatory Visit: Payer: Self-pay

## 2021-08-21 DIAGNOSIS — Z87891 Personal history of nicotine dependence: Secondary | ICD-10-CM | POA: Insufficient documentation

## 2021-08-21 DIAGNOSIS — I480 Paroxysmal atrial fibrillation: Secondary | ICD-10-CM | POA: Insufficient documentation

## 2021-08-21 DIAGNOSIS — I10 Essential (primary) hypertension: Secondary | ICD-10-CM | POA: Diagnosis not present

## 2021-08-21 DIAGNOSIS — R0989 Other specified symptoms and signs involving the circulatory and respiratory systems: Secondary | ICD-10-CM | POA: Diagnosis not present

## 2021-08-21 DIAGNOSIS — I350 Nonrheumatic aortic (valve) stenosis: Secondary | ICD-10-CM

## 2021-08-21 DIAGNOSIS — I779 Disorder of arteries and arterioles, unspecified: Secondary | ICD-10-CM | POA: Diagnosis not present

## 2021-08-21 DIAGNOSIS — E785 Hyperlipidemia, unspecified: Secondary | ICD-10-CM | POA: Insufficient documentation

## 2021-08-21 LAB — ECHOCARDIOGRAM COMPLETE
AR max vel: 4.63 cm2
AV Area VTI: 4.83 cm2
AV Area mean vel: 5.05 cm2
AV Mean grad: 10 mmHg
AV Peak grad: 20.3 mmHg
Ao pk vel: 2.25 m/s
Area-P 1/2: 2.39 cm2
S' Lateral: 2.8 cm

## 2021-08-23 ENCOUNTER — Other Ambulatory Visit: Payer: Self-pay

## 2021-08-23 ENCOUNTER — Encounter: Payer: Self-pay | Admitting: Cardiovascular Disease

## 2021-08-23 ENCOUNTER — Ambulatory Visit: Payer: Medicare Other | Admitting: Cardiovascular Disease

## 2021-08-23 VITALS — BP 120/70 | HR 61 | Ht 60.0 in | Wt 148.0 lb

## 2021-08-23 DIAGNOSIS — I10 Essential (primary) hypertension: Secondary | ICD-10-CM

## 2021-08-23 DIAGNOSIS — I480 Paroxysmal atrial fibrillation: Secondary | ICD-10-CM

## 2021-08-23 DIAGNOSIS — I350 Nonrheumatic aortic (valve) stenosis: Secondary | ICD-10-CM | POA: Diagnosis not present

## 2021-08-23 NOTE — Patient Instructions (Signed)
Medication Instructions:  Your physician recommends that you continue on your current medications as directed. Please refer to the Current Medication list given to you today.  *If you need a refill on your cardiac medications before your next appointment, please call your pharmacy*  Lab Work: If you have labs (blood work) drawn today and your tests are completely normal, you will receive your results only by: [place] (if you have MyChart) OR A paper copy in the mail If you have any lab test that is abnormal or we need to change your treatment, we will call you to review the results.  Follow-Up: At [facility], you and your health needs are our priority.  As part of our continuing mission to provide you with exceptional heart care, we have created designated Provider Care Teams.  These Care Teams include your primary Cardiologist (physician) and Advanced Practice Providers (APPs -  Physician Assistants and Nurse Practitioners) who all work together to provide you with the care you need, when you need it.  We recommend signing up for the patient portal called "MyChart".  Sign up information is provided on this After Visit Summary.  MyChart is used to connect with patients for Virtual Visits (Telemedicine).  Patients are able to view lab/test results, encounter notes, upcoming appointments, etc.  Non-urgent messages can be sent to your provider as well.   To learn more about what you can do with MyChart, go to [email or web address].    Your next appointment:   1 year(s)  The format for your next appointment:   In Person  Provider:   Sherren Mocha, MD

## 2021-08-23 NOTE — Progress Notes (Signed)
Cardiology Office Note:    Date:  08/23/2021   ID:  Verba Ainley, DOB 15-Jul-1946, MRN [id number]  PCP:  Kathyrn Lass, MD   [facility] Providers Cardiologist:  Sherren Mocha, MD     Referring MD: Kathyrn Lass, MD   Chief Complaint  Patient presents with   Atrial Fibrillation     History of Present Illness:    Sheila Bruce is a 75 y.o. female with a hx of paroxysmal atrial fibrillation, aortic sclerosis, hypertension, and mixed hyperlipidemia.  The patient is on chronic oral anticoagulation with apixaban and she denies any bleeding or bruising problems.  She is doing quite well and has had no recent problems with heart palpitations, chest pain, chest pressure, or shortness of breath.  The patient feels well and has no cardiac-related complaints today.  Past Medical History:  Diagnosis Date   Anxiety    Habitual alcohol use    Heart murmur    Hx of cardiovascular stress test    a. ETT-Myoview 5/14:  Normal; EF 79%, no ischemia, exercised 10:00.   Hyperlipidemia    Hypertension    PAF (paroxysmal atrial fibrillation) ([place])    a. Echo 4/14: Mild concentric LVH, EF 09-62%, grade 1 diastolic dysfunction, trivial MR, PASP 29;  b. Eliquis 5 bid   Vertigo     Past Surgical History:  Procedure Laterality Date   ABDOMINAL HYSTERECTOMY     BREAST EXCISIONAL BIOPSY     CYSTOSCOPY     Laproscopy-Abdominal     TENDON REPAIR     Left hand    Current Medications: Current Meds  Medication Sig   apixaban (ELIQUIS) 5 MG TABS tablet Take 1 tablet (5 mg total) by mouth 2 (two) times daily.   atorvastatin (LIPITOR) 20 MG tablet Take 1 tablet (20 mg total) by mouth daily at 6 PM.   Cholecalciferol (VITAMIN D3) 50 MCG (2000 UT) CAPS Take 1 capsule by mouth daily.   diltiazem (CARDIZEM CD) 240 MG 24 hr capsule Take 240 mg by mouth daily.   fluticasone (FLONASE) 50 MCG/ACT nasal spray Place 2 sprays into the nose daily.   hydrochlorothiazide (HYDRODIURIL) 25 MG tablet Take 25  mg by mouth every morning.   ibuprofen (ADVIL) 200 MG tablet Take 200 mg by mouth as needed for pain.   loratadine (CLARITIN) 10 MG tablet Take 10 mg by mouth daily.   losartan (COZAAR) 100 MG tablet Take 100 mg by mouth daily.     Allergies:   Ciprocinonide [fluocinolone], Codeine, Ciprofloxacin, and Doxycycline   Social History   Socioeconomic History   Marital status: Married    Spouse name: Not on file   Number of children: Not on file   Years of education: Not on file   Highest education level: Not on file  Occupational History   Not on file  Tobacco Use   Smoking status: Former    Types: Cigarettes    Quit date: 01/12/2005    Years since quitting: 16.6   Smokeless tobacco: Never   Tobacco comments:    Smoked for 35 yrs - As of 08/2015  chews nicotine gum  Substance and Sexual Activity   Alcohol use: Yes    Alcohol/week: 1.0 standard drink    Types: 1 Glasses of wine per week    Comment: Occasional use now - less than one per day during week, Occasional 2-3 on the weekend.   Drug use: No   Sexual activity: Never  Other Topics Concern  Not on file  Social History Narrative   Not on file   Social Determinants of Health   Financial Resource Strain: Not on file  Food Insecurity: Not on file  Transportation Needs: Not on file  Physical Activity: Not on file  Stress: Not on file  Social Connections: Not on file     Family History: The patient's family history includes Autoimmune disease in an other family member; Breast cancer in her maternal grandmother and paternal grandmother; CAD in some other family members; High Cholesterol in her father; Hyperlipidemia in an other family member; Hypertension in an other family member; Peripheral vascular disease in her mother.  ROS:   Please see the history of present illness.    All other systems reviewed and are negative.  EKGs/Labs/Other Studies Reviewed:    The following studies were reviewed today: Echo  08/21/2021:  1. Left ventricular ejection fraction, by estimation, is 60 to 65%. The  left ventricle has normal function. The left ventricle has no regional  wall motion abnormalities. There is mild left ventricular hypertrophy.  Left ventricular diastolic parameters  are consistent with Grade I diastolic dysfunction (impaired relaxation).   2. Right ventricular systolic function is normal. The right ventricular  size is normal. There is normal pulmonary artery systolic pressure. The  estimated right ventricular systolic pressure is 45.4 mmHg.   3. The mitral valve is abnormal. Trivial mitral valve regurgitation.   4. The aortic valve was not well visualized. Aortic valve regurgitation  is not visualized. No aortic stenosis is present. Aortic valve mean  gradient measures 10.0 mmHg.   5. The inferior vena cava is normal in size with greater than 50%  respiratory variability, suggesting right atrial pressure of 3 mmHg.   Comparison(s): Prior images unable to be directly viewed, comparison made  by report only. Changes from prior study are noted. 05/31/2017: LVEF  09-81%, normal diastolic function.   Carotid ultrasound 08/21/2021: Summary:  Right Carotid: Velocities in the right ICA are consistent with a 1-39%  stenosis.                 Non-hemodynamically significant plaque <50% noted in the  CCA.   Left Carotid: Velocities in the left ICA are consistent with a 1-39%  stenosis.                Non-hemodynamically significant plaque <50% noted in the  CCA.   Vertebrals:  Bilateral vertebral arteries demonstrate antegrade flow.  Subclavians: Normal flow hemodynamics were seen in bilateral subclavian               arteries.   EKG:  EKG is ordered today.  The ekg ordered today demonstrates normal sinus rhythm 61 bpm, within normal limits.  Recent Labs: No results found for requested labs within last 8760 hours.  Recent Lipid Panel    Component Value Date/Time   CHOL  04/01/2010  0445    172        ATP III CLASSIFICATION:  <200     mg/dL   Desirable  200-239  mg/dL   Borderline High  >=240    mg/dL   High          TRIG 116 04/01/2010 0445   HDL 68 04/01/2010 0445   CHOLHDL 2.5 04/01/2010 0445   VLDL 23 04/01/2010 0445   LDLCALC  04/01/2010 0445    81        Total Cholesterol/HDL:CHD Risk Coronary Heart Disease Risk Table  Men   Women  1/2 Average Risk   3.4   3.3  Average Risk       5.0   4.4  2 X Average Risk   9.6   7.1  3 X Average Risk  23.4   11.0        Use the calculated Patient Ratio above and the CHD Risk Table to determine the patient's CHD Risk.        ATP III CLASSIFICATION (LDL):  <100     mg/dL   Optimal  100-129  mg/dL   Near or Above                    Optimal  130-159  mg/dL   Borderline  160-189  mg/dL   High  >190     mg/dL   Very High     Risk Assessment/Calculations:    CHA2DS2-VASc Score = 3   This indicates a 3.2% annual risk of stroke. The patient's score is based upon: CHF History: 0 HTN History: 1 Diabetes History: 0 Stroke History: 0 Vascular Disease History: 0 Age Score: 1 Gender Score: 1         Physical Exam:    VS:  BP 120/70   Pulse 61   Ht 5' (1.524 m)   Wt 148 lb (67.1 kg)   SpO2 96%   BMI 28.90 kg/m     Wt Readings from Last 3 Encounters:  08/23/21 148 lb (67.1 kg)  08/01/20 150 lb 12.8 oz (68.4 kg)  07/29/19 150 lb (68 kg)     GEN:  Well nourished, well developed in no acute distress HEENT: Normal NECK: No JVD; No carotid bruits LYMPHATICS: No lymphadenopathy CARDIAC: RRR, 2/6 SEM at the RUSB RESPIRATORY:  Clear to auscultation without rales, wheezing or rhonchi  ABDOMEN: Soft, non-tender, non-distended MUSCULOSKELETAL:  No edema; No deformity  SKIN: Warm and dry NEUROLOGIC:  Alert and oriented x 3 PSYCHIATRIC:  Normal affect   ASSESSMENT:    1. PAF (paroxysmal atrial fibrillation) ([place])   2. Essential hypertension   3. Nonrheumatic aortic valve stenosis     PLAN:    In order of problems listed above:  The patient's atrial fibrillation is well controlled.  She is tolerating apixaban without bleeding problems.  I will see her back in 1 year. Blood pressure is well controlled on diltiazem, hydrochlorothiazide, and losartan.  Most recent labs are reviewed with a creatinine of 1.2, potassium 4.8. The patient has very mild aortic stenosis with a mean transvalvular gradient of 10 mmHg.  We will continue to follow clinically and consider repeat echo imaging in 3 to 4 years.           Medication Adjustments/Labs and Tests Ordered: Current medicines are reviewed at length with the patient today.  Concerns regarding medicines are outlined above.  Orders Placed This Encounter  Procedures   EKG 12-Lead    No orders of the defined types were placed in this encounter.   Patient Instructions   Medication Instructions:  Your physician recommends that you continue on your current medications as directed. Please refer to the Current Medication list given to you today.  *If you need a refill on your cardiac medications before your next appointment, please call your pharmacy*  Lab Work: If you have labs (blood work) drawn today and your tests are completely normal, you will receive your results only by: [place] (if you have MyChart) OR A paper copy in  the mail If you have any lab test that is abnormal or we need to change your treatment, we will call you to review the results.  Follow-Up: At [facility], you and your health needs are our priority.  As part of our continuing mission to provide you with exceptional heart care, we have created designated Provider Care Teams.  These Care Teams include your primary Cardiologist (physician) and Advanced Practice Providers (APPs -  Physician Assistants and Nurse Practitioners) who all work together to provide you with the care you need, when you need it.  We recommend signing up for the patient  portal called "MyChart".  Sign up information is provided on this After Visit Summary.  MyChart is used to connect with patients for Virtual Visits (Telemedicine).  Patients are able to view lab/test results, encounter notes, upcoming appointments, etc.  Non-urgent messages can be sent to your provider as well.   To learn more about what you can do with MyChart, go to [email or web address].    Your next appointment:   1 year(s)  The format for your next appointment:   In Person  Provider:   Sherren Mocha, MD     Signed, Sherren Mocha, MD  08/23/2021 5:52 PM    [place]

## 2021-09-06 DIAGNOSIS — D6869 Other thrombophilia: Secondary | ICD-10-CM | POA: Diagnosis not present

## 2021-09-06 DIAGNOSIS — E7800 Pure hypercholesterolemia, unspecified: Secondary | ICD-10-CM | POA: Diagnosis not present

## 2021-09-06 DIAGNOSIS — N1831 Chronic kidney disease, stage 3a: Secondary | ICD-10-CM | POA: Diagnosis not present

## 2021-09-06 DIAGNOSIS — R7303 Prediabetes: Secondary | ICD-10-CM | POA: Diagnosis not present

## 2021-09-06 DIAGNOSIS — I129 Hypertensive chronic kidney disease with stage 1 through stage 4 chronic kidney disease, or unspecified chronic kidney disease: Secondary | ICD-10-CM | POA: Diagnosis not present

## 2021-09-06 DIAGNOSIS — I4819 Other persistent atrial fibrillation: Secondary | ICD-10-CM | POA: Diagnosis not present

## 2021-09-06 DIAGNOSIS — Z87891 Personal history of nicotine dependence: Secondary | ICD-10-CM | POA: Diagnosis not present

## 2021-10-05 DIAGNOSIS — I129 Hypertensive chronic kidney disease with stage 1 through stage 4 chronic kidney disease, or unspecified chronic kidney disease: Secondary | ICD-10-CM | POA: Diagnosis not present

## 2021-10-05 DIAGNOSIS — N1831 Chronic kidney disease, stage 3a: Secondary | ICD-10-CM | POA: Diagnosis not present

## 2021-10-05 DIAGNOSIS — I10 Essential (primary) hypertension: Secondary | ICD-10-CM | POA: Diagnosis not present

## 2021-10-05 DIAGNOSIS — E7800 Pure hypercholesterolemia, unspecified: Secondary | ICD-10-CM | POA: Diagnosis not present

## 2021-10-05 DIAGNOSIS — I2510 Atherosclerotic heart disease of native coronary artery without angina pectoris: Secondary | ICD-10-CM | POA: Diagnosis not present

## 2021-10-05 DIAGNOSIS — J439 Emphysema, unspecified: Secondary | ICD-10-CM | POA: Diagnosis not present

## 2021-10-26 ENCOUNTER — Emergency Department (HOSPITAL_COMMUNITY)
Admission: EM | Admit: 2021-10-26 | Discharge: 2021-10-27 | Disposition: A | Discharge status: Home / Self Care | Payer: Medicare Other | Attending: Emergency Medicine | Admitting: Emergency Medicine

## 2021-10-26 DIAGNOSIS — R404 Transient alteration of awareness: Secondary | ICD-10-CM | POA: Diagnosis not present

## 2021-10-26 DIAGNOSIS — R0602 Shortness of breath: Secondary | ICD-10-CM | POA: Diagnosis not present

## 2021-10-26 DIAGNOSIS — J9811 Atelectasis: Secondary | ICD-10-CM | POA: Diagnosis not present

## 2021-10-26 DIAGNOSIS — R0789 Other chest pain: Secondary | ICD-10-CM | POA: Insufficient documentation

## 2021-10-26 DIAGNOSIS — Z7901 Long term (current) use of anticoagulants: Secondary | ICD-10-CM | POA: Diagnosis not present

## 2021-10-26 DIAGNOSIS — Z743 Need for continuous supervision: Secondary | ICD-10-CM | POA: Diagnosis not present

## 2021-10-26 DIAGNOSIS — Z79899 Other long term (current) drug therapy: Secondary | ICD-10-CM | POA: Diagnosis not present

## 2021-10-26 DIAGNOSIS — I700 Atherosclerosis of aorta: Secondary | ICD-10-CM | POA: Diagnosis not present

## 2021-10-26 DIAGNOSIS — R61 Generalized hyperhidrosis: Secondary | ICD-10-CM | POA: Diagnosis not present

## 2021-10-26 DIAGNOSIS — R059 Cough, unspecified: Secondary | ICD-10-CM | POA: Insufficient documentation

## 2021-10-26 DIAGNOSIS — R079 Chest pain, unspecified: Secondary | ICD-10-CM

## 2021-10-26 DIAGNOSIS — R1013 Epigastric pain: Secondary | ICD-10-CM | POA: Insufficient documentation

## 2021-10-26 DIAGNOSIS — R6889 Other general symptoms and signs: Secondary | ICD-10-CM | POA: Diagnosis not present

## 2021-10-26 NOTE — ED Provider Triage Note (Signed)
Emergency Medicine Provider Triage Evaluation Note  Sheila Bruce , a 76 y.o. female  was evaluated in triage.  Pt complains of chest pressure intermittently throughout the day today-- total of 3 episodes lasting <1 hour each.  Center of chest, non-radiating.  Little SOB with it but not severe.  Hx of GERD but states it feels different.  Review of Systems  Positive: Chest pain Negative: Cough, fever  Physical Exam  BP (!) 135/93    Pulse 83    Temp 98.5 F (36.9 C) (Oral)    Resp 20    SpO2 97%   Gen:   Awake, no distress   Resp:  Normal effort  MSK:   Moves extremities without difficulty  Other:    Medical Decision Making  Medically screening exam initiated at 11:34 PM.  Appropriate orders placed.  Sheila Bruce was informed that the remainder of the evaluation will be completed by another provider, this initial triage assessment does not replace that evaluation, and the importance of remaining in the ED until their evaluation is complete.  Chest pain.     Sheila Pickett, PA-C 10/27/21 [phone]

## 2021-10-27 ENCOUNTER — Emergency Department (HOSPITAL_COMMUNITY): Payer: Medicare Other

## 2021-10-27 ENCOUNTER — Encounter (HOSPITAL_COMMUNITY): Payer: Self-pay

## 2021-10-27 ENCOUNTER — Other Ambulatory Visit: Payer: Self-pay

## 2021-10-27 DIAGNOSIS — I700 Atherosclerosis of aorta: Secondary | ICD-10-CM | POA: Diagnosis not present

## 2021-10-27 DIAGNOSIS — R079 Chest pain, unspecified: Secondary | ICD-10-CM | POA: Diagnosis not present

## 2021-10-27 DIAGNOSIS — J9811 Atelectasis: Secondary | ICD-10-CM | POA: Diagnosis not present

## 2021-10-27 LAB — CBC WITH DIFFERENTIAL/PLATELET
Abs Immature Granulocytes: 0.05 10*3/uL (ref 0.00–0.07)
Basophils Absolute: 0 10*3/uL (ref 0.0–0.1)
Basophils Relative: 0 %
Eosinophils Absolute: 0.2 10*3/uL (ref 0.0–0.5)
Eosinophils Relative: 2 %
HCT: 39.8 % (ref 36.0–46.0)
Hemoglobin: 12.9 g/dL (ref 12.0–15.0)
Immature Granulocytes: 1 %
Lymphocytes Relative: 14 %
Lymphs Abs: 1.6 10*3/uL (ref 0.7–4.0)
MCH: 31 pg (ref 26.0–34.0)
MCHC: 32.4 g/dL (ref 30.0–36.0)
MCV: 95.7 fL (ref 80.0–100.0)
Monocytes Absolute: 0.8 10*3/uL (ref 0.1–1.0)
Monocytes Relative: 7 %
Neutro Abs: 8.3 10*3/uL — ABNORMAL HIGH (ref 1.7–7.7)
Neutrophils Relative %: 76 %
Platelets: 240 10*3/uL (ref 150–400)
RBC: 4.16 MIL/uL (ref 3.87–5.11)
RDW: 14.5 % (ref 11.5–15.5)
WBC: 10.9 10*3/uL — ABNORMAL HIGH (ref 4.0–10.5)
nRBC: 0 % (ref 0.0–0.2)

## 2021-10-27 LAB — COMPREHENSIVE METABOLIC PANEL
ALT: 42 U/L (ref 0–44)
AST: 33 U/L (ref 15–41)
Albumin: 4 g/dL (ref 3.5–5.0)
Alkaline Phosphatase: 81 U/L (ref 38–126)
Anion gap: 8 (ref 5–15)
BUN: 31 mg/dL — ABNORMAL HIGH (ref 8–23)
CO2: 26 mmol/L (ref 22–32)
Calcium: 9.3 mg/dL (ref 8.9–10.3)
Chloride: 103 mmol/L (ref 98–111)
Creatinine, Ser: 1.17 mg/dL — ABNORMAL HIGH (ref 0.44–1.00)
GFR, Estimated: 49 mL/min — ABNORMAL LOW (ref >60)
Glucose, Bld: 124 mg/dL — ABNORMAL HIGH (ref 70–99)
Potassium: 3.3 mmol/L — ABNORMAL LOW (ref 3.5–5.1)
Sodium: 137 mmol/L (ref 135–145)
Total Bilirubin: 1.4 mg/dL — ABNORMAL HIGH (ref 0.3–1.2)
Total Protein: 7.1 g/dL (ref 6.5–8.1)

## 2021-10-27 LAB — BRAIN NATRIURETIC PEPTIDE: B Natriuretic Peptide: 18.6 pg/mL (ref 0.0–100.0)

## 2021-10-27 LAB — TROPONIN I (HIGH SENSITIVITY)
Troponin I (High Sensitivity): 6 ng/L (ref <18)
Troponin I (High Sensitivity): 7 ng/L (ref <18)

## 2021-10-27 NOTE — ED Provider Notes (Signed)
[facility] EMERGENCY DEPARTMENT Provider Note   CSN: [id number] Arrival date & time: 10/26/21  2334     History  Chief Complaint  Sheila Bruce presents with   Chest Pain    Sheila Bruce is a Sheila y.o. female.  Sheila Bruce with a chief complaints of chest pain.  Sheila Bruce had 3 episodes of it last night.  The first 1 was shortly after eating and then had 2 more.  The last one Sheila Bruce had was about 10 PM and Sheila Bruce felt a bit sweaty and warm and so decided to call 911.  Each time it lasted for about 20 minutes and then resolved.  Sheila Bruce has had a very mild cough.  Sheila Bruce denies fevers or chills.  Denies trauma to the chest.  Denies exertional symptoms.  Sheila Bruce has a remote history of reflux but thinks that this feels somewhat different.  Denies abdominal pain.  Sheila Bruce denies history of MI, denies diabetes or smoking.  Sheila Bruce has a history of hypertension hyperlipidemia in Sheila Bruce father had an MI in his 22s  Sheila Bruce denies history of PE or DVT denies hemoptysis denies unilateral lower extremity edema denies recent surgery immobilization hospitalization estrogen use or history of cancer.      The history is provided by the Sheila Bruce.  Chest Pain Pain location:  Epigastric Pain quality: aching   Pain radiates to:  Does not radiate Pain severity:  Moderate Onset quality:  Gradual Duration:  20 minutes Timing:  Intermittent Progression:  Waxing and waning Chronicity:  New Relieved by:  Nothing Worsened by:  Nothing Ineffective treatments:  None tried Associated symptoms: diaphoresis   Associated symptoms: no dizziness, no fever, no headache, no nausea, no palpitations, no shortness of breath and no vomiting       Home Medications Prior to Admission medications   Medication Sig Start Date End Date Taking? Authorizing Provider  apixaban (ELIQUIS) 5 MG TABS tablet Take 1 tablet (5 mg total) by mouth 2 (two) times daily. 01/13/13   Hosie Poisson, MD  atorvastatin (LIPITOR) 20 MG tablet Take 1  tablet (20 mg total) by mouth daily at 6 PM. 01/13/13   Hosie Poisson, MD  Cholecalciferol (VITAMIN D3) 50 MCG (2000 UT) CAPS Take 1 capsule by mouth daily.    [provider]  conjugated estrogens (PREMARIN) vaginal cream Place 1 Applicatorful vaginally as directed.  Sheila Bruce not taking: Reported on 08/23/2021    [provider]  diltiazem (CARDIZEM CD) 240 MG 24 hr capsule Take 240 mg by mouth daily. 03/09/17   [provider]  fluticasone (FLONASE) 50 MCG/ACT nasal spray Place 2 sprays into the nose daily.    [provider]  hydrochlorothiazide (HYDRODIURIL) 25 MG tablet Take 25 mg by mouth every morning. 06/05/19   [provider]  ibuprofen (ADVIL) 200 MG tablet Take 200 mg by mouth as needed for pain.    [provider]  loratadine (CLARITIN) 10 MG tablet Take 10 mg by mouth daily.    [provider]  losartan (COZAAR) 100 MG tablet Take 100 mg by mouth daily.    [provider]  Multiple Vitamin (MULTIVITAMIN) tablet Take 1 tablet by mouth daily. Sheila Bruce not taking: Reported on 08/23/2021    [provider]      Allergies    Ciprocinonide [fluocinolone], Codeine, Ciprofloxacin, and Doxycycline    Review of Systems   Review of Systems  Constitutional:  Positive for diaphoresis. Negative for chills and fever.  HENT:  Negative for congestion and rhinorrhea.   Eyes:  Negative for redness and visual disturbance.  Respiratory:  Negative for shortness of breath and wheezing.   Cardiovascular:  Positive for chest pain. Negative for palpitations.  Gastrointestinal:  Negative for nausea and vomiting.  Genitourinary:  Negative for dysuria and urgency.  Musculoskeletal:  Negative for arthralgias and myalgias.  Skin:  Negative for pallor and wound.  Neurological:  Negative for dizziness and headaches.   Physical Exam Updated Vital Signs BP 134/76 (BP Location: Right Arm)    Pulse 65    Temp 98.1 Bruce (36.7 C) (Oral)     Resp 16    Ht 5' (1.524 m)    Wt 64.4 kg    SpO2 99%    BMI 27.73 kg/m  Physical Exam Vitals and nursing note reviewed.  Constitutional:      General: Sheila Bruce is not in acute distress.    Appearance: Sheila Bruce is well-developed. Sheila Bruce is not diaphoretic.  HENT:     Head: Normocephalic and atraumatic.  Eyes:     Pupils: Pupils are equal, round, and reactive to light.  Cardiovascular:     Rate and Rhythm: Normal rate and regular rhythm.     Heart sounds: No murmur heard.   No friction rub. No gallop.  Pulmonary:     Effort: Pulmonary effort is normal.     Breath sounds: No wheezing or rales.  Chest:     Chest wall: No tenderness.  Abdominal:     General: There is no distension.     Palpations: Abdomen is soft.     Tenderness: There is no abdominal tenderness.  Musculoskeletal:        General: No tenderness.     Cervical back: Normal range of motion and neck supple.  Skin:    General: Skin is warm and dry.  Neurological:     Mental Status: Sheila Bruce is alert and oriented to person, place, and time.  Psychiatric:        Behavior: Behavior normal.    ED Results / Procedures / Treatments   Labs (all labs ordered are listed, but only abnormal results are displayed) Labs Reviewed  CBC WITH DIFFERENTIAL/PLATELET - Abnormal; Notable for the following components:      Result Value   WBC 10.9 (*)    Neutro Abs 8.3 (*)    All other components within normal limits  COMPREHENSIVE METABOLIC PANEL - Abnormal; Notable for the following components:   Potassium 3.3 (*)    Glucose, Bld 124 (*)    BUN 31 (*)    Creatinine, Ser 1.17 (*)    Total Bilirubin 1.4 (*)    GFR, Estimated 49 (*)    All other components within normal limits  BRAIN NATRIURETIC PEPTIDE  TROPONIN I (HIGH SENSITIVITY)  TROPONIN I (HIGH SENSITIVITY)    EKG EKG Interpretation  Date/Time:  Thursday October 26 2021 23:50:39 EST Ventricular Rate:  77 PR Interval:  164 QRS Duration: 78 QT Interval:  400 QTC  Calculation: 452 R Axis:   37 Text Interpretation: Normal sinus rhythm Normal ECG When compared with ECG of 18-Jun-2015 21:31, PREVIOUS ECG IS PRESENT slight st depression diffusely on last ecg 2016 now resolved Confirmed by Deno Etienne [phone]) on 10/27/2021 9:09:48 AM  Radiology DG Chest 2 View  Result Date: 10/27/2021 CLINICAL DATA:  Chest pain. EXAM: CHEST - 2 VIEW COMPARISON:  None. FINDINGS: The heart size and mediastinal contours are within normal limits. There is mild to moderate severity  calcification of the aortic arch. Very mild linear atelectasis is seen within the lateral aspect of the left lung base. There is no evidence of an acute infiltrate, pleural effusion or pneumothorax. The visualized skeletal structures are unremarkable. IMPRESSION: Very mild left basilar linear atelectasis. Electronically Signed   By: Virgina Norfolk M.D.   On: 10/27/2021 00:46    Procedures Procedures    Medications Ordered in ED Medications - No data to display  ED Course/ Medical Decision Making/ A&P                           Medical Decision Making  Sheila Bruce with a chief complaint of chest pain.  This is atypical in nature lasting 20 minutes at a time positional and initially occurred after eating.  Sheila Bruce has 2 troponins that are negative EKG without concerning finding Sheila Bruce has a very mild leukocytosis of unknown significance chest x-ray viewed by me without focal infiltrate or pneumothorax.  Sheila Bruce had a BNP that was normal.  By history I find this to be very atypical of ACS and even though the Sheila Bruce has multiple risk factors I do not feel Sheila Bruce would benefit from a hospitalization.  I suggested Sheila Bruce follow-up with Sheila Bruce PCP, Sheila Bruce also follows up with cardiology for atrial fibrillation on chart review.  Sees Dr. Burt Knack.  9:41 AM:  I have discussed the diagnosis/risks/treatment options with the Sheila Bruce and family and believe the Sheila Bruce to be eligible for discharge home to follow-up with PCP. We also discussed  returning to the ED immediately if new or worsening sx occur. We discussed the sx which are most concerning (e.g., sudden worsening pain, fever, inability to tolerate by mouth) that necessitate immediate return. Medications administered to the Sheila Bruce during their visit and any new prescriptions provided to the Sheila Bruce are listed below.  Medications given during this visit Medications - No data to display   The Sheila Bruce appears reasonably screen and/or stabilized for discharge and I doubt any other medical condition or other [facility] requiring further screening, evaluation, or treatment in the ED at this time prior to discharge.          Final Clinical Impression(s) / ED Diagnoses Final diagnoses:  Nonspecific chest pain    Rx / DC Orders ED Discharge Orders     None         Deno Etienne, DO 10/27/21 [phone]

## 2021-10-27 NOTE — ED Triage Notes (Signed)
Pt presents to the ED from home via [facility] with complaints of chest pressure x3 episodes. 1st episode was at 1330 and lasted 1 hour, 2nd episode was at 2000 and lasted 1 hour, 3rd episode was 2200 and pt called EMS. Pt denies any chest pressure now. Pt had headache and SOB during episodes but has no complaints presently.

## 2021-10-27 NOTE — Discharge Instructions (Signed)
Try pepcid or tagamet up to twice a day.  Try to avoid things that may make this worse, most commonly these are spicy foods tomato based products fatty foods chocolate and peppermint.  Alcohol and tobacco can also make this worse.  Return to the emergency department for sudden worsening pain fever or inability to eat or drink.  

## 2021-10-30 DIAGNOSIS — E7800 Pure hypercholesterolemia, unspecified: Secondary | ICD-10-CM | POA: Diagnosis not present

## 2021-10-30 DIAGNOSIS — I10 Essential (primary) hypertension: Secondary | ICD-10-CM | POA: Diagnosis not present

## 2021-10-30 DIAGNOSIS — J439 Emphysema, unspecified: Secondary | ICD-10-CM | POA: Diagnosis not present

## 2021-10-30 DIAGNOSIS — N1831 Chronic kidney disease, stage 3a: Secondary | ICD-10-CM | POA: Diagnosis not present

## 2021-10-30 DIAGNOSIS — I2510 Atherosclerotic heart disease of native coronary artery without angina pectoris: Secondary | ICD-10-CM | POA: Diagnosis not present

## 2021-10-30 DIAGNOSIS — I129 Hypertensive chronic kidney disease with stage 1 through stage 4 chronic kidney disease, or unspecified chronic kidney disease: Secondary | ICD-10-CM | POA: Diagnosis not present

## 2021-11-07 DIAGNOSIS — R079 Chest pain, unspecified: Secondary | ICD-10-CM | POA: Diagnosis not present

## 2021-11-10 ENCOUNTER — Telehealth: Payer: Self-pay | Admitting: Cardiovascular Disease

## 2021-11-10 NOTE — Telephone Encounter (Signed)
Spoke with the patient and have scheduled her for a follow up visit with Estella Husk. Patient will let us know if she has any recurrent chest pain.

## 2021-11-10 NOTE — Telephone Encounter (Signed)
Patient's PCP office called.  The patient was seen by the PCP as a follow up from her ER visit. The PCP noted there was no recent coronary artery evaluation post Echo. The PCP wanted to know what the next step would be in regards to the patient's care.  The PCP also wanted to know if the patient needs to follow up with Cardiology post ER visit. The PCP can reach out to the patient if needed to arrange follow up visit.

## 2021-11-10 NOTE — Telephone Encounter (Signed)
Spoke with a nurse at Dr. Ammie Ferrier office. Dr. Sabra Heck saw the patient after her ER visit for nonspecific chest pain. The patient was doing well when they saw her with no chest pain or other concerns. Dr. Sabra Heck would like to know if the patient should follow up with cardiology and/or have any CAD screening done. Advised that I would make Dr. Burt Knack aware of recent ER visit and we would call patient if he had any further advisement for her.

## 2021-11-10 NOTE — Telephone Encounter (Signed)
Reviewed data from ER.  Reassuring evaluation noted with normal troponins.  If no recurrent symptoms, also okay to arrange a post-ER office visit with me or an APP to determine if further testing needed.  If she has had recurrent chest pain, I would go ahead and do a Lexiscan Myoview stress test prior to the office visit.  Thank you

## 2021-11-21 NOTE — Progress Notes (Signed)
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  Patient saw Dr. Burt Bruce 08/23/21 and doing well. Plan to repeat echo 3-4 yrs.  In ED chest pain 10/26/21 and troponins negative EKG no change. Dr. Burt Bruce reviewed and said to do Lexi if recurrent chest pain.  Patient says it was a little pressure in center of her chest that would come and go for 6 hrs and a little dizzy. Started after lunch. Became anxious and went to ED. No reflux, radiation, dyspnea, palpitations. Exertion didn't make it worse but occurred while reading. No pain since. Plays golf twice a week and walks her dog 30 min daily. Gets out of breath when walking up and down hills but unchanged. Has an occasional skip when anxious. Thinks she has a bladder infection with some hematuria and pain. Has had hemorrhagic cystitis in the past. Taking peridium.    Past Medical History:  Diagnosis Date   Anxiety    Habitual alcohol use    Heart murmur    Hx of cardiovascular stress test    a. ETT-Myoview 5/14:  Normal; EF 79%, no ischemia, exercised 10:00.   Hyperlipidemia    Hypertension    PAF (paroxysmal atrial fibrillation) ([place])    a. Echo 4/14: Mild concentric LVH, EF 28-00%, grade 1 diastolic dysfunction, trivial MR, PASP 29;  b. Eliquis 5 bid   Vertigo     Past Surgical History:  Procedure Laterality Date   ABDOMINAL HYSTERECTOMY     BREAST EXCISIONAL BIOPSY      CYSTOSCOPY     Laproscopy-Abdominal     TENDON REPAIR     Left hand    Current Medications: Current Meds  Medication Sig   apixaban (ELIQUIS) 5 MG TABS tablet Take 1 tablet (5 mg total) by mouth 2 (two) times daily.   atorvastatin (LIPITOR) 20 MG tablet Take 1 tablet (20 mg total) by mouth daily at 6 PM.   Cholecalciferol (VITAMIN D3) 50 MCG (2000 UT) CAPS Take 1 capsule by mouth daily.   conjugated estrogens (PREMARIN) vaginal cream Place 1 Applicatorful vaginally as directed.   diltiazem (CARDIZEM CD) 240 MG 24 hr capsule Take 240 mg by mouth daily.   famotidine (PEPCID) 20 MG tablet 1 tablet at bedtime as needed   fluticasone (FLONASE) 50 MCG/ACT nasal spray Place 2 sprays into the nose daily.   hydrochlorothiazide (HYDRODIURIL) 25 MG tablet Take 25 mg by mouth every morning.   ibuprofen (ADVIL) 200 MG tablet Take 200 mg by mouth as needed for pain.   loratadine (CLARITIN) 10 MG tablet Take 10 mg by mouth daily.   losartan (COZAAR) 100 MG tablet Take 100 mg by mouth daily.   metoprolol tartrate (LOPRESSOR) 50 MG tablet Take 1 tablet 2 hours prior to CT Scan   Multiple Vitamin (MULTIVITAMIN) tablet Take 1 tablet by mouth daily.     Allergies:   Ciprocinonide [fluocinolone], Codeine,  Ciprofloxacin, and Doxycycline   Social History   Socioeconomic History   Marital status: Married    Spouse name: Not on file   Number of children: Not on file   Years of education: Not on file   Highest education level: Not on file  Occupational History   Not on file  Tobacco Use   Smoking status: Former    Types: Cigarettes    Quit date: 01/12/2005    Years since quitting: 16.8   Smokeless tobacco: Never   Tobacco comments:    Smoked for 35 yrs - As of 08/2015  chews nicotine gum  Substance and Sexual Activity   Alcohol use: Not Currently    Alcohol/week: 1.0 standard drink    Types: 1 Glasses of wine per week    Comment: Occasional use now - less than one per day during week, Occasional  2-3 on the weekend.   Drug use: No   Sexual activity: Never  Other Topics Concern   Not on file  Social History Narrative   Not on file   Social Determinants of Health   Financial Resource Strain: Not on file  Food Insecurity: Not on file  Transportation Needs: Not on file  Physical Activity: Not on file  Stress: Not on file  Social Connections: Not on file     Family History:  The patient's  family history includes Autoimmune disease in an other family member; Breast cancer in her maternal grandmother and paternal grandmother; CAD in some other family members; High Cholesterol in her father; Hyperlipidemia in an other family member; Hypertension in an other family member; Peripheral vascular disease in her mother.   ROS:   Please see the history of present illness.    ROS All other systems reviewed and are negative.   PHYSICAL EXAM:   VS:  BP 118/60    Pulse 81    Ht 5' (1.524 m)    Wt 140 lb 9.6 oz (63.8 kg)    SpO2 97%    BMI 27.46 kg/m   Physical Exam  GEN: Well nourished, well developed, in no acute distress  Neck: no JVD, carotid bruits, or masses Cardiac:RRR; 2/6 systolic murmur RSB Respiratory:  decreased breath sound but clear to auscultation bilaterally, normal work of breathing GI: soft, nontender, nondistended, + BS Ext: without cyanosis, clubbing, or edema, Good distal pulses bilaterally Neuro:  Alert and Oriented x 3 Psych: euthymic mood, full affect  Wt Readings from Last 3 Encounters:  11/29/21 140 lb 9.6 oz (63.8 kg)  10/27/21 142 lb (64.4 kg)  08/23/21 148 lb (67.1 kg)      Studies/Labs Reviewed:   EKG:  EKG is  ordered today.  The ekg reviewed from ED NSR normal EKG  Recent Labs: 10/27/2021: ALT 42; B Natriuretic Peptide 18.6; BUN 31; Creatinine, Ser 1.17; Hemoglobin 12.9; Platelets 240; Potassium 3.3; Sodium 137   Lipid Panel    Component Value Date/Time   CHOL  04/01/2010 0445    172        ATP III CLASSIFICATION:  <200     mg/dL    Desirable  200-239  mg/dL   Borderline High  >=240    mg/dL   High          TRIG 116 04/01/2010 0445   HDL 68 04/01/2010 0445   CHOLHDL 2.5 04/01/2010 0445   VLDL 23 04/01/2010 0445   LDLCALC  04/01/2010 0445    81  Total Cholesterol/HDL:CHD Risk Coronary Heart Disease Risk Table                     Men   Women  1/2 Average Risk   3.4   3.3  Average Risk       5.0   4.4  2 X Average Risk   9.6   7.1  3 X Average Risk  23.4   11.0        Use the calculated Patient Ratio above and the CHD Risk Table to determine the patient's CHD Risk.        ATP III CLASSIFICATION (LDL):  <100     mg/dL   Optimal  100-129  mg/dL   Near or Above                    Optimal  130-159  mg/dL   Borderline  160-189  mg/dL   High  >190     mg/dL   Very High    Additional studies/ records that were reviewed today include:   Echo 08/21/2021:  1. Left ventricular ejection fraction, by estimation, is 60 to 65%. The  left ventricle has normal function. The left ventricle has no regional  wall motion abnormalities. There is mild left ventricular hypertrophy.  Left ventricular diastolic parameters  are consistent with Grade I diastolic dysfunction (impaired relaxation).   2. Right ventricular systolic function is normal. The right ventricular  size is normal. There is normal pulmonary artery systolic pressure. The  estimated right ventricular systolic pressure is 16.3 mmHg.   3. The mitral valve is abnormal. Trivial mitral valve regurgitation.   4. The aortic valve was not well visualized. Aortic valve regurgitation  is not visualized. No aortic stenosis is present. Aortic valve mean  gradient measures 10.0 mmHg.   5. The inferior vena cava is normal in size with greater than 50%  respiratory variability, suggesting right atrial pressure of 3 mmHg.   Comparison(s): Prior images unable to be directly viewed, comparison made  by report only. Changes from prior study are noted. 05/31/2017: LVEF   84-66%, normal diastolic function.    Carotid ultrasound 08/21/2021: Summary:  Right Carotid: Velocities in the right ICA are consistent with a 1-39%  stenosis.                 Non-hemodynamically significant plaque <50% noted in the  CCA.   Left Carotid: Velocities in the left ICA are consistent with a 1-39%  stenosis.                Non-hemodynamically significant plaque <50% noted in the  CCA.   Vertebrals:  Bilateral vertebral arteries demonstrate antegrade flow.  Subclavians: Normal flow hemodynamics were seen in bilateral subclavian               arteries.      Risk Assessment/Calculations:    CHA2DS2-VASc Score = 3   This indicates a 3.2% annual risk of stroke. The patient's score is based upon: CHF History: 0 HTN History: 1 Diabetes History: 0 Stroke History: 0 Vascular Disease History: 0 Age Score: 1 Gender Score: 1        ASSESSMENT:    1. Other chest pain   2. Paroxysmal atrial fibrillation (HCC)   3. Nonrheumatic aortic valve stenosis   4. Essential hypertension   5. Hyperlipidemia, unspecified hyperlipidemia type      PLAN:  In order of problems listed above:  Chest  pain -tightness off/on 6 hrs went to ED 10/27/21 labs/EKG stable.multiple CV risk factors.will order Coronary CTA/calcium score.   PAF on Eliquis-HR regular and in NSR in ED. No bleeding problems on eliquis. Labs in ED stable Crt.1.17, K 3.3-increase K in diet  Mild Aortic sclerosis on echo 08/21/21-to be follwed with echos every 3-4 yrs per Dr. Burt Bruce  HTN controlled  HLD LDL 109 in 08/2021-await Cor CTA to make adjustments  Former tobacco abuse  Shared Decision Making/Informed Consent        Medication Adjustments/Labs and Tests Ordered: Current medicines are reviewed at length with the patient today.  Concerns regarding medicines are outlined above.  Medication changes, Labs and Tests ordered today are listed in the Patient Instructions below. Patient Instructions   Medication Instructions:  Your physician recommends that you continue on your current medications as directed. Please refer to the Current Medication list given to you today.  Increase Potassium in your diet.  *If you need a refill on your cardiac medications before your next appointment, please call your pharmacy*   Lab Work: TODAY: BMET  If you have labs (blood work) drawn today and your tests are completely normal, you will receive your results only by: [place] (if you have MyChart) OR A paper copy in the mail If you have any lab test that is abnormal or we need to change your treatment, we will call you to review the results.   Testing/Procedures: Coronary CTA   Follow-Up: At [facility], you and your health needs are our priority.  As part of our continuing mission to provide you with exceptional heart care, we have created designated Provider Care Teams.  These Care Teams include your primary Cardiologist (physician) and Advanced Practice Providers (APPs -  Physician Assistants and Nurse Practitioners) who all work together to provide you with the care you need, when you need it.  We recommend signing up for the patient portal called "MyChart".  Sign up information is provided on this After Visit Summary.  MyChart is used to connect with patients for Virtual Visits (Telemedicine).  Patients are able to view lab/test results, encounter notes, upcoming appointments, etc.  Non-urgent messages can be sent to your provider as well.   To learn more about what you can do with MyChart, go to [email or web address].    Your next appointment:   01/16/2022 with Ermalinda Barrios, PA-C  Other Instructions See Letter for CT Instructions    Signed, Ermalinda Barrios, PA-C  11/29/2021 2:49 PM    [facility] [address], [place], [place]  [address] Phone: [phone]; Fax: [phone]

## 2021-11-29 ENCOUNTER — Encounter: Payer: Self-pay | Admitting: Physician Assistant

## 2021-11-29 ENCOUNTER — Ambulatory Visit: Payer: Medicare Other | Admitting: Physician Assistant

## 2021-11-29 ENCOUNTER — Other Ambulatory Visit: Payer: Self-pay

## 2021-11-29 VITALS — BP 118/60 | HR 81 | Ht 60.0 in | Wt 140.6 lb

## 2021-11-29 DIAGNOSIS — I350 Nonrheumatic aortic (valve) stenosis: Secondary | ICD-10-CM

## 2021-11-29 DIAGNOSIS — I480 Paroxysmal atrial fibrillation: Secondary | ICD-10-CM

## 2021-11-29 DIAGNOSIS — E785 Hyperlipidemia, unspecified: Secondary | ICD-10-CM

## 2021-11-29 DIAGNOSIS — R0789 Other chest pain: Secondary | ICD-10-CM

## 2021-11-29 DIAGNOSIS — I10 Essential (primary) hypertension: Secondary | ICD-10-CM

## 2021-11-29 MED ORDER — METOPROLOL TARTRATE 50 MG PO TABS: ORAL_TABLET | ORAL | 0 refills | Status: DC

## 2021-11-29 NOTE — Patient Instructions (Addendum)
Medication Instructions:  Your physician recommends that you continue on your current medications as directed. Please refer to the Current Medication list given to you today.  Increase Potassium in your diet.  *If you need a refill on your cardiac medications before your next appointment, please call your pharmacy*   Lab Work: TODAY: BMET  If you have labs (blood work) drawn today and your tests are completely normal, you will receive your results only by: [place] (if you have MyChart) OR A paper copy in the mail If you have any lab test that is abnormal or we need to change your treatment, we will call you to review the results.   Testing/Procedures: Coronary CTA   Follow-Up: At [facility], you and your health needs are our priority.  As part of our continuing mission to provide you with exceptional heart care, we have created designated Provider Care Teams.  These Care Teams include your primary Cardiologist (physician) and Advanced Practice Providers (APPs -  Physician Assistants and Nurse Practitioners) who all work together to provide you with the care you need, when you need it.  We recommend signing up for the patient portal called "MyChart".  Sign up information is provided on this After Visit Summary.  MyChart is used to connect with patients for Virtual Visits (Telemedicine).  Patients are able to view lab/test results, encounter notes, upcoming appointments, etc.  Non-urgent messages can be sent to your provider as well.   To learn more about what you can do with MyChart, go to [email or web address].    Your next appointment:   01/16/2022 with Ermalinda Barrios, PA-C  Other Instructions See Letter for CT Instructions

## 2021-11-30 DIAGNOSIS — N3001 Acute cystitis with hematuria: Secondary | ICD-10-CM | POA: Diagnosis not present

## 2021-11-30 LAB — BASIC METABOLIC PANEL
BUN/Creatinine Ratio: 33 — ABNORMAL HIGH (ref 12–28)
BUN: 38 mg/dL — ABNORMAL HIGH (ref 8–27)
CO2: 20 mmol/L (ref 20–29)
Calcium: 10.4 mg/dL — ABNORMAL HIGH (ref 8.7–10.3)
Chloride: 101 mmol/L (ref 96–106)
Creatinine, Ser: 1.15 mg/dL — ABNORMAL HIGH (ref 0.57–1.00)
Glucose: 97 mg/dL (ref 70–99)
Potassium: 3.7 mmol/L (ref 3.5–5.2)
Sodium: 140 mmol/L (ref 134–144)
eGFR: 50 mL/min/{1.73_m2} — ABNORMAL LOW (ref >59)

## 2021-12-08 ENCOUNTER — Telehealth (HOSPITAL_COMMUNITY): Payer: Self-pay | Admitting: Emergency Medicine

## 2021-12-08 DIAGNOSIS — I10 Essential (primary) hypertension: Secondary | ICD-10-CM

## 2021-12-08 DIAGNOSIS — R7989 Other specified abnormal findings of blood chemistry: Secondary | ICD-10-CM

## 2021-12-08 NOTE — Telephone Encounter (Signed)
Reaching out to patient to offer assistance regarding upcoming cardiac imaging study; pt verbalizes understanding of appt date/time, parking situation and where to check in, pre-test NPO status and medications ordered, and verified current allergies; name and call back number provided for further questions should they arise ?Sheila Bond RN Navigator Cardiac Imaging ?[facility] ?[phone] office ?[phone] cell ? ?Encouraged to hydrate with water ?Pt concerned about abnormal lab values related to kidney function.  I told her it might be possible to recheck BMP 1 wk after scan/contrast. ?Denies iv issues ?Taking 50mg  metoprolol ?Arrival 245 ?

## 2021-12-11 ENCOUNTER — Ambulatory Visit (HOSPITAL_COMMUNITY): Payer: Medicare Other

## 2021-12-11 ENCOUNTER — Other Ambulatory Visit: Payer: Self-pay

## 2021-12-11 ENCOUNTER — Ambulatory Visit (HOSPITAL_COMMUNITY)
Admission: RE | Admit: 2021-12-11 | Discharge: 2021-12-11 | Disposition: A | Discharge status: Home / Self Care | Payer: Medicare Other | Source: Ambulatory Visit | Attending: Physician Assistant | Admitting: Physician Assistant

## 2021-12-11 DIAGNOSIS — E785 Hyperlipidemia, unspecified: Secondary | ICD-10-CM | POA: Insufficient documentation

## 2021-12-11 DIAGNOSIS — I10 Essential (primary) hypertension: Secondary | ICD-10-CM | POA: Diagnosis not present

## 2021-12-11 DIAGNOSIS — I480 Paroxysmal atrial fibrillation: Secondary | ICD-10-CM | POA: Diagnosis not present

## 2021-12-11 DIAGNOSIS — I350 Nonrheumatic aortic (valve) stenosis: Secondary | ICD-10-CM | POA: Diagnosis not present

## 2021-12-11 DIAGNOSIS — I700 Atherosclerosis of aorta: Secondary | ICD-10-CM | POA: Diagnosis not present

## 2021-12-11 DIAGNOSIS — R0789 Other chest pain: Secondary | ICD-10-CM | POA: Diagnosis not present

## 2021-12-11 MED ORDER — NITROGLYCERIN 0.4 MG SL SUBL
0.8000 mg | SUBLINGUAL_TABLET | Freq: Once | SUBLINGUAL | Status: AC
  Administered 2021-12-11: 0.8 mg via SUBLINGUAL

## 2021-12-11 MED ORDER — IOHEXOL 350 MG/ML SOLN
95.0000 mL | Freq: Once | INTRAVENOUS | Status: AC | PRN
  Administered 2021-12-11: 95 mL via INTRAVENOUS

## 2021-12-11 MED ORDER — NITROGLYCERIN 0.4 MG SL SUBL
SUBLINGUAL_TABLET | SUBLINGUAL | Status: AC
  Filled 2021-12-11: qty 2

## 2021-12-12 NOTE — Telephone Encounter (Signed)
LM for pt to call back.

## 2021-12-13 ENCOUNTER — Telehealth: Payer: Self-pay | Admitting: Cardiovascular Disease

## 2021-12-13 DIAGNOSIS — E785 Hyperlipidemia, unspecified: Secondary | ICD-10-CM

## 2021-12-13 MED ORDER — ATORVASTATIN CALCIUM 40 MG PO TABS: 40.0000 mg | ORAL_TABLET | Freq: Every day | ORAL | 3 refills | Status: DC

## 2021-12-13 NOTE — Telephone Encounter (Signed)
-----   Message from Imogene Burn, PA-C sent at 12/12/2021  7:51 AM EST ----- ?CT shows mild atherosclerosis so need aggressive risk factor modification. This is not the cause of her chest pain. Please increase lipitor 40 mg daily and repeat FLP in 3 months. LDL goal less than 70. thanks ?

## 2021-12-13 NOTE — Telephone Encounter (Signed)
Patient returning call in regards to CT results.  ?

## 2021-12-13 NOTE — Telephone Encounter (Signed)
Patient notified.  Prescription sent to [facility] on [address].  She will come in for fasting lab work on March 15, 2022 ?

## 2021-12-13 NOTE — Telephone Encounter (Signed)
Imogene Burn, PA-C ?Kidney function mildly elevated. May need to decrease HCTZ 12.5 mg every other day. Keep a check on BP and repeat bmet in 1-2 weeks. Thanks ? ?I spoke with patient and gave her this information.  She will continue HCTZ as she has been taking for now and check BP and keep record.  Will have readings available when called with lab results.  Patient will come in for BMP on 12/22/21 ?

## 2021-12-13 NOTE — Addendum Note (Signed)
Addended by: Thompson Grayer on: 12/13/2021 11:12 AM ? ? Modules accepted: Orders ? ?

## 2021-12-22 ENCOUNTER — Other Ambulatory Visit: Payer: Medicare Other

## 2021-12-25 ENCOUNTER — Other Ambulatory Visit: Payer: Medicare Other | Admitting: *Deleted

## 2021-12-25 ENCOUNTER — Other Ambulatory Visit: Payer: Self-pay

## 2021-12-25 DIAGNOSIS — I10 Essential (primary) hypertension: Secondary | ICD-10-CM

## 2021-12-25 DIAGNOSIS — R7989 Other specified abnormal findings of blood chemistry: Secondary | ICD-10-CM

## 2021-12-25 LAB — BASIC METABOLIC PANEL
Anion gap: 9 (ref 5–15)
BUN: 26 mg/dL — ABNORMAL HIGH (ref 8–23)
CO2: 28 mmol/L (ref 22–32)
Calcium: 10 mg/dL (ref 8.9–10.3)
Chloride: 102 mmol/L (ref 98–111)
Creatinine, Ser: 1.08 mg/dL — ABNORMAL HIGH (ref 0.44–1.00)
GFR, Estimated: 54 mL/min — ABNORMAL LOW (ref >60)
Glucose, Bld: 95 mg/dL (ref 70–99)
Potassium: 4.1 mmol/L (ref 3.5–5.1)
Sodium: 139 mmol/L (ref 135–145)

## 2022-01-03 NOTE — Progress Notes (Signed)
? ?Cardiology Office Note   ? ?Date:  01/16/2022  ? ?ID:  [name], [name] 1946-06-27, MRN [id number] ? ? ?PCP:  Kathyrn Lass, MD ?  ?[place]  ?Cardiologist:  Sherren Mocha, MD   ?Advanced Practice Provider:  No care team member to display ?Electrophysiologist:  None  ? ?[id number]}  ? ?Chief Complaint  ?Patient presents with  ?? Follow-up  ? ? ?History of Present Illness:  ?Sheila Bruce is a 76 y.o. 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 ?  ?Patient saw Dr. Burt Knack 08/23/21 and doing well. Plan to repeat echo 3-4 yrs. ?  ?In ED chest pain 10/26/21 and troponins negative EKG no change. Dr. Burt Knack reviewed and said to do Lexi if recurrent chest pain. ?  ?Patient says it was a little pressure in center of her chest that would come and go for 6 hrs and a little dizzy. Started after lunch. Became anxious and went to ED. No reflux, radiation, dyspnea, palpitations. Exertion didn't make it worse but occurred while reading.  ? ?Coronary CTA calcium score 207 25-49% LAD. Statin increased. ? ?Patient comes in for f/u. Went over CTA results. No further chest pain. Walks dog 1/2 hr daily. Going to start gardening. ? ? ? ?Past Medical History:  ?Diagnosis Date  ?? Anxiety   ?? Habitual alcohol use   ?? Heart murmur   ?? Hx of cardiovascular stress test   ? a. ETT-Myoview 5/14:  Normal; EF 79%, no ischemia, exercised 10:00.  ?? Hyperlipidemia   ?? Hypertension   ?? PAF (paroxysmal atrial fibrillation) ([place])   ? a. Echo 4/14: Mild concentric LVH, EF 47-82%, grade 1 diastolic dysfunction, trivial MR, PASP 29;  b. Eliquis 5 bid  ?? Vertigo   ? ? ?Past Surgical History:  ?Procedure Laterality Date  ?? ABDOMINAL HYSTERECTOMY    ?? BREAST EXCISIONAL BIOPSY    ?? CYSTOSCOPY    ?? Laproscopy-Abdominal    ?? TENDON REPAIR    ? Left hand  ? ? ?Current Medications: ?Current  Meds  ?Medication Sig  ?? apixaban (ELIQUIS) 5 MG TABS tablet Take 1 tablet (5 mg total) by mouth 2 (two) times daily.  ?? atorvastatin (LIPITOR) 40 MG tablet Take 1 tablet (40 mg total) by mouth daily.  ?? BIOTIN PO Take by mouth.  ?? Cholecalciferol (VITAMIN D3) 50 MCG (2000 UT) CAPS Take 1 capsule by mouth daily.  ?? diltiazem (CARDIZEM CD) 240 MG 24 hr capsule Take 240 mg by mouth daily.  ?? famotidine (PEPCID) 20 MG tablet 1 tablet at bedtime as needed  ?? fluticasone (FLONASE) 50 MCG/ACT nasal spray Place 2 sprays into the nose daily.  ?? hydrochlorothiazide (HYDRODIURIL) 25 MG tablet Take 25 mg by mouth every morning.  ?? ibuprofen (ADVIL) 200 MG tablet Take 200 mg by mouth as needed for pain.  ?? loratadine (CLARITIN) 10 MG tablet Take 10 mg by mouth daily.  ?? losartan (COZAAR) 100 MG tablet Take 100 mg by mouth daily.  ?? metoprolol tartrate (LOPRESSOR) 50 MG tablet Take 1 tablet 2 hours prior to CT Scan  ?? Multiple Vitamin (MULTIVITAMIN) tablet Take 1 tablet by mouth daily.  ?? Omega-3 Fatty Acids (FISH OIL) 500 MG CAPS Take by mouth.  ?? vitamin E 180 MG (400 UNITS) capsule 1 capsule  ?  ? ?Allergies:   Ciprocinonide [fluocinolone], Codeine, Ciprofloxacin,  and Doxycycline  ? ?Social History  ? ?Socioeconomic History  ?? Marital status: Married  ?  Spouse name: Not on file  ?? Number of children: Not on file  ?? Years of education: Not on file  ?? Highest education level: Not on file  ?Occupational History  ?? Not on file  ?Tobacco Use  ?? Smoking status: Former  ?  Types: Cigarettes  ?  Quit date: 01/12/2005  ?  Years since quitting: 17.0  ?? Smokeless tobacco: Never  ?? Tobacco comments:  ?  Smoked for 35 yrs - As of 08/2015  chews nicotine gum  ?Substance and Sexual Activity  ?? Alcohol use: Not Currently  ?  Alcohol/week: 1.0 standard drink  ?  Types: 1 Glasses of wine per week  ?  Comment: Occasional use now - less than one per day during week, Occasional 2-3 on the weekend.  ?? Drug use: No  ??  Sexual activity: Never  ?Other Topics Concern  ?? Not on file  ?Social History Narrative  ?? Not on file  ? ?Social Determinants of Health  ? ?Financial Resource Strain: Not on file  ?Food Insecurity: Not on file  ?Transportation Needs: Not on file  ?Physical Activity: Not on file  ?Stress: Not on file  ?Social Connections: Not on file  ?  ? ?Family History:  The patient's  family history includes Autoimmune disease in an other family member; Breast cancer in her maternal grandmother and paternal grandmother; CAD in some other family members; High Cholesterol in her father; Hyperlipidemia in an other family member; Hypertension in an other family member; Peripheral vascular disease in her mother.  ? ?ROS:   ?Please see the history of present illness.    ?ROS All other systems reviewed and are negative. ? ? ?PHYSICAL EXAM:   ?VS:  BP 132/70 (BP Location: Right Arm, Patient Position: Sitting, Cuff Size: Normal)   Pulse 73   Ht 5' (1.524 m)   Wt 141 lb 9.6 oz (64.2 kg)   SpO2 98%   BMI 27.65 kg/m?   ?Physical Exam  ?GEN: Well nourished, well developed, in no acute distress  ?Neck: no JVD, carotid bruits, or masses ?Cardiac:RRR; 2/6 systolic murmur LSB ?Respiratory:  clear to auscultation bilaterally, normal work of breathing ?GI: soft, nontender, nondistended, + BS ?Ext: without cyanosis, clubbing, or edema, Good distal pulses bilaterally ?Neuro:  Alert and Oriented x 3,Psych: euthymic mood, full affect ? ?Wt Readings from Last 3 Encounters:  ?01/16/22 141 lb 9.6 oz (64.2 kg)  ?11/29/21 140 lb 9.6 oz (63.8 kg)  ?10/27/21 142 lb (64.4 kg)  ?  ? ? ?Studies/Labs Reviewed:  ? ?EKG:  EKG is not ordered today.    ? ?Recent Labs: ?10/27/2021: ALT 42; B Natriuretic Peptide 18.6; Hemoglobin 12.9; Platelets 240 ?12/25/2021: BUN 26; Creatinine, Ser 1.08; Potassium 4.1; Sodium 139  ? ?Lipid Panel ?   ?Component Value Date/Time  ? CHOL  04/01/2010 0445  ?  172        ?ATP III CLASSIFICATION: ? <200     mg/dL   Desirable ?  200-239  mg/dL   Borderline High ? >=240    mg/dL   High ?        ? TRIG 116 04/01/2010 0445  ? HDL 68 04/01/2010 0445  ? CHOLHDL 2.5 04/01/2010 0445  ? VLDL 23 04/01/2010 0445  ? [place]  04/01/2010 0445  ?  81        ?Total Cholesterol/HDL:CHD Risk ?Coronary  Heart Disease Risk Table ?                    Men   Women ? 1/2 Average Risk   3.4   3.3 ? Average Risk       5.0   4.4 ? 2 X Average Risk   9.6   7.1 ? 3 X Average Risk  23.4   11.0 ?       ?Use the calculated Patient Ratio ?above and the CHD Risk Table ?to determine the patient's CHD Risk. ?       ?ATP III CLASSIFICATION (LDL): ? <100     mg/dL   Optimal ? 100-129  mg/dL   Near or Above ?                   Optimal ? 130-159  mg/dL   Borderline ? 160-189  mg/dL   High ? >190     mg/dL   Very High  ? ? ?Additional studies/ records that were reviewed today include:  ?Coronary CTA 12/11/21 ?FINDINGS: ?Quality: Good, HR 63 ?  ?Coronary calcium score: The patient's coronary artery calcium score ?is 207, which places the patient in the 76th percentile. ?  ?Coronary arteries: Normal coronary origins.  Right dominance. ?  ?Right Coronary Artery: Dominant.  No significant disease. ?  ?Left Main Coronary Artery: Normal. Bifurcates into the LAD and LCx ?arteries. ?  ?Left Anterior Descending Coronary Artery: Anterior artery that wraps ?around the apex. There is mild 25-49% mixed proximal stenosis ?(CADRADS2). 2 small diagonal branches without disease. ?  ?Left Circumflex Artery: AV groove vessel - no disease. ?  ?Aorta: Normal size, 31 mm at the mid ascending aorta (level of the ?PA bifurcation) measured double oblique. Aortic atherosclerosis. No ?dissection. ?  ?Aortic Valve: Trileaflet. No calcifications. ?  ?Other findings: ?  ?Normal pulmonary vein drainage into the left atrium. ?  ?Normal left atrial appendage without a thrombus. ?  ?Normal size of the pulmonary artery. ?  ?IMPRESSION: ?1. 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 ?  ?2. Coronary calcium  score of 207. This was 76th percentile for age ?and sex matched control. ?  ?3. Normal coronary origin with right dominance. ?  ?4. Aortic atherosclerosis. ?  ?5. Aggressive cardiovascular risk factor modification is ?recom

## 2022-01-05 DIAGNOSIS — Z0000 Encounter for general adult medical examination without abnormal findings: Secondary | ICD-10-CM | POA: Diagnosis not present

## 2022-01-16 ENCOUNTER — Encounter: Payer: Self-pay | Admitting: Physician Assistant

## 2022-01-16 ENCOUNTER — Ambulatory Visit: Payer: Medicare Other | Admitting: Physician Assistant

## 2022-01-16 VITALS — BP 132/70 | HR 73 | Ht 60.0 in | Wt 141.6 lb

## 2022-01-16 DIAGNOSIS — Z87891 Personal history of nicotine dependence: Secondary | ICD-10-CM | POA: Diagnosis not present

## 2022-01-16 DIAGNOSIS — I2510 Atherosclerotic heart disease of native coronary artery without angina pectoris: Secondary | ICD-10-CM | POA: Diagnosis not present

## 2022-01-16 DIAGNOSIS — E785 Hyperlipidemia, unspecified: Secondary | ICD-10-CM

## 2022-01-16 DIAGNOSIS — I480 Paroxysmal atrial fibrillation: Secondary | ICD-10-CM

## 2022-01-16 DIAGNOSIS — I10 Essential (primary) hypertension: Secondary | ICD-10-CM | POA: Diagnosis not present

## 2022-01-16 NOTE — Patient Instructions (Signed)
Medication Instructions:  ?Your physician recommends that you continue on your current medications as directed. Please refer to the Current Medication list given to you today. ? ?*If you need a refill on your cardiac medications before your next appointment, please call your pharmacy* ? ? ?Lab Work: ?None ordered ? ?If you have labs (blood work) drawn today and your tests are completely normal, you will receive your results only by: ?MyChart Message (if you have MyChart) OR ?A paper copy in the mail ?If you have any lab test that is abnormal or we need to change your treatment, we will call you to review the results. ? ? ?Testing/Procedures: ?None ordered ? ? ?Follow-Up: ?At [facility], you and your health needs are our priority.  As part of our continuing mission to provide you with exceptional heart care, we have created designated Provider Care Teams.  These Care Teams include your primary Cardiologist (physician) and Advanced Practice Providers (APPs -  Physician Assistants and Nurse Practitioners) who all work together to provide you with the care you need, when you need it. ? ?We recommend signing up for the patient portal called "MyChart".  Sign up information is provided on this After Visit Summary.  MyChart is used to connect with patients for Virtual Visits (Telemedicine).  Patients are able to view lab/test results, encounter notes, upcoming appointments, etc.  Non-urgent messages can be sent to your provider as well.   ?To learn more about what you can do with MyChart, go to [email or web address].   ? ?Your next appointment:   ?6 month(s) ? ?The format for your next appointment:   ?In Person ? ?Provider:   ?Sherren Mocha, MD  or Ermalinda Barrios, PA-C       ? ? ?Other Instructions ? ? ?Important Information About Sugar ? ? ? ? ?  ?

## 2022-01-22 DIAGNOSIS — I129 Hypertensive chronic kidney disease with stage 1 through stage 4 chronic kidney disease, or unspecified chronic kidney disease: Secondary | ICD-10-CM | POA: Diagnosis not present

## 2022-01-22 DIAGNOSIS — I4819 Other persistent atrial fibrillation: Secondary | ICD-10-CM | POA: Diagnosis not present

## 2022-01-22 DIAGNOSIS — E7800 Pure hypercholesterolemia, unspecified: Secondary | ICD-10-CM | POA: Diagnosis not present

## 2022-01-22 DIAGNOSIS — R7303 Prediabetes: Secondary | ICD-10-CM | POA: Diagnosis not present

## 2022-01-22 DIAGNOSIS — N1831 Chronic kidney disease, stage 3a: Secondary | ICD-10-CM | POA: Diagnosis not present

## 2022-01-22 DIAGNOSIS — I700 Atherosclerosis of aorta: Secondary | ICD-10-CM | POA: Diagnosis not present

## 2022-03-15 ENCOUNTER — Other Ambulatory Visit: Payer: Medicare Other

## 2022-03-15 DIAGNOSIS — E785 Hyperlipidemia, unspecified: Secondary | ICD-10-CM | POA: Diagnosis not present

## 2022-03-15 DIAGNOSIS — I10 Essential (primary) hypertension: Secondary | ICD-10-CM

## 2022-03-15 DIAGNOSIS — I2510 Atherosclerotic heart disease of native coronary artery without angina pectoris: Secondary | ICD-10-CM

## 2022-03-15 DIAGNOSIS — I480 Paroxysmal atrial fibrillation: Secondary | ICD-10-CM | POA: Diagnosis not present

## 2022-03-15 DIAGNOSIS — Z87891 Personal history of nicotine dependence: Secondary | ICD-10-CM

## 2022-03-15 LAB — LIPID PANEL
Chol/HDL Ratio: 3.3 ratio (ref 0.0–4.4)
Cholesterol, Total: 167 mg/dL (ref 100–199)
HDL: 51 mg/dL (ref >39)
LDL Chol Calc (NIH): 87 mg/dL (ref 0–99)
Triglycerides: 171 mg/dL — ABNORMAL HIGH (ref 0–149)
VLDL Cholesterol Cal: 29 mg/dL (ref 5–40)

## 2022-03-15 LAB — BASIC METABOLIC PANEL
BUN/Creatinine Ratio: 23 (ref 12–28)
BUN: 21 mg/dL (ref 8–27)
CO2: 28 mmol/L (ref 20–29)
Calcium: 10.4 mg/dL — ABNORMAL HIGH (ref 8.7–10.3)
Chloride: 102 mmol/L (ref 96–106)
Creatinine, Ser: 0.9 mg/dL (ref 0.57–1.00)
Glucose: 108 mg/dL — ABNORMAL HIGH (ref 70–99)
Potassium: 4.1 mmol/L (ref 3.5–5.2)
Sodium: 141 mmol/L (ref 134–144)
eGFR: 68 mL/min/{1.73_m2} (ref >59)

## 2022-03-16 ENCOUNTER — Other Ambulatory Visit: Payer: Self-pay

## 2022-03-16 DIAGNOSIS — E785 Hyperlipidemia, unspecified: Secondary | ICD-10-CM

## 2022-03-16 MED ORDER — ATORVASTATIN CALCIUM 80 MG PO TABS: 80.0000 mg | ORAL_TABLET | Freq: Every day | ORAL | 3 refills | Status: DC

## 2022-03-21 ENCOUNTER — Telehealth: Payer: Self-pay

## 2022-03-21 NOTE — Telephone Encounter (Signed)
Pt called in for lab results. I gave her results and recommendations. She is hesitant about increasing her Atorvastatin. I suggested that she slowly increase it starting with every other day and if she can tolerate that she can increase it daily.  She agreed to try this and will call back with any concerns.

## 2022-04-02 ENCOUNTER — Other Ambulatory Visit: Payer: Self-pay | Admitting: Family Medicine

## 2022-04-02 DIAGNOSIS — Z1231 Encounter for screening mammogram for malignant neoplasm of breast: Secondary | ICD-10-CM

## 2022-04-27 ENCOUNTER — Ambulatory Visit
Admission: RE | Admit: 2022-04-27 | Discharge: 2022-04-27 | Disposition: A | Discharge status: Home / Self Care | Payer: Medicare Other | Source: Ambulatory Visit | Attending: Family Medicine | Admitting: Family Medicine

## 2022-04-27 DIAGNOSIS — Z1231 Encounter for screening mammogram for malignant neoplasm of breast: Secondary | ICD-10-CM | POA: Diagnosis not present

## 2022-05-24 ENCOUNTER — Telehealth: Payer: Self-pay | Admitting: Cardiovascular Disease

## 2022-05-24 NOTE — Telephone Encounter (Signed)
Returned call to patient. Informed of the patient assistance application process and that she can print out the form online via the [employer] website if she searches "Eliquis patient assistance form  2023." Advised that they will want a copy of last year's tax return. She states she has done things like this in the past and was told her household income was too high. She is now on Medicare and hopes this will not be the case, as her best guess of her AHI is around 40k. She states they do not have a printer at home, but will see if her husband can do it for her at his work. Told her I would forward this to [facility] to touch base with her next week to see if there's more information she needs or maybe we can email the link to her for the app and have her fill it out and email it back (since she doesn't have printer), also advised she could come by office for it. She will see what she can accomplish and anticipates [employer] Hawking will reach out to her next week regarding her income requirement question. Routing to [employer].

## 2022-05-24 NOTE — Telephone Encounter (Signed)
Pt c/o medication issue:  1. Name of Medication:   apixaban (ELIQUIS) 5 MG TABS tablet    2. How are you currently taking this medication (dosage and times per day)? Take 1 tablet (5 mg total) by mouth 2 (two) times daily.  3. Are you having a reaction (difficulty breathing--STAT)? No  4. What is your medication issue? Pt would like a callback regarding some type of Pt Assistance for medication being that cost has gone up. Please advise

## 2022-05-28 NOTE — Telephone Encounter (Signed)
**Note De-Identified Sheila Bruce Obfuscation** The pt states that she has not reached out to [facility] yet concerning her eligibility to be approved for their Eliquis asst program so she has no questions for me at this time.  She is requesting that I send a message to Dr Burt Knack letting him know that her pharmacy advised her yesterday that there is a warning against taking Diltiazem and Eliquis together as there is an increased risk of a GI bleed. She wants to know if there is another medication she can take that does not have this warning.  She is aware that we will call her with Dr York Cerise recommendation when available.  She thanked me for my call.

## 2022-05-30 NOTE — Telephone Encounter (Signed)
Called and spoke with patient directly who understands that she has been on Eliquis and Diltiazem for quite a while and doesn't feel she needs to change, but was alerted by her pharmacy to call us d/y increased risk of GI bleeding. Patient is fine for continued use. She appreciates call and understands the need to be on both and that eliquis carries the least bleeding risk.

## 2022-05-30 NOTE — Telephone Encounter (Signed)
Patient contacted.  She has tolerated these medications in combination now for several years and has done well with no bleeding complications.  While this is a potential interaction, these are very common medicines to be prescribed together for patients with atrial fibrillation.  I did not recommend making any changes at this time.

## 2022-06-15 ENCOUNTER — Ambulatory Visit: Payer: Medicare Other | Attending: Physician Assistant

## 2022-06-15 DIAGNOSIS — E785 Hyperlipidemia, unspecified: Secondary | ICD-10-CM | POA: Diagnosis not present

## 2022-06-16 LAB — LIPID PANEL
Chol/HDL Ratio: 2.7 ratio (ref 0.0–4.4)
Cholesterol, Total: 153 mg/dL (ref 100–199)
HDL: 56 mg/dL (ref >39)
LDL Chol Calc (NIH): 74 mg/dL (ref 0–99)
Triglycerides: 135 mg/dL (ref 0–149)
VLDL Cholesterol Cal: 23 mg/dL (ref 5–40)

## 2022-07-30 DIAGNOSIS — Z23 Encounter for immunization: Secondary | ICD-10-CM | POA: Diagnosis not present

## 2022-07-30 DIAGNOSIS — D6869 Other thrombophilia: Secondary | ICD-10-CM | POA: Diagnosis not present

## 2022-07-30 DIAGNOSIS — R7303 Prediabetes: Secondary | ICD-10-CM | POA: Diagnosis not present

## 2022-07-30 DIAGNOSIS — E7800 Pure hypercholesterolemia, unspecified: Secondary | ICD-10-CM | POA: Diagnosis not present

## 2022-07-30 DIAGNOSIS — I129 Hypertensive chronic kidney disease with stage 1 through stage 4 chronic kidney disease, or unspecified chronic kidney disease: Secondary | ICD-10-CM | POA: Diagnosis not present

## 2022-08-28 NOTE — Progress Notes (Signed)
Cardiology Office Note:    Date:  09/11/2022   ID:  Sheila Bruce, DOB 1946-09-15, MRN [id number]  PCP:  Kathyrn Lass, Ethel Providers Cardiologist:  Sherren Mocha, MD     Referring MD: Kathyrn Lass, MD   Chief Complaint:  No chief complaint on file.     History of Present Illness:   Sheila Bruce is a 76 y.o. 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   Patient saw Dr. Burt Knack 08/23/21 and doing well. Plan to repeat echo 3-4 yrs.   In ED chest pain 10/26/21 and troponins negative EKG no change. Dr. Burt Knack reviewed and said to do Lexi if recurrent chest pain.   Coronary CTA calcium score 12/2021- 207 25-49% LAD. Statin increased.  I saw the patient 01/2022 and she was doing well.  Patient comes in for f/u. PCP stopped HCTZ b/c of low BP. Ocassional palpitations lasts 10-15 sec. No chest pain or dyspnea with it. No edema. No bleeding problems on eliquis. No regular exercise. She does gardening and walks her dog. She golfs regularly.         Past Medical History:  Diagnosis Date   Anxiety    Habitual alcohol use    Heart murmur    Hx of cardiovascular stress test    a. ETT-Myoview 5/14:  Normal; EF 79%, no ischemia, exercised 10:00.   Hyperlipidemia    Hypertension    PAF (paroxysmal atrial fibrillation) ([place])    a. Echo 4/14: Mild concentric LVH, EF 44-01%, grade 1 diastolic dysfunction, trivial MR, PASP 29;  b. Eliquis 5 bid   Vertigo    Current Medications: Current Meds  Medication Sig   apixaban (ELIQUIS) 5 MG TABS tablet Take 1 tablet (5 mg total) by mouth 2 (two) times daily.   atorvastatin (LIPITOR) 80 MG tablet Take 1 tablet (80 mg total) by mouth daily.   BIOTIN PO Take by mouth.   Cholecalciferol (VITAMIN D3) 50 MCG (2000 UT) CAPS Take 1 capsule by mouth daily.   diltiazem (CARDIZEM CD) 240 MG 24 hr capsule Take  240 mg by mouth daily.   famotidine (PEPCID) 20 MG tablet 1 tablet at bedtime as needed   fluticasone (FLONASE) 50 MCG/ACT nasal spray Place 2 sprays into the nose daily.   ibuprofen (ADVIL) 200 MG tablet Take 200 mg by mouth as needed for pain.   loratadine (CLARITIN) 10 MG tablet Take 10 mg by mouth daily.   losartan (COZAAR) 100 MG tablet Take 100 mg by mouth daily.   Multiple Vitamin (MULTIVITAMIN) tablet Take 1 tablet by mouth daily.   Omega-3 Fatty Acids (FISH OIL) 500 MG CAPS Take by mouth daily. Per patient taking 1200 mg everyday   vitamin E 180 MG (400 UNITS) capsule 1 capsule   [DISCONTINUED] conjugated estrogens (PREMARIN) vaginal cream Place 1 Applicatorful vaginally as directed.   [DISCONTINUED] hydrochlorothiazide (HYDRODIURIL) 25 MG tablet Take 25 mg by mouth every morning.   [DISCONTINUED] metoprolol tartrate (LOPRESSOR) 50 MG tablet Take 1 tablet 2 hours prior to CT Scan    Allergies:   Ciprocinonide [fluocinolone], Codeine, Ciprofloxacin, and Doxycycline   Social History   Tobacco Use   Smoking status: Former    Types: Cigarettes    Quit date: 01/12/2005    Years since quitting: 17.6   Smokeless tobacco: Never   Tobacco comments:    Smoked  for 35 yrs - As of 08/2015  chews nicotine gum  Substance Use Topics   Alcohol use: Not Currently    Alcohol/week: 1.0 standard drink of alcohol    Types: 1 Glasses of wine per week    Comment: Occasional use now - less than one per day during week, Occasional 2-3 on the weekend.   Drug use: No    Family Hx: The patient's family history includes Autoimmune disease in an other family member; Breast cancer in her maternal grandmother and paternal grandmother; CAD in some other family members; Heart disease (age of onset: 60) in her father; High Cholesterol in her father; Hyperlipidemia in an other family member; Hypertension in an other family member; Peripheral vascular disease in her mother.  ROS     Physical Exam:    VS:   BP 130/70   Pulse 65   Ht 5' (1.524 m)   Wt 143 lb 9.6 oz (65.1 kg)   SpO2 97%   BMI 28.04 kg/m     Wt Readings from Last 3 Encounters:  09/11/22 143 lb 9.6 oz (65.1 kg)  01/16/22 141 lb 9.6 oz (64.2 kg)  11/29/21 140 lb 9.6 oz (63.8 kg)    Physical Exam  GEN: Well nourished, well developed, in no acute distress  Neck: no JVD, carotid bruits, or masses Cardiac:RRR;  2/6 systolic murmur LSB Respiratory:  clear to auscultation bilaterally, normal work of breathing GI: soft, nontender, nondistended,    Ext: without cyanosis, clubbing, or edema, Good distal pulses bilaterally Neuro:  Alert and Oriented x 3,  Psych: euthymic mood, full affect        EKGs/Labs/Other Test Reviewed:    EKG:  EKG is   ordered today.  The ekg ordered today demonstrates NSR normal EKG  Recent Labs: 10/27/2021: ALT 42; B Natriuretic Peptide 18.6; Hemoglobin 12.9; Platelets 240 03/15/2022: BUN 21; Creatinine, Ser 0.90; Potassium 4.1; Sodium 141   Recent Lipid Panel Recent Labs    06/15/22 0824  CHOL 153  TRIG 135  HDL 56  LDLCALC 74     Prior CV Studies:   Coronary CTA 12/11/21 FINDINGS: Quality: Good, HR 63   Coronary calcium score: The patient's coronary artery calcium score is 207, which places the patient in the 76th percentile.   Coronary arteries: Normal coronary origins.  Right dominance.   Right Coronary Artery: Dominant.  No significant disease.   Left Main Coronary Artery: Normal. Bifurcates into the LAD and LCx arteries.   Left Anterior Descending Coronary Artery: Anterior artery that wraps around the apex. There is mild 25-49% mixed proximal stenosis (CADRADS2). 2 small diagonal branches without disease.   Left Circumflex Artery: AV groove vessel - no disease.   Aorta: Normal size, 31 mm at the mid ascending aorta (level of the PA bifurcation) measured double oblique. Aortic atherosclerosis. No dissection.   Aortic Valve: Trileaflet. No calcifications.   Other  findings:   Normal pulmonary vein drainage into the left atrium.   Normal left atrial appendage without a thrombus.   Normal size of the pulmonary artery.   IMPRESSION: 1. 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   2. Coronary calcium score of 207. This was 76th percentile for age and sex matched control.   3. Normal coronary origin with right dominance.   4. Aortic atherosclerosis.   5. Aggressive cardiovascular risk factor modification is recommended.     Electronically Signed   By: Pixie Casino M.D.   On: 12/11/2021  17:06   Risk Assessment/Calculations/Metrics:    CHA2DS2-VASc Score = 4   This indicates a 4.8% annual risk of stroke. The patient's score is based upon: CHF History: 0 HTN History: 1 Diabetes History: 0 Stroke History: 0 Vascular Disease History: 1 Age Score: 1 Gender Score: 1             ASSESSMENT & PLAN:   No problem-specific Assessment & Plan notes found for this encounter.   Chest pain -tightness off/on 6 hrs went to ED 10/27/21 labs/EKG stable.multiple CV risk factors. Coronary CTA/calcium score 207 25-49% LAD. Statin increased and LDL 74 on f/u. 150 min exercise   PAF on Eliquis-HR regular and in NSR . No bleeding problems on eliquis.   Mild Aortic sclerosis on echo 08/21/21-to be followed with echos every 3-4 yrs per Dr. Burt Knack   HTN overall controlled-HCTZ stopped by PCP for low BP   HLD  LDL goal less than 70 LDL 109 in 08/2021-lipitor increased 40 mg daily. Recheck in Sept 74.   Former tobacco abuse            Dispo:  No follow-ups on file.   Medication Adjustments/Labs and Tests Ordered: Current medicines are reviewed at length with the patient today.  Concerns regarding medicines are outlined above.  Tests Ordered: No orders of the defined types were placed in this encounter.  Medication Changes: No orders of the defined types were placed in this encounter.  Signed, Ermalinda Barrios, PA-C  09/11/2022 11:04  AM    [address], [place], [place]  [address] Phone: [phone]; Fax: [phone]

## 2022-09-06 DIAGNOSIS — H35371 Puckering of macula, right eye: Secondary | ICD-10-CM | POA: Diagnosis not present

## 2022-09-11 ENCOUNTER — Encounter: Payer: Self-pay | Admitting: Physician Assistant

## 2022-09-11 ENCOUNTER — Ambulatory Visit: Payer: Medicare Other | Attending: Physician Assistant | Admitting: Physician Assistant

## 2022-09-11 VITALS — BP 130/70 | HR 65 | Ht 60.0 in | Wt 143.6 lb

## 2022-09-11 DIAGNOSIS — I2510 Atherosclerotic heart disease of native coronary artery without angina pectoris: Secondary | ICD-10-CM | POA: Diagnosis not present

## 2022-09-11 DIAGNOSIS — I10 Essential (primary) hypertension: Secondary | ICD-10-CM | POA: Diagnosis not present

## 2022-09-11 DIAGNOSIS — E785 Hyperlipidemia, unspecified: Secondary | ICD-10-CM | POA: Diagnosis not present

## 2022-09-11 DIAGNOSIS — I350 Nonrheumatic aortic (valve) stenosis: Secondary | ICD-10-CM | POA: Diagnosis not present

## 2022-09-11 DIAGNOSIS — Z87891 Personal history of nicotine dependence: Secondary | ICD-10-CM | POA: Diagnosis not present

## 2022-09-11 DIAGNOSIS — I480 Paroxysmal atrial fibrillation: Secondary | ICD-10-CM | POA: Diagnosis not present

## 2022-09-11 NOTE — Patient Instructions (Signed)
Medication Instructions:  Your physician recommends that you continue on your current medications as directed. Please refer to the Current Medication list given to you today.  *If you need a refill on your cardiac medications before your next appointment, please call your pharmacy*  Follow-Up: At [facility], you and your health needs are our priority.  As part of our continuing mission to provide you with exceptional heart care, we have created designated Provider Care Teams.  These Care Teams include your primary Cardiologist (physician) and Advanced Practice Providers (APPs -  Physician Assistants and Nurse Practitioners) who all work together to provide you with the care you need, when you need it.  Your next appointment:   6 month(s)  The format for your next appointment:   In Person  Provider:   Sherren Mocha, MD     Other Instructions Your physician discussed the importance of regular exercise and recommended that you start or continue a regular exercise program for good health. It is recommended that you get at least 150 minutes of exercise per week.   Important Information About Sugar

## 2022-10-01 ENCOUNTER — Other Ambulatory Visit: Payer: Self-pay

## 2022-10-01 ENCOUNTER — Emergency Department (HOSPITAL_COMMUNITY)
Admission: EM | Admit: 2022-10-01 | Discharge: 2022-10-02 | Disposition: A | Discharge status: Home / Self Care | Payer: Medicare Other | Attending: Emergency Medicine | Admitting: Emergency Medicine

## 2022-10-01 DIAGNOSIS — R0789 Other chest pain: Secondary | ICD-10-CM | POA: Insufficient documentation

## 2022-10-01 DIAGNOSIS — R6889 Other general symptoms and signs: Secondary | ICD-10-CM | POA: Diagnosis not present

## 2022-10-01 DIAGNOSIS — R42 Dizziness and giddiness: Secondary | ICD-10-CM | POA: Diagnosis not present

## 2022-10-01 DIAGNOSIS — R079 Chest pain, unspecified: Secondary | ICD-10-CM | POA: Diagnosis present

## 2022-10-01 DIAGNOSIS — R0602 Shortness of breath: Secondary | ICD-10-CM | POA: Diagnosis not present

## 2022-10-01 DIAGNOSIS — Z7901 Long term (current) use of anticoagulants: Secondary | ICD-10-CM | POA: Diagnosis not present

## 2022-10-01 DIAGNOSIS — Z20822 Contact with and (suspected) exposure to covid-19: Secondary | ICD-10-CM | POA: Diagnosis not present

## 2022-10-01 DIAGNOSIS — Z743 Need for continuous supervision: Secondary | ICD-10-CM | POA: Diagnosis not present

## 2022-10-01 NOTE — ED Triage Notes (Signed)
Patient arrived stating after dinner she became dizzy and checked her blood pressure which was elevated. Symptoms resolved prior to arrival.

## 2022-10-02 ENCOUNTER — Emergency Department (HOSPITAL_COMMUNITY): Payer: Medicare Other

## 2022-10-02 DIAGNOSIS — R0602 Shortness of breath: Secondary | ICD-10-CM | POA: Diagnosis not present

## 2022-10-02 LAB — RESP PANEL BY RT-PCR (RSV, FLU A&B, COVID)  RVPGX2
Influenza A by PCR: NEGATIVE
Influenza B by PCR: NEGATIVE
Resp Syncytial Virus by PCR: NEGATIVE
SARS Coronavirus 2 by RT PCR: NEGATIVE

## 2022-10-02 LAB — CBC WITH DIFFERENTIAL/PLATELET
Abs Immature Granulocytes: 0.03 10*3/uL (ref 0.00–0.07)
Basophils Absolute: 0 10*3/uL (ref 0.0–0.1)
Basophils Relative: 0 %
Eosinophils Absolute: 0.1 10*3/uL (ref 0.0–0.5)
Eosinophils Relative: 1 %
HCT: 41.8 % (ref 36.0–46.0)
Hemoglobin: 13.8 g/dL (ref 12.0–15.0)
Immature Granulocytes: 0 %
Lymphocytes Relative: 12 %
Lymphs Abs: 1.1 10*3/uL (ref 0.7–4.0)
MCH: 30.9 pg (ref 26.0–34.0)
MCHC: 33 g/dL (ref 30.0–36.0)
MCV: 93.5 fL (ref 80.0–100.0)
Monocytes Absolute: 0.7 10*3/uL (ref 0.1–1.0)
Monocytes Relative: 8 %
Neutro Abs: 7.1 10*3/uL (ref 1.7–7.7)
Neutrophils Relative %: 79 %
Platelets: 237 10*3/uL (ref 150–400)
RBC: 4.47 MIL/uL (ref 3.87–5.11)
RDW: 15 % (ref 11.5–15.5)
WBC: 9 10*3/uL (ref 4.0–10.5)
nRBC: 0 % (ref 0.0–0.2)

## 2022-10-02 LAB — TROPONIN I (HIGH SENSITIVITY)
Troponin I (High Sensitivity): 5 ng/L (ref <18)
Troponin I (High Sensitivity): 6 ng/L (ref <18)

## 2022-10-02 LAB — BASIC METABOLIC PANEL
Anion gap: 10 (ref 5–15)
BUN: 18 mg/dL (ref 8–23)
CO2: 24 mmol/L (ref 22–32)
Calcium: 9.9 mg/dL (ref 8.9–10.3)
Chloride: 106 mmol/L (ref 98–111)
Creatinine, Ser: 0.77 mg/dL (ref 0.44–1.00)
GFR, Estimated: >60 mL/min (ref >60)
Glucose, Bld: 116 mg/dL — ABNORMAL HIGH (ref 70–99)
Potassium: 3.5 mmol/L (ref 3.5–5.1)
Sodium: 140 mmol/L (ref 135–145)

## 2022-10-02 NOTE — ED Provider Triage Note (Signed)
  Emergency Medicine Provider Triage Evaluation Note  MRN:  [id number]  Arrival date & time: 10/02/22    Medically screening exam initiated at 12:04 AM.   CC:   Hypertension   HPI:  An Lannan is a 76 y.o. year-old female presents to the ED with chief complaint of feeling like she had a panic attack.  States that her BP was high earlier today and has been up and down.  Reports intermittent dizziness.  States that she feels anxious and was having palpitations.  States that she is still having some palpitations and SOB.  History provided by patient. ROS:  -As included in HPI PE:   Vitals:   10/01/22 2319  BP: (!) 146/64  Pulse: 76  Resp: 16  Temp: 98.2 F (36.8 C)  SpO2: 95%    Non-toxic appearing No respiratory distress  MDM:  Based on signs and symptoms, ACS vs hypertensive urgency is highest on my differential, followed by panic attack. I've ordered labs and imaging in triage to expedite lab/diagnostic workup.  Patient was informed that the remainder of the evaluation will be completed by another provider, this initial triage assessment does not replace that evaluation, and the importance of remaining in the ED until their evaluation is complete.    Montine Circle, PA-C 10/02/22 0007

## 2022-10-02 NOTE — Discharge Instructions (Signed)
Try pepcid or tagamet up to twice a day.  Try to avoid things that may make this worse, most commonly these are spicy foods tomato based products fatty foods chocolate and peppermint.  Alcohol and tobacco can also make this worse.  Return to the emergency department for sudden worsening pain fever or inability to eat or drink.  

## 2022-10-02 NOTE — ED Provider Notes (Signed)
[facility] DEPT Provider Note   CSN: [id number] Arrival date & time: 10/01/22  2304     History  Chief Complaint  Patient presents with   Hypertension    Sheila Bruce is a 76 y.o. female.  76 yo F with a cc of chest pain dizziness.  This has been going on throughout the day yesterday.  She was getting the house ready for guests and was moving mattresses and making beds.  She would feel this off and on and then would resolve.  Has had no symptoms.  Not obviously exertional.  She checked her blood pressure multiple times when this occurred and it was elevated.  Eventually decided to come to the ED for evaluation.   Hypertension       Home Medications Prior to Admission medications   Medication Sig Start Date End Date Taking? Authorizing Provider  apixaban (ELIQUIS) 5 MG TABS tablet Take 1 tablet (5 mg total) by mouth 2 (two) times daily. 01/13/13   Hosie Poisson, MD  atorvastatin (LIPITOR) 80 MG tablet Take 1 tablet (80 mg total) by mouth daily. 03/16/22   Imogene Burn, PA-C  BIOTIN PO Take by mouth.    [provider]  Cholecalciferol (VITAMIN D3) 50 MCG (2000 UT) CAPS Take 1 capsule by mouth daily.    [provider]  diltiazem (CARDIZEM CD) 240 MG 24 hr capsule Take 240 mg by mouth daily. 03/09/17   [provider]  famotidine (PEPCID) 20 MG tablet 1 tablet at bedtime as needed 10/31/21   [provider]  fluticasone (FLONASE) 50 MCG/ACT nasal spray Place 2 sprays into the nose daily.    [provider]  ibuprofen (ADVIL) 200 MG tablet Take 200 mg by mouth as needed for pain.    [provider]  loratadine (CLARITIN) 10 MG tablet Take 10 mg by mouth daily.    [provider]  losartan (COZAAR) 100 MG tablet Take 100 mg by mouth daily.    [provider]  Multiple Vitamin (MULTIVITAMIN) tablet Take 1 tablet by mouth daily.    [provider]  Omega-3 Fatty Acids  (FISH OIL) 500 MG CAPS Take by mouth daily. Per patient taking 1200 mg everyday    [provider]  vitamin E 180 MG (400 UNITS) capsule 1 capsule    [provider]      Allergies    Ciprocinonide [fluocinolone], Codeine, Ciprofloxacin, and Doxycycline    Review of Systems   Review of Systems  Physical Exam Updated Vital Signs BP (!) 156/80 (BP Location: Right Arm)   Pulse 65   Temp 98.1 F (36.7 C) (Oral)   Resp 15   Ht 5' (1.524 m)   Wt 65.1 kg   SpO2 97%   BMI 28.03 kg/m  Physical Exam Vitals and nursing note reviewed.  Constitutional:      General: She is not in acute distress.    Appearance: She is well-developed. She is not diaphoretic.  HENT:     Head: Normocephalic and atraumatic.  Eyes:     Pupils: Pupils are equal, round, and reactive to light.  Cardiovascular:     Rate and Rhythm: Normal rate and regular rhythm.     Heart sounds: No murmur heard.    No friction rub. No gallop.  Pulmonary:     Effort: Pulmonary effort is normal.     Breath sounds: No wheezing or rales.  Abdominal:     General:  There is no distension.     Palpations: Abdomen is soft.     Tenderness: There is no abdominal tenderness.  Musculoskeletal:        General: No tenderness.     Cervical back: Normal range of motion and neck supple.  Skin:    General: Skin is warm and dry.  Neurological:     Mental Status: She is alert and oriented to person, place, and time.     Cranial Nerves: Cranial nerves 2-12 are intact.     Sensory: Sensation is intact.     Motor: Motor function is intact.     Coordination: Coordination is intact.     Gait: Gait is intact.     Comments: Ambulates without issue.  Benign neuro exam otherwise  Psychiatric:        Behavior: Behavior normal.     ED Results / Procedures / Treatments   Labs (all labs ordered are listed, but only abnormal results are displayed) Labs Reviewed  BASIC METABOLIC PANEL - Abnormal; Notable for the following  components:      Result Value   Glucose, Bld 116 (*)    All other components within normal limits  RESP PANEL BY RT-PCR (RSV, FLU A&B, COVID)  [id number]  CBC WITH DIFFERENTIAL/PLATELET  TROPONIN I (HIGH SENSITIVITY)  TROPONIN I (HIGH SENSITIVITY)    EKG None  Radiology DG Chest 2 View  Result Date: 10/02/2022 CLINICAL DATA:  Shortness of breath. EXAM: CHEST - 2 VIEW COMPARISON:  Chest radiograph dated 10/27/2021. FINDINGS: No focal consolidation, pleural effusion, or pneumothorax. The cardiac silhouette is within limits. Atherosclerotic calcification of the aorta. No acute osseous pathology. Degenerative changes of the spine. IMPRESSION: No active cardiopulmonary disease. Electronically Signed   By: Anner Crete M.D.   On: 10/02/2022 00:40    Procedures Procedures    Medications Ordered in ED Medications - No data to display  ED Course/ Medical Decision Making/ A&P                           Medical Decision Making  76 yo F with a chief complaints of episodic chest discomfort and dizziness.  This occurred off and on.  Eventually it got worse and worse and worse time occurred when she was sitting and watching TV.  She had something occur similarly about a year ago.  Since she has got here she has felt a bit better.  She unfortunately has had to wait quite a while and has been here for 23 hours.  She has a benign exam.  I think is unlikely that these were TIAs off and on.  She has 2 troponins that are negative.  He gets completely typical of pulmonary embolism and no further workup is warranted.  I discussed results with the patient.  She will do a trial of H2 blockers.  PCP follow-up.   10:04 PM:  I have discussed the diagnosis/risks/treatment options with the patient and family.  Evaluation and diagnostic testing in the emergency department does not suggest an emergent condition requiring admission or immediate intervention beyond what has been performed at this time.  They will  follow up with PCP. We also discussed returning to the ED immediately if new or worsening sx occur. We discussed the sx which are most concerning (e.g., sudden worsening pain, fever, inability to tolerate by mouth) that necessitate immediate return. Medications administered to the patient during their visit and any new prescriptions provided  to the patient are listed below.  Medications given during this visit Medications - No data to display   The patient appears reasonably screen and/or stabilized for discharge and I doubt any other medical condition or other [facility] requiring further screening, evaluation, or treatment in the ED at this time prior to discharge.          Final Clinical Impression(s) / ED Diagnoses Final diagnoses:  Nonspecific chest pain    Rx / DC Orders ED Discharge Orders     None         Deno Etienne, DO 10/02/22 2204

## 2022-10-03 ENCOUNTER — Telehealth: Payer: Self-pay | Admitting: Cardiovascular Disease

## 2022-10-03 NOTE — Telephone Encounter (Signed)
Pt would like a callback from nurse regarding recent ED visit. Please advise

## 2022-10-03 NOTE — Telephone Encounter (Signed)
Returned call to patient. She states she was just wanting to make [place] aware. No further complaints and no episodes since being seen at [facility] ED. She states that BP has been much better, checking daily before and after medication. Says she's going to see her MD next week and request something for anxiety.

## 2022-11-22 ENCOUNTER — Telehealth: Payer: Self-pay | Admitting: *Deleted

## 2022-11-22 ENCOUNTER — Other Ambulatory Visit (HOSPITAL_COMMUNITY): Payer: Self-pay | Admitting: Physician Assistant

## 2022-11-22 DIAGNOSIS — R1310 Dysphagia, unspecified: Secondary | ICD-10-CM

## 2022-11-22 DIAGNOSIS — K219 Gastro-esophageal reflux disease without esophagitis: Secondary | ICD-10-CM

## 2022-11-22 DIAGNOSIS — I480 Paroxysmal atrial fibrillation: Secondary | ICD-10-CM | POA: Diagnosis not present

## 2022-11-22 NOTE — Telephone Encounter (Signed)
   Pre-operative Risk Assessment    Patient Name: Sheila Bruce  DOB: 1946/05/19 MRN: [id number]      Request for Surgical Clearance    Procedure:   EGD  Date of Surgery:  Clearance 12/11/22                                 Surgeon:  DR. Randel Pigg Surgeon's Group or Practice Name:  [facility] Phone number:  [phone] Fax number:  [phone]   Type of Clearance Requested:   - Medical  - Pharmacy:  Hold Apixaban (Eliquis) x 2 DAYS    Type of Anesthesia:  Not Indicated (PROPOFOL?)   Additional requests/questions:    Jiles Prows   11/22/2022, 5:30 PM

## 2022-11-23 ENCOUNTER — Telehealth: Payer: Self-pay | Admitting: *Deleted

## 2022-11-23 NOTE — Telephone Encounter (Signed)
Patient with diagnosis of afib on Eliquis for anticoagulation.    Procedure: EGD Date of procedure: 12/11/22   CHA2DS2-VASc Score = 4   This indicates a 4.8% annual risk of stroke. The patient's score is based upon: CHF History: 0 HTN History: 1 Diabetes History: 0 Stroke History: 0 Vascular Disease History: 1 Age Score: 1 Gender Score: 1      CrCl 54 ml/min  Per office protocol, patient can hold Eliquis for 2 days prior to procedure.    **This guidance is not considered finalized until pre-operative APP has relayed final recommendations.**

## 2022-11-23 NOTE — Telephone Encounter (Signed)
Left message on machine for pt to contact the office.   

## 2022-11-23 NOTE — Telephone Encounter (Signed)
   Name: Sheila Bruce  DOB: March 11, 1946  MRN: [id number]  Primary Cardiologist: Sherren Mocha, MD   Preoperative team, please contact this patient and set up a phone call appointment for further preoperative risk assessment. Please obtain consent and complete medication review. Thank you for your help.  I confirm that guidance regarding antiplatelet and oral anticoagulation therapy has been completed and, if necessary, noted below.  Per office protocol, patient can hold Eliquis for 2 days prior to procedure.    Ledora Bottcher, PA 11/23/2022, 3:06 PM [facility]

## 2022-11-26 ENCOUNTER — Telehealth: Payer: Self-pay | Admitting: *Deleted

## 2022-11-26 NOTE — Telephone Encounter (Signed)
  Patient Consent for Virtual Visit         Sheila Bruce has provided verbal consent on 11/26/2022 for a virtual visit (video or telephone).   CONSENT FOR VIRTUAL VISIT FOR:  Sheila Bruce  By participating in this virtual visit I agree to the following:  I hereby voluntarily request, consent and authorize [facility] and its employed or contracted physicians, physician assistants, nurse practitioners or other licensed health care professionals (the Practitioner), to provide me with telemedicine health care services (the "Services") as deemed necessary by the treating Practitioner. I acknowledge and consent to receive the Services by the Practitioner via telemedicine. I understand that the telemedicine visit will involve communicating with the Practitioner through live audiovisual communication technology and the disclosure of certain medical information by electronic transmission. I acknowledge that I have been given the opportunity to request an in-person assessment or other available alternative prior to the telemedicine visit and am voluntarily participating in the telemedicine visit.  I understand that I have the right to withhold or withdraw my consent to the use of telemedicine in the course of my care at any time, without affecting my right to future care or treatment, and that the Practitioner or I may terminate the telemedicine visit at any time. I understand that I have the right to inspect all information obtained and/or recorded in the course of the telemedicine visit and may receive copies of available information for a reasonable fee.  I understand that some of the potential risks of receiving the Services via telemedicine include:  Delay or interruption in medical evaluation due to technological equipment failure or disruption; Information transmitted may not be sufficient (e.g. poor resolution of images) to allow for appropriate medical decision making by the  Practitioner; and/or  In rare instances, security protocols could fail, causing a breach of personal health information.  Furthermore, I acknowledge that it is my responsibility to provide information about my medical history, conditions and care that is complete and accurate to the best of my ability. I acknowledge that Practitioner's advice, recommendations, and/or decision may be based on factors not within their control, such as incomplete or inaccurate data provided by me or distortions of diagnostic images or specimens that may result from electronic transmissions. I understand that the practice of medicine is not an exact science and that Practitioner makes no warranties or guarantees regarding treatment outcomes. I acknowledge that a copy of this consent can be made available to me via my patient portal ([facility]), or I can request a printed copy by calling the office of [facility].    I understand that my insurance will be billed for this visit.   I have read or had this consent read to me. I understand the contents of this consent, which adequately explains the benefits and risks of the Services being provided via telemedicine.  I have been provided ample opportunity to ask questions regarding this consent and the Services and have had my questions answered to my satisfaction. I give my informed consent for the services to be provided through the use of telemedicine in my medical care

## 2022-11-26 NOTE — Telephone Encounter (Signed)
Pt returning call to set up tele visit

## 2022-11-29 ENCOUNTER — Ambulatory Visit: Payer: Medicare Other | Attending: Cardiology | Admitting: Nurse Practitioner

## 2022-11-29 DIAGNOSIS — Z01810 Encounter for preprocedural cardiovascular examination: Secondary | ICD-10-CM

## 2022-11-29 NOTE — Progress Notes (Signed)
Virtual Visit via Telephone Note   Because of Sheila Bruce's co-morbid illnesses, she is at least at moderate risk for complications without adequate follow up.  This format is felt to be most appropriate for this patient at this time.  The patient did not have access to video technology/had technical difficulties with video requiring transitioning to audio format only (telephone).  All issues noted in this document were discussed and addressed.  No physical exam could be performed with this format.  Please refer to the patient's chart for her consent to telehealth for [facility].  Evaluation Performed:  Preoperative cardiovascular risk assessment _____________   Date:  11/29/2022   Patient ID:  Sheila Bruce, Sheila Bruce Sheila Bruce, Sheila Bruce, MRN [id number] Patient Location:  Home Provider location:   Office  Primary Care Provider:  Kathyrn Lass, MD Primary Cardiologist:  Sherren Mocha, MD  Chief Complaint / Patient Profile   77 y.o. y/o female with a h/o paroxysmal atrial fibrillation on Eliquis, mild aortic sclerosis, hypertension, hyperlipidemia, and former tobacco use who is pending EGD on 12/11/2022 with Dr. Randel Pigg of [facility] and presents today for telephonic preoperative cardiovascular risk assessment.  History of Present Illness    Sheila Bruce is a 77 y.o. female who presents via audio/video conferencing for a telehealth visit today.  Pt was last seen in cardiology clinic on 09/11/2022 by Estella Husk, PA.  At that time Sheila Bruce was doing well.  The patient is now pending procedure as outlined above. Since her last visit, she has done well from a cardiac standpoint. She notes occasional brief palpitations.  She denies chest pain, dyspnea, pnd, orthopnea, n, v, dizziness, syncope, edema, weight gain, or early satiety. All other systems reviewed and are otherwise negative except as noted above.   Past Medical History    Past Medical History:  Diagnosis Date    Anxiety    Habitual alcohol use    Heart murmur    Hx of cardiovascular stress test    a. ETT-Myoview 5/14:  Normal; EF 79%, no ischemia, exercised 10:00.   Hyperlipidemia    Hypertension    PAF (paroxysmal atrial fibrillation) ([place])    a. Echo 4/14: Mild concentric LVH, EF 123456, grade 1 diastolic dysfunction, trivial MR, PASP 29;  b. Eliquis 5 bid   Vertigo    Past Surgical History:  Procedure Laterality Date   ABDOMINAL HYSTERECTOMY     BREAST EXCISIONAL BIOPSY     CYSTOSCOPY     Laproscopy-Abdominal     TENDON REPAIR     Left hand    Allergies  Allergies  Allergen Reactions   Ciprocinonide [Fluocinolone] Rash   Codeine Itching   Ciprofloxacin    Doxycycline Nausea And Vomiting    Home Medications    Prior to Admission medications   Medication Sig Start Date End Date Taking? Authorizing Provider  acetaminophen (TYLENOL 8 HOUR) 650 MG CR tablet Take 1,300 mg by mouth every 8 (eight) hours as needed for pain.    [provider]  apixaban (ELIQUIS) 5 MG TABS tablet Take 1 tablet (5 mg total) by mouth 2 (two) times daily. 01/13/13   Hosie Poisson, MD  atorvastatin (LIPITOR) 80 MG tablet Take 1 tablet (80 mg total) by mouth daily. 03/16/22   Imogene Burn, PA-C  BIOTIN PO Take by mouth.    [provider]  Cholecalciferol (VITAMIN D3) 50 MCG (2000 UT) CAPS Take 1 capsule by mouth daily.    [provider]  diltiazem (CARDIZEM CD) 240 MG 24 hr capsule Take 240 mg by mouth daily. 03/09/17   [provider]  famotidine (PEPCID) 20 MG tablet Take 20 mg by mouth daily. 10/31/21   [provider]  fluticasone (FLONASE) 50 MCG/ACT nasal spray Place 2 sprays into the nose daily.    [provider]  hydrochlorothiazide (HYDRODIURIL) 25 MG tablet Take 25 mg by mouth daily.    [provider]  hydrOXYzine (ATARAX) 10 MG tablet Take 10 mg by mouth every 6 (six) hours as needed for anxiety. 10/04/22   [provider]   loratadine (CLARITIN) 10 MG tablet Take 10 mg by mouth daily.    [provider]  losartan (COZAAR) 100 MG tablet Take 100 mg by mouth daily.    [provider]  Multiple Vitamin (MULTIVITAMIN) tablet Take 1 tablet by mouth daily.    [provider]  Omega-3 Fatty Acids (FISH OIL) 500 MG CAPS Take by mouth daily. Per patient taking 1200 mg everyday    [provider]  vitamin E 180 MG (400 UNITS) capsule 1 capsule    [provider]    Physical Exam    Vital Signs:  Sheila Bruce does not have vital signs available for review today.  Given telephonic nature of communication, physical exam is limited. AAOx3. NAD. Normal affect.  Speech and respirations are unlabored.  Accessory Clinical Findings    None  Assessment & Plan    1.  Preoperative Cardiovascular Risk Assessment:  According to the Revised Cardiac Risk Index (RCRI), her Perioperative Risk of Major Cardiac Event is (%): 0.4. Her Functional Capacity in METs is: 5.62 according to the Duke Activity Status Index (DASI). Therefore, based on ACC/AHA guidelines, patient would be at acceptable risk for the planned procedure without further cardiovascular testing.  The patient was advised that if she develops new symptoms prior to surgery to contact our office to arrange for a follow-up visit, and she verbalized understanding.  Per office protocol, patient can hold Eliquis for 2 days prior to procedure. Please resume Eliquis as soon as possible postprocedure, at the discretion of the surgeon.    A copy of this note will be routed to requesting surgeon.  Time:   Today, I have spent 7 minutes with the patient with telehealth technology discussing medical history, symptoms, and management plan.     Lenna Sciara, NP  11/29/2022, 10:31 AM

## 2022-12-03 ENCOUNTER — Encounter (HOSPITAL_COMMUNITY): Payer: Self-pay | Admitting: Emergency Medicine

## 2022-12-03 ENCOUNTER — Observation Stay (HOSPITAL_COMMUNITY)
Admission: EM | Admit: 2022-12-03 | Discharge: 2022-12-04 | Disposition: A | Discharge status: Home / Self Care | Payer: Medicare Other | Attending: Internal Medicine | Admitting: Internal Medicine

## 2022-12-03 ENCOUNTER — Other Ambulatory Visit (HOSPITAL_COMMUNITY): Payer: Medicare Other

## 2022-12-03 ENCOUNTER — Other Ambulatory Visit: Payer: Self-pay

## 2022-12-03 DIAGNOSIS — I2510 Atherosclerotic heart disease of native coronary artery without angina pectoris: Secondary | ICD-10-CM | POA: Insufficient documentation

## 2022-12-03 DIAGNOSIS — Z87891 Personal history of nicotine dependence: Secondary | ICD-10-CM | POA: Diagnosis not present

## 2022-12-03 DIAGNOSIS — R079 Chest pain, unspecified: Secondary | ICD-10-CM | POA: Diagnosis present

## 2022-12-03 DIAGNOSIS — I480 Paroxysmal atrial fibrillation: Principal | ICD-10-CM | POA: Diagnosis present

## 2022-12-03 DIAGNOSIS — E876 Hypokalemia: Secondary | ICD-10-CM | POA: Diagnosis not present

## 2022-12-03 DIAGNOSIS — Z79899 Other long term (current) drug therapy: Secondary | ICD-10-CM | POA: Diagnosis not present

## 2022-12-03 DIAGNOSIS — I10 Essential (primary) hypertension: Secondary | ICD-10-CM | POA: Diagnosis present

## 2022-12-03 DIAGNOSIS — K219 Gastro-esophageal reflux disease without esophagitis: Secondary | ICD-10-CM | POA: Diagnosis present

## 2022-12-03 DIAGNOSIS — Z7901 Long term (current) use of anticoagulants: Secondary | ICD-10-CM | POA: Insufficient documentation

## 2022-12-03 DIAGNOSIS — I4891 Unspecified atrial fibrillation: Secondary | ICD-10-CM | POA: Diagnosis not present

## 2022-12-03 DIAGNOSIS — R1310 Dysphagia, unspecified: Secondary | ICD-10-CM | POA: Insufficient documentation

## 2022-12-03 DIAGNOSIS — E785 Hyperlipidemia, unspecified: Secondary | ICD-10-CM | POA: Diagnosis present

## 2022-12-03 LAB — CBC
HCT: 46.4 % — ABNORMAL HIGH (ref 36.0–46.0)
Hemoglobin: 15.3 g/dL — ABNORMAL HIGH (ref 12.0–15.0)
MCH: 30.5 pg (ref 26.0–34.0)
MCHC: 33 g/dL (ref 30.0–36.0)
MCV: 92.4 fL (ref 80.0–100.0)
Platelets: 260 10*3/uL (ref 150–400)
RBC: 5.02 MIL/uL (ref 3.87–5.11)
RDW: 15 % (ref 11.5–15.5)
WBC: 7.1 10*3/uL (ref 4.0–10.5)
nRBC: 0 % (ref 0.0–0.2)

## 2022-12-03 LAB — BASIC METABOLIC PANEL
Anion gap: 16 — ABNORMAL HIGH (ref 5–15)
BUN: 28 mg/dL — ABNORMAL HIGH (ref 8–23)
CO2: 24 mmol/L (ref 22–32)
Calcium: 9.4 mg/dL (ref 8.9–10.3)
Chloride: 101 mmol/L (ref 98–111)
Creatinine, Ser: 0.91 mg/dL (ref 0.44–1.00)
GFR, Estimated: >60 mL/min (ref >60)
Glucose, Bld: 128 mg/dL — ABNORMAL HIGH (ref 70–99)
Potassium: 3.3 mmol/L — ABNORMAL LOW (ref 3.5–5.1)
Sodium: 141 mmol/L (ref 135–145)

## 2022-12-03 LAB — TSH
TSH: 0.61 u[IU]/mL (ref 0.350–4.500)
TSH: 0.553 u[IU]/mL (ref 0.350–4.500)

## 2022-12-03 LAB — MAGNESIUM: Magnesium: 2.4 mg/dL (ref 1.7–2.4)

## 2022-12-03 MED ORDER — ONDANSETRON HCL 4 MG PO TABS: 4.0000 mg | ORAL_TABLET | Freq: Four times a day (QID) | ORAL | Status: DC | PRN

## 2022-12-03 MED ORDER — ONDANSETRON HCL 4 MG/2ML IJ SOLN
4.0000 mg | Freq: Four times a day (QID) | INTRAMUSCULAR | Status: DC | PRN
  Filled 2022-12-03: qty 2

## 2022-12-03 MED ORDER — ACETAMINOPHEN 325 MG PO TABS: 650.0000 mg | ORAL_TABLET | Freq: Four times a day (QID) | ORAL | Status: DC | PRN

## 2022-12-03 MED ORDER — METOPROLOL TARTRATE 25 MG PO TABS
25.0000 mg | ORAL_TABLET | Freq: Two times a day (BID) | ORAL | Status: DC
  Administered 2022-12-03 – 2022-12-04 (×3): 25 mg via ORAL
  Filled 2022-12-03 (×3): qty 1

## 2022-12-03 MED ORDER — ORAL CARE MOUTH RINSE: 15.0000 mL | OROMUCOSAL | Status: DC | PRN

## 2022-12-03 MED ORDER — DILTIAZEM HCL ER COATED BEADS 240 MG PO CP24
240.0000 mg | ORAL_CAPSULE | Freq: Every day | ORAL | Status: DC
  Administered 2022-12-03 – 2022-12-04 (×2): 240 mg via ORAL
  Filled 2022-12-03: qty 1
  Filled 2022-12-03: qty 2

## 2022-12-03 MED ORDER — DILTIAZEM HCL ER COATED BEADS 120 MG PO CP24: 240.0000 mg | ORAL_CAPSULE | Freq: Every day | ORAL | Status: DC

## 2022-12-03 MED ORDER — FAMOTIDINE 20 MG PO TABS
20.0000 mg | ORAL_TABLET | Freq: Every day | ORAL | Status: DC
  Administered 2022-12-03 – 2022-12-04 (×2): 20 mg via ORAL
  Filled 2022-12-03 (×2): qty 1

## 2022-12-03 MED ORDER — POTASSIUM CHLORIDE CRYS ER 20 MEQ PO TBCR
40.0000 meq | EXTENDED_RELEASE_TABLET | Freq: Every day | ORAL | Status: AC
  Filled 2022-12-03: qty 2

## 2022-12-03 MED ORDER — DILTIAZEM LOAD VIA INFUSION
20.0000 mg | Freq: Once | INTRAVENOUS | Status: AC
  Administered 2022-12-03: 20 mg via INTRAVENOUS
  Filled 2022-12-03: qty 20

## 2022-12-03 MED ORDER — ATORVASTATIN CALCIUM 40 MG PO TABS
80.0000 mg | ORAL_TABLET | Freq: Every day | ORAL | Status: DC
  Administered 2022-12-03: 80 mg via ORAL
  Filled 2022-12-03: qty 2

## 2022-12-03 MED ORDER — ACETAMINOPHEN 650 MG RE SUPP: 650.0000 mg | Freq: Four times a day (QID) | RECTAL | Status: DC | PRN

## 2022-12-03 MED ORDER — DILTIAZEM HCL-DEXTROSE 125-5 MG/125ML-% IV SOLN (PREMIX)
5.0000 mg/h | INTRAVENOUS | Status: DC
  Administered 2022-12-03: 5 mg/h via INTRAVENOUS
  Filled 2022-12-03 (×2): qty 125

## 2022-12-03 MED ORDER — APIXABAN 5 MG PO TABS
5.0000 mg | ORAL_TABLET | Freq: Two times a day (BID) | ORAL | Status: DC
  Administered 2022-12-03 – 2022-12-04 (×2): 5 mg via ORAL
  Filled 2022-12-03 (×2): qty 1

## 2022-12-03 MED ORDER — POTASSIUM CHLORIDE CRYS ER 20 MEQ PO TBCR
40.0000 meq | EXTENDED_RELEASE_TABLET | Freq: Once | ORAL | Status: AC
  Administered 2022-12-03: 40 meq via ORAL
  Filled 2022-12-03: qty 2

## 2022-12-03 NOTE — ED Notes (Signed)
Pt ambulated to bathroom with steady gait. 

## 2022-12-03 NOTE — H&P (Signed)
History and Physical    Patient: Sheila Bruce [id number] DOB: 13-Aug-1946 DOA: 12/03/2022 DOS: the patient was seen and examined on 12/03/2022 PCP: Kathyrn Lass, MD  Patient coming from: Home  Chief Complaint:  Chief Complaint  Patient presents with   Chest Pain   HPI: Sheila Bruce is a 77 y.o. 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  Pressures not radiated, nausea, emesis or diaphoresis.  No fever, chills or night sweats. No sore throat, rhinorrhea, dyspnea, wheezing or hemoptysis.  No chest pain, palpitations, diaphoresis, PND, orthopnea or pitting edema of the lower extremities.  No appetite changes, abdominal pain, diarrhea, constipation, melena or hematochezia.  No flank pain, dysuria, frequency or hematuria.  No polyuria, polydipsia, polyphagia or blurred vision.  Lab work: CBC showed white count 7.1, hemoglobin 15.3 g/dL platelets 260.  Magnesium 2.4 mg/dL.  BMP showed a potassium 3.3 mmol/L, glucose 128 and BUN 29 mg/dL.  Creatinine and the rest of the electrolytes were normal.  ED course: Initial vital signs were temperature 98.2 F, pulse 150, respirations 22, BP 161/98 mmHg O2 sat 96% on room air.  The patient received KCl 40 Meq p.o. x 1 and was started on a continuous diltiazem infusion.   Review of Systems: As mentioned in the history of present illness. All other systems reviewed and are negative. Past Medical History:  Diagnosis Date   Anxiety    Habitual alcohol use    Heart murmur    Hx of cardiovascular  stress test    a. ETT-Myoview 5/14:  Normal; EF 79%, no ischemia, exercised 10:00.   Hyperlipidemia    Hypertension    PAF (paroxysmal atrial fibrillation) ([place])    a. Echo 4/14: Mild concentric LVH, EF 123456, grade 1 diastolic dysfunction, trivial MR, PASP 29;  b. Eliquis 5 bid   Vertigo    Past Surgical History:  Procedure Laterality Date   ABDOMINAL HYSTERECTOMY     BREAST EXCISIONAL BIOPSY     CYSTOSCOPY     Laproscopy-Abdominal     TENDON REPAIR     Left hand   Social History:  reports that she quit smoking about 17 years ago. Her smoking use included cigarettes. She has never used smokeless tobacco. She reports that she does not currently use alcohol after a past usage of about 1.0 standard drink of alcohol per week. She reports that she does not use drugs.  Allergies  Allergen Reactions   Ciprocinonide [Fluocinolone] Rash   Codeine Itching   Ciprofloxacin    Doxycycline Nausea And Vomiting    Family History  Problem Relation Age of Onset   Peripheral vascular disease Mother    Heart disease Father 66       PTCA   High Cholesterol Father    Breast cancer Maternal Grandmother    Breast cancer Paternal Grandmother    Hypertension Other        Mother & father   Hyperlipidemia Other        Mother & father   Autoimmune disease Other        Mother (?liver) and sister, type unclear   CAD Other  father had old MI on EKG   CAD Other        multiple grandparents    Prior to Admission medications   Medication Sig Start Date End Date Taking? Authorizing Provider  acetaminophen (TYLENOL 8 HOUR) 650 MG CR tablet Take 1,300 mg by mouth every 8 (eight) hours as needed for pain.    [provider]  apixaban (ELIQUIS) 5 MG TABS tablet Take 1 tablet (5 mg total) by mouth 2 (two) times daily. 01/13/13   Hosie Poisson, MD  atorvastatin (LIPITOR) 80 MG tablet Take 1 tablet (80 mg total) by mouth daily. 03/16/22   Imogene Burn, PA-C  BIOTIN PO Take by mouth.     [provider]  Cholecalciferol (VITAMIN D3) 50 MCG (2000 UT) CAPS Take 1 capsule by mouth daily.    [provider]  diltiazem (CARDIZEM CD) 240 MG 24 hr capsule Take 240 mg by mouth daily. 03/09/17   [provider]  famotidine (PEPCID) 20 MG tablet Take 20 mg by mouth daily. 10/31/21   [provider]  fluticasone (FLONASE) 50 MCG/ACT nasal spray Place 2 sprays into the nose daily.    [provider]  hydrochlorothiazide (HYDRODIURIL) 25 MG tablet Take 25 mg by mouth daily.    [provider]  hydrOXYzine (ATARAX) 10 MG tablet Take 10 mg by mouth every 6 (six) hours as needed for anxiety. 10/04/22   [provider]  loratadine (CLARITIN) 10 MG tablet Take 10 mg by mouth daily.    [provider]  losartan (COZAAR) 100 MG tablet Take 100 mg by mouth daily.    [provider]  Multiple Vitamin (MULTIVITAMIN) tablet Take 1 tablet by mouth daily.    [provider]  Omega-3 Fatty Acids (FISH OIL) 500 MG CAPS Take by mouth daily. Per patient taking 1200 mg everyday    [provider]  vitamin E 180 MG (400 UNITS) capsule 1 capsule    [provider]    Physical Exam: Vitals:   12/03/22 0737 12/03/22 0800 12/03/22 0830 12/03/22 0900  BP: (!) 161/98 133/73 108/70 114/75  Pulse: (!) 151 (!) 133 (!) 105 95  Resp: (!) '21 18 12 '$ (!) 21  Temp: 98.2 F (36.8 C)     TempSrc: Oral     SpO2: 96% 96% 93% 95%  Weight:      Height:       Physical Exam Vitals and nursing note reviewed.  Constitutional:      General: She is awake. She is not in acute distress.    Appearance: She is well-developed.  HENT:     Head: Normocephalic.     Nose: No rhinorrhea.     Mouth/Throat:     Mouth: Mucous membranes are moist.  Eyes:     General: No scleral icterus.    Pupils: Pupils are equal, round, and reactive to light.  Neck:     Vascular: No JVD.  Cardiovascular:     Rate and Rhythm: Tachycardia  present. Rhythm irregular.     Heart sounds: S1 normal and S2 normal.  Pulmonary:     Breath sounds: No wheezing, rhonchi or rales.  Abdominal:     General: Bowel sounds are normal. There is no distension.     Palpations: Abdomen is soft.     Tenderness: There is no abdominal tenderness. There is no guarding.  Musculoskeletal:     Cervical back: Neck supple.     Right lower leg:  No edema.     Left lower leg: No edema.  Skin:    General: Skin is warm and dry.  Neurological:     General: No focal deficit present.     Mental Status: She is alert and oriented to person, place, and time.  Psychiatric:        Mood and Affect: Mood normal.        Behavior: Behavior normal. Behavior is cooperative.    Data Reviewed:  Results are pending, will review when available.  08/21/2021 Echocardiogram IMPRESSIONS    1. Left ventricular ejection fraction, by estimation, is 60 to 65%. The  left ventricle has normal function. The left ventricle has no regional  wall motion abnormalities. There is mild left ventricular hypertrophy.  Left ventricular diastolic parameters  are consistent with Grade I diastolic dysfunction (impaired relaxation).   2. Right ventricular systolic function is normal. The right ventricular  size is normal. There is normal pulmonary artery systolic pressure. The  estimated right ventricular systolic pressure is 0000000 mmHg.   3. The mitral valve is abnormal. Trivial mitral valve regurgitation.   4. The aortic valve was not well visualized. Aortic valve regurgitation  is not visualized. No aortic stenosis is present. Aortic valve mean  gradient measures 10.0 mmHg.   5. The inferior vena cava is normal in size with greater than 50%  respiratory variability, suggesting right atrial pressure of 3 mmHg.   Assessment and Plan: Principal Problem:   PAF (paroxysmal atrial fibrillation) ([place]) Presenting with:   Atrial fibrillation with RVR ([place]) She has converted back to  NSR. CHA2DS2-VASc Score = 5 Observation/PCU. Continue DOAC. Taper off diltiazem infusion. Resume Cardizem CD2 140 mg p.o. daily Cardiology has been consulted and: -They have started  metoprolol 25 mg p.o. twice daily. Keep electrolytes optimized. Check TSH level. Check echocardiogram. Wine drinking cessation advised. Advised to taper off nicotine chewing gum. Cardiology consult appreciated.  Active Problems:   Hypertension Continue Cardizem CD 240 mg p.o. daily. Started on metoprolol tartrate 25 mg p.o. twice daily. Resume HCTZ and losartan once IV of diltiazem and taper off. Monitor blood pressure, heart rate, renal function and electrolytes closely May need to decrease the dose of diuretics since metoprolol started.    Hyperlipidemia Continue Lipitor 80 mg p.o. daily.    Gastroesophageal reflux disease Continue famotidine 20 mg p.o. daily.    Hypokalemia Replacement ordered. Follow-up potassium level.     Advance Care Planning:   Code Status: Full Code   Consults: Cardiology Dorris Carnes, MD).  Family Communication: Her husband was at bedside.  Severity of Illness: The appropriate patient status for this patient is OBSERVATION. Observation status is judged to be reasonable and necessary in order to provide the required intensity of service to ensure the patient's safety. The patient's presenting symptoms, physical exam findings, and initial radiographic and laboratory data in the context of their medical condition is felt to place them at decreased risk for further clinical deterioration. Furthermore, it is anticipated that the patient will be medically stable for discharge from the hospital within 2 midnights of admission.   Author: Reubin Milan, MD 12/03/2022 9:26 AM  For on call review [email or web address].   This document was prepared using Dragon voice recognition software and may contain some unintended transcription errors.

## 2022-12-03 NOTE — ED Notes (Signed)
Pt sitting up talking on phone. Pt ate 1 [place] sandwich. Denies any other needs. Call bell in reach.

## 2022-12-03 NOTE — ED Triage Notes (Signed)
Patient arrives ambulatory by POV c/o irregular heart rate and rapid heart rate when waking this morning. Patient reports hx of A-fib and is on eliquis. Did not take dose this morning. Reports a heaviness to lower chest with mild shortness of breath. Patient speaking in full sentences.

## 2022-12-03 NOTE — Consult Note (Addendum)
Cardiology Consultation   Patient ID: Sheila Bruce MRN: [id number]; DOB: Sep 13, 1946  Admit date: 12/03/2022 Date of Consult: 12/03/2022  PCP:  Kathyrn Lass, MD   [facility] Cardiologist:  Sherren Mocha, MD   {  Patient Profile:   Sheila Bruce is a 77 y.o. female with a history of non-obstructive CAD on coronary CTA in 12/2021, paroxysmal atrial fibrillation on Eliquis, hypertension, hyperlipidemia, EOTH abuse, and prior tobacco abuse who is being seen 12/03/2022 for the evaluation of atrial fibrillation at the request of Dr. Olevia Bowens.  History of Present Illness:   Sheila Bruce is a 77 year old female with the above history who is followed by Dr. Burt Knack. Last Echo in 08/2021 showed LVEF of 60-65% with no regional wall motion abnormalities and grade 1 diastolic dysfunction. CTA in 12/2021 for further evaluation of chest pain showed coronary calcium score of 207 (76th percentile for age and sex) and mild non-obstructive CAD.   Patient was last seen by Ermalinda Barrios, PA-C, in 09/2022 at which time she was doing well from a cardiac standpoint. She reported occasional palpitations lasting 10-15 seconds but no associated symptoms with this. She was staying active by gardening, walking her dog, and golfing and denied any chest pain or shortness of breath with these activities. Her PCP had recently stopped her HCTZ due to low BP. However, this has since been restarted.  Patient presented to the [facility] ED today for further evaluation of palpitations. Upon arrival to the ED, patient was tachycardic and hypertensive. EKG showed atrial fibrillation, rate 141 bpm, with no acute ST/ T changes. WBC 7.1, Hgb 15.3, Plts 260. Na 141, K 3.3, Glucose 128, BUN 28, 0.91. Anion gap 16. Magnesium 2.4. Patient was admitted and started on IV Diltiazem. Cardiology consulted for further evaluation.   Patient converted back to sinus rhythm on IV Cardizem.  Patient was in her usual state of  health till this morning and after waking up she noted palpitations that she describes as her heart beating fast and irregularly.  Denies any associated symptoms with this.  No recent fevers or illnesses medication changes.  She denies any chest pain, shortness of breath, orthopnea, PND, lower extremity edema.  She occasionally has some very brief episodes of dizziness that sound like vertigo which she has a history of but no prolonged/sustained dizziness.  No falls or syncope.  She does report a cough in the mornings for a while now as well as coughing after eating and is having a GI workup. She is compliant with her Eliquis and denies any abnormal bleeding in urine or stools on this.  She is a former smoker but quit in 2006. She also has a history of alcohol abuse. She stopped drinking for a while but started drinking again this past Thanksgiving. She has been drinking more the last month - she states she drinks 4 large glasses of wine every night.  Past Medical History:  Diagnosis Date   Anxiety    Habitual alcohol use    Heart murmur    Hx of cardiovascular stress test    a. ETT-Myoview 5/14:  Normal; EF 79%, no ischemia, exercised 10:00.   Hyperlipidemia    Hypertension    PAF (paroxysmal atrial fibrillation) ([place])    a. Echo 4/14: Mild concentric LVH, EF 123456, grade 1 diastolic dysfunction, trivial MR, PASP 29;  b. Eliquis 5 bid   Vertigo     Past Surgical History:  Procedure Laterality Date  ABDOMINAL HYSTERECTOMY     BREAST EXCISIONAL BIOPSY     CYSTOSCOPY     Laproscopy-Abdominal     TENDON REPAIR     Left hand     Home Medications:  Prior to Admission medications   Medication Sig Start Date End Date Taking? Authorizing Provider  acetaminophen (TYLENOL 8 HOUR) 650 MG CR tablet Take 1,300 mg by mouth every 8 (eight) hours as needed for pain.    [provider]  apixaban (ELIQUIS) 5 MG TABS tablet Take 1 tablet (5 mg total) by mouth 2 (two) times daily. 01/13/13    Hosie Poisson, MD  atorvastatin (LIPITOR) 80 MG tablet Take 1 tablet (80 mg total) by mouth daily. 03/16/22   Imogene Burn, PA-C  BIOTIN PO Take by mouth.    [provider]  Cholecalciferol (VITAMIN D3) 50 MCG (2000 UT) CAPS Take 1 capsule by mouth daily.    [provider]  diltiazem (CARDIZEM CD) 240 MG 24 hr capsule Take 240 mg by mouth daily. 03/09/17   [provider]  famotidine (PEPCID) 20 MG tablet Take 20 mg by mouth daily. 10/31/21   [provider]  fluticasone (FLONASE) 50 MCG/ACT nasal spray Place 2 sprays into the nose daily.    [provider]  hydrochlorothiazide (HYDRODIURIL) 25 MG tablet Take 25 mg by mouth daily.    [provider]  hydrOXYzine (ATARAX) 10 MG tablet Take 10 mg by mouth every 6 (six) hours as needed for anxiety. 10/04/22   [provider]  loratadine (CLARITIN) 10 MG tablet Take 10 mg by mouth daily.    [provider]  losartan (COZAAR) 100 MG tablet Take 100 mg by mouth daily.    [provider]  Multiple Vitamin (MULTIVITAMIN) tablet Take 1 tablet by mouth daily.    [provider]  Omega-3 Fatty Acids (FISH OIL) 500 MG CAPS Take by mouth daily. Per patient taking 1200 mg everyday    [provider]  vitamin E 180 MG (400 UNITS) capsule 1 capsule    [provider]    Inpatient Medications: Scheduled Meds:  potassium chloride  40 mEq Oral Q lunch   Continuous Infusions:  diltiazem (CARDIZEM) infusion 5 mg/hr (12/03/22 0758)   PRN Meds: acetaminophen **OR** acetaminophen, ondansetron **OR** ondansetron (ZOFRAN) IV  Allergies:    Allergies  Allergen Reactions   Ciprocinonide [Fluocinolone] Rash   Codeine Itching   Ciprofloxacin    Doxycycline Nausea And Vomiting    Social History:   Social History   Socioeconomic History   Marital status: Married    Spouse name: Not on file   Number of children: Not on file   Years of education: Not  on file   Highest education level: Not on file  Occupational History   Not on file  Tobacco Use   Smoking status: Former    Types: Cigarettes    Quit date: 01/12/2005    Years since quitting: 17.9   Smokeless tobacco: Never   Tobacco comments:    Smoked for 35 yrs - As of 08/2015  chews nicotine gum  Substance and Sexual Activity   Alcohol use: Not Currently    Alcohol/week: 1.0 standard drink of alcohol    Types: 1 Glasses of wine per week    Comment: Occasional use now - less than one per day during week, Occasional 2-3 on the weekend.   Drug use: No   Sexual activity: Never  Other Topics Concern  Not on file  Social History Narrative   Not on file   Social Determinants of Health   Financial Resource Strain: Not on file  Food Insecurity: Not on file  Transportation Needs: Not on file  Physical Activity: Not on file  Stress: Not on file  Social Connections: Not on file  Intimate Partner Violence: Not on file    Family History:   Family History  Problem Relation Age of Onset   Peripheral vascular disease Mother    Heart disease Father 20       PTCA   High Cholesterol Father    Breast cancer Maternal Grandmother    Breast cancer Paternal Grandmother    Hypertension Other        Mother & father   Hyperlipidemia Other        Mother & father   Autoimmune disease Other        Mother (?liver) and sister, type unclear   CAD Other        father had old MI on EKG   CAD Other        multiple grandparents     ROS:  Please see the history of present illness.  Review of Systems  Constitutional:  Negative for fever and malaise/fatigue.  HENT:  Negative for congestion.   Respiratory:  Positive for cough. Negative for shortness of breath.   Cardiovascular:  Positive for palpitations. Negative for chest pain, orthopnea, leg swelling and PND.  Gastrointestinal:  Negative for blood in stool, diarrhea, melena, nausea and vomiting.  Genitourinary:  Negative for hematuria.   Musculoskeletal:  Negative for falls.  Neurological:  Positive for dizziness. Negative for loss of consciousness. Speech change: alcohol abuse. Endo/Heme/Allergies:  Does not bruise/bleed easily.  Psychiatric/Behavioral:  Positive for substance abuse.     Physical Exam/Data:   Vitals:   12/03/22 0737 12/03/22 0800 12/03/22 0830 12/03/22 0900  BP: (!) 161/98 133/73 108/70 114/75  Pulse: (!) 151 (!) 133 (!) 105 95  Resp: (!) '21 18 12 '$ (!) 21  Temp: 98.2 F (36.8 C)     TempSrc: Oral     SpO2: 96% 96% 93% 95%  Weight:      Height:       No intake or output data in the 24 hours ending 12/03/22 1018    12/03/2022    7:34 AM 10/02/2022   10:01 PM 09/11/2022   10:31 AM  Last 3 Weights  Weight (lbs) 142 lb 143 lb 8.3 oz 143 lb 9.6 oz  Weight (kg) 64.411 kg 65.1 kg 65.137 kg     Body mass index is 27.73 kg/m.  General: 77 y.o. Caucasian female resting comfortably in no acute distress. HEENT: Normocephalic and atraumatic. Sclera clear.  Neck: Supple. No carotid bruits. No JVD. Heart: RRR. Distinct S1 and S2. Soft systolic murmur.  No gallops or rubs. Radial and distal pedal pulses 2+ and equal bilaterally. Lungs: No increased work of breathing. Clear to ausculation bilaterally. No wheezes, rhonchi, or rales.  Abdomen: Soft, non-distended, and non-tender to palpation.  MSK: Normal strength and tone for age. Extremities: No lower extremity edema.    Skin: Warm and dry. Neuro: Alert and oriented x3. No focal deficits. Psych: Normal affect. Responds appropriately.  EKG:  The EKG was personally reviewed and demonstrates:  Atrial fibrillation, rate 141 bpm, with no acute ST/ T changes.  Telemetry:  Telemetry was personally reviewed and demonstrates:  Initially in atrial fibrillation with rates in the 140s-150s but now back  in sinus rhythm with rates in the 80s.  Relevant CV Studies:  Echocardiogram 08/21/2021: 1. Left ventricular ejection fraction, by estimation, is 60 to 65%. The   left ventricle has normal function. The left ventricle has no regional  wall motion abnormalities. There is mild left ventricular hypertrophy.  Left ventricular diastolic parameters  are consistent with Grade I diastolic dysfunction (impaired relaxation).   2. Right ventricular systolic function is normal. The right ventricular  size is normal. There is normal pulmonary artery systolic pressure. The  estimated right ventricular systolic pressure is 0000000 mmHg.   3. The mitral valve is abnormal. Trivial mitral valve regurgitation.   4. The aortic valve was not well visualized. Aortic valve regurgitation  is not visualized. No aortic stenosis is present. Aortic valve mean  gradient measures 10.0 mmHg.   5. The inferior vena cava is normal in size with greater than 50%  respiratory variability, suggesting right atrial pressure of 3 mmHg.   Comparison(s): Prior images unable to be directly viewed, comparison made  by report only. Changes from prior study are noted. 05/31/2017: LVEF  123456, normal diastolic function.  _______________  Coronary CTA 12/11/2021: Impressions: 1. 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 2. Coronary calcium score of 207. This was 76th percentile for age and sex matched control. 3. Normal coronary origin with right dominance. 4. Aortic atherosclerosis. 5. Aggressive cardiovascular risk factor modification is recommended.   Laboratory Data:  High Sensitivity Troponin:  No results for input(s): "TROPONINIHS" in the last 720 hours.   Chemistry Recent Labs  Lab 12/03/22 0752  NA 141  K 3.3*  CL 101  CO2 24  GLUCOSE 128*  BUN 28*  CREATININE 0.91  CALCIUM 9.4  MG 2.4  GFRNONAA >60  ANIONGAP 16*    No results for input(s): "PROT", "ALBUMIN", "AST", "ALT", "ALKPHOS", "BILITOT" in the last 168 hours. Lipids No results for input(s): "CHOL", "TRIG", "HDL", "LABVLDL", "LDLCALC", "CHOLHDL" in the last 168 hours.  Hematology Recent Labs  Lab  12/03/22 0752  WBC 7.1  RBC 5.02  HGB 15.3*  HCT 46.4*  MCV 92.4  MCH 30.5  MCHC 33.0  RDW 15.0  PLT 260   Thyroid No results for input(s): "TSH", "FREET4" in the last 168 hours.  BNPNo results for input(s): "BNP", "PROBNP" in the last 168 hours.  DDimer No results for input(s): "DDIMER" in the last 168 hours.   Radiology/Studies:  No results found.   Assessment and Plan:   Paroxysmal Atrial Fibrillation History of paroxysmal atrial fibrillation. She presented with tachypalpitations and was noted to be back in atrial fibrillation with rates in the 140s to 150s. Echo in 08/2021 showed normal LV function. She was started on IV Diltiazem and is now back in sinus rhythm.  - Currently in sinus rhythm with rates in the 80s.  - Repeat Echo already ordered. - Potassium 3.3. Repleted by primary team. - Magnesium 2.4.  - Will check TSH. - Currently on IV Diltiazem. Will wean off and transition back to PO Diltiazem '240mg'$  which she takes at home. - Will start Lopressor '25mg'$  twice daily.  - Continue chronic anticoagulation with Eliquis '5mg'$  twice daily. - Patient has a history of ETOH and starting drinking again this past Thanksgiving. For the last month, she reports she has been drinking 4 large glasses wine every night. This may of contributed to recurrent atrial fibrillation. Patient understands the importance of cessation and states she is going to stop completely.  Non-Obstructive CAD  Noted on coronary CTA in 12/2021. - No angina.  - No aspirin due to need for DOAC. - Continue high-intensity statin.  Hypertension BP initially 161/98 but improved on IV Cardizem. - Can wean off IV Diltiazem and transition back to PO Diltiazem '240mg'$  daily. - Will start Lopressor '25mg'$  twice daily. - Can restart home HCTZ and Losartan after she has been weaned off IV Cardizem.  Hyperlipidemia - Continue Lipitor '80mg'$  daily.   Risk Assessment/Risk Scores:    CHA2DS2-VASc Score = 4  This  indicates a 4.8% annual risk of stroke. The patient's score is based upon: CHF History: 0 HTN History: 1 Diabetes History: 0 Stroke History: 0 Vascular Disease History: 1 Age Score: 1 Gender Score: 1   For questions or updates, please contact [place] Please consult [email or web address] for contact info under    Signed, Eppie Gibson  12/03/2022 10:18 AM  Patient seen and examined   I agree with findings as noted by Virgie Dad above Pt is a 60 yo with hx of mild nonobstructive CAD, PAF and alcohol use  Presents to [facility] ER today with palpitations, tachycardia.  Pt in Afib with rates 140s.    She was started on IV diltiazem and has converted to SR     Pt currently feeling better  She denies missing any anticoagulation. Does admit to drinking several glasses of wine per day.   Pt also says she snores   Does not know if she has sleep apnea   On exam  Neck   JVP is normal  No bruits Lungs are CTA Cardiac   RRR  No S3  No murmurs ABd is supple  Ext are without edema  PAF   Converted to SR on own after IV diltiazem started  I would continue on home dose of Cardiazem 240 CD  I would add metoprolol 25 mg bid to regimen    She can take extra as needed as outpt if has more flares Most likely episode exacerbated by EtOH use   Counselled on cessation.  With hx of snoring would also recomm outpt sleep eval to rule out OSA Keep on anticoagulation   Will follow while in hospital  Dorris Carnes MD

## 2022-12-03 NOTE — ED Notes (Signed)
Pt provided sandwich and water. Readjusted in bed. Denies any pain, lightheaded or dizziness. No other needs requested at this time. Call bell in reach

## 2022-12-03 NOTE — ED Notes (Signed)
Pt converted back to normal sinus rhythm (doctor notified) Pt stated fluttering in her chest has stopped, however she does feel a bit of fluttering in her throat Pt was offered zofran due to nausea, but she stated she wanted to hold off

## 2022-12-03 NOTE — Plan of Care (Addendum)
  Problem: Activity: Goal: Ability to tolerate increased activity will improve Outcome: Progressing   Problem: Education: Goal: Knowledge of General Education information will improve Description: Including pain rating scale, medication(s)/side effects and non-pharmacologic comfort measures Outcome: Progressing   Problem: Activity: Goal: Risk for activity intolerance will decrease Outcome: Progressing   Problem: Coping: Goal: Level of anxiety will decrease Outcome: Progressing   Problem: Elimination: Goal: Will not experience complications related to urinary retention Outcome: Progressing   Problem: Pain Managment: Goal: General experience of comfort will improve Outcome: Progressing   Problem: Safety: Goal: Ability to remain free from injury will improve Outcome: Progressing   Problem: Skin Integrity: Goal: Risk for impaired skin integrity will decrease Outcome: Progressing

## 2022-12-03 NOTE — ED Provider Notes (Signed)
[facility] Provider Note   CSN: [id number] Arrival date & time: 12/03/22  0725     History  Chief Complaint  Patient presents with   Chest Pain    Sheila Bruce is a 77 y.o. female.   Chest Pain    Patient has a history of hypertension, hyperlipidemia, paroxysmal atrial fibrillation.  Patient states she has a history of atrial fibrillation but has never required cardioversion.  She can feel herself may be going in and out on occasion but never for prolonged period of time.  This morning when she woke up she noticed her heart was racing and she felt like she was back in atrial fibrillation.  Those symptoms have persisted.  She is not having any chest pain or shortness of breath.  No fevers or chills.  No vomiting or diarrhea.  Patient has been taking all of her medications including her Eliquis.  Home Medications Prior to Admission medications   Medication Sig Start Date End Date Taking? Authorizing Provider  acetaminophen (TYLENOL 8 HOUR) 650 MG CR tablet Take 1,300 mg by mouth every 8 (eight) hours as needed for pain.    [provider]  apixaban (ELIQUIS) 5 MG TABS tablet Take 1 tablet (5 mg total) by mouth 2 (two) times daily. 01/13/13   Hosie Poisson, MD  atorvastatin (LIPITOR) 80 MG tablet Take 1 tablet (80 mg total) by mouth daily. 03/16/22   Imogene Burn, PA-C  BIOTIN PO Take by mouth.    [provider]  Cholecalciferol (VITAMIN D3) 50 MCG (2000 UT) CAPS Take 1 capsule by mouth daily.    [provider]  diltiazem (CARDIZEM CD) 240 MG 24 hr capsule Take 240 mg by mouth daily. 03/09/17   [provider]  famotidine (PEPCID) 20 MG tablet Take 20 mg by mouth daily. 10/31/21   [provider]  fluticasone (FLONASE) 50 MCG/ACT nasal spray Place 2 sprays into the nose daily.    [provider]  hydrochlorothiazide (HYDRODIURIL) 25 MG tablet Take 25 mg by mouth daily.    [provider]  hydrOXYzine (ATARAX) 10 MG tablet Take 10 mg by mouth every 6 (six) hours as needed for anxiety. 10/04/22   [provider]  loratadine (CLARITIN) 10 MG tablet Take 10 mg by mouth daily.    [provider]  losartan (COZAAR) 100 MG tablet Take 100 mg by mouth daily.    [provider]  Multiple Vitamin (MULTIVITAMIN) tablet Take 1 tablet by mouth daily.    [provider]  Omega-3 Fatty Acids (FISH OIL) 500 MG CAPS Take by mouth daily. Per patient taking 1200 mg everyday    [provider]  vitamin E 180 MG (400 UNITS) capsule 1 capsule    [provider]      Allergies    Ciprocinonide [fluocinolone], Codeine, Ciprofloxacin, and Doxycycline    Review of Systems   Review of Systems  Cardiovascular:  Positive for chest pain.    Physical Exam Updated Vital Signs BP 114/75   Pulse 95   Temp 98.2 F (36.8 C) (Oral)   Resp (!) 21   Ht 1.524 m (5')   Wt 64.4 kg   SpO2 95%   BMI 27.73 kg/m  Physical Exam Vitals and nursing note reviewed.  Constitutional:      General: She is not in acute distress.    Appearance: She is well-developed.  HENT:  Head: Normocephalic and atraumatic.     Right Ear: External ear normal.     Left Ear: External ear normal.  Eyes:     General: No scleral icterus.       Right eye: No discharge.        Left eye: No discharge.     Conjunctiva/sclera: Conjunctivae normal.  Neck:     Trachea: No tracheal deviation.  Cardiovascular:     Rate and Rhythm: Tachycardia present. Rhythm regularly irregular.  Pulmonary:     Effort: Pulmonary effort is normal. No respiratory distress.     Breath sounds: Normal breath sounds. No stridor. No wheezing or rales.  Abdominal:     General: Bowel sounds are normal. There is no distension.     Palpations: Abdomen is soft.     Tenderness: There is no abdominal tenderness. There is no guarding or rebound.  Musculoskeletal:        General: No  tenderness or deformity.     Cervical back: Neck supple.  Skin:    General: Skin is warm and dry.     Findings: No rash.  Neurological:     General: No focal deficit present.     Mental Status: She is alert.     Cranial Nerves: No cranial nerve deficit, dysarthria or facial asymmetry.     Sensory: No sensory deficit.     Motor: No abnormal muscle tone or seizure activity.     Coordination: Coordination normal.  Psychiatric:        Mood and Affect: Mood normal.     ED Results / Procedures / Treatments   Labs (all labs ordered are listed, but only abnormal results are displayed) Labs Reviewed  BASIC METABOLIC PANEL - Abnormal; Notable for the following components:      Result Value   Potassium 3.3 (*)    Glucose, Bld 128 (*)    BUN 28 (*)    Anion gap 16 (*)    All other components within normal limits  CBC - Abnormal; Notable for the following components:   Hemoglobin 15.3 (*)    HCT 46.4 (*)    All other components within normal limits  MAGNESIUM    EKG EKG Interpretation  Date/Time:  Monday December 03 2022 07:37:52 EST Ventricular Rate:  141 PR Interval:    QRS Duration: 83 QT Interval:  295 QTC Calculation: 452 R Axis:   18 Text Interpretation: Atrial fibrillation a fib new since last tracing Confirmed by Dorie Rank [phone]) on 12/03/2022 7:47:37 AM  Radiology No results found.  Procedures Procedures    Medications Ordered in ED Medications  diltiazem (CARDIZEM) 1 mg/mL load via infusion 20 mg (20 mg Intravenous Bolus from Bag 12/03/22 0802)    And  diltiazem (CARDIZEM) 125 mg in dextrose 5% 125 mL (1 mg/mL) infusion (5 mg/hr Intravenous New Bag/Given 12/03/22 0758)  potassium chloride SA (KLOR-CON M) CR tablet 40 mEq (has no administration in time range)  potassium chloride SA (KLOR-CON M) CR tablet 40 mEq (40 mEq Oral Given 12/03/22 0845)    ED Course/ Medical Decision Making/ A&P Clinical Course as of 12/03/22 0931  Mon Dec 03, 2022  0920 Pt still in  a fib.  Heart rate mostly in the 90s but at times up to 130s transiently.  Will continue with cardizem.  Discussed option of cardioversion.  Pt not interested in that option right now.   [JK]  0930 Case discussed with Dr. Olevia Bowens.  Patient will be  admitted to the medical service.  I Dr. Oval Linsey cardiology.  They will evaluate the patient in the hospital [JK]    Clinical Course User Index [JK] Dorie Rank, MD         CHA2DS2-VASc Score: 3                    Medical Decision Making Problems Addressed: Atrial fibrillation with rapid ventricular response [facility]): acute illness or injury that poses a threat to life or bodily functions Hypokalemia: acute illness or injury  Amount and/or Complexity of Data Reviewed Labs: ordered. Decision-making details documented in ED Course.  Risk Prescription drug management. Decision regarding hospitalization.   Patient presents ED for evaluation of palpitations and tachycardia.  Patient has history of atrial fibrillation.  She felt like she was back in it this morning.  Initial EKG consistent with atrial fibrillation rapid rate.  Patient has been compliant with her home medications.  Patient's not having any chest pain or shortness of breath.  I doubt ACS.  Patient was treated with IV Cardizem with improvement of normal range but she still will transiently up to the 130s while on her Cardizem infusion.  Not quite ready for transition to oral medications at this time.  Discussed options of cardioversion in the ED.  Patient has never been cardioverted and does not want to proceed with that at this time.  Will continue Cardizem infusion.  I will consult with cardiology and medical service for admisison, rate management, pt may be a candidate for oral antiarrhythmic agents.        Final Clinical Impression(s) / ED Diagnoses Final diagnoses:  Atrial fibrillation with rapid ventricular response ([place])  Hypokalemia    Rx / DC Orders ED Discharge Orders      None         Dorie Rank, MD 12/03/22 [phone]

## 2022-12-04 ENCOUNTER — Other Ambulatory Visit: Payer: Self-pay | Admitting: Student

## 2022-12-04 ENCOUNTER — Encounter (HOSPITAL_COMMUNITY): Payer: Self-pay

## 2022-12-04 ENCOUNTER — Ambulatory Visit (HOSPITAL_COMMUNITY): Payer: Medicare Other

## 2022-12-04 ENCOUNTER — Observation Stay (HOSPITAL_BASED_OUTPATIENT_CLINIC_OR_DEPARTMENT_OTHER): Payer: Medicare Other

## 2022-12-04 DIAGNOSIS — I480 Paroxysmal atrial fibrillation: Secondary | ICD-10-CM | POA: Diagnosis not present

## 2022-12-04 DIAGNOSIS — I4891 Unspecified atrial fibrillation: Secondary | ICD-10-CM

## 2022-12-04 LAB — COMPREHENSIVE METABOLIC PANEL
ALT: 31 U/L (ref 0–44)
AST: 26 U/L (ref 15–41)
Albumin: 3.5 g/dL (ref 3.5–5.0)
Alkaline Phosphatase: 69 U/L (ref 38–126)
Anion gap: 10 (ref 5–15)
BUN: 27 mg/dL — ABNORMAL HIGH (ref 8–23)
CO2: 24 mmol/L (ref 22–32)
Calcium: 9 mg/dL (ref 8.9–10.3)
Chloride: 104 mmol/L (ref 98–111)
Creatinine, Ser: 0.94 mg/dL (ref 0.44–1.00)
GFR, Estimated: >60 mL/min (ref >60)
Glucose, Bld: 108 mg/dL — ABNORMAL HIGH (ref 70–99)
Potassium: 3.5 mmol/L (ref 3.5–5.1)
Sodium: 138 mmol/L (ref 135–145)
Total Bilirubin: 1.4 mg/dL — ABNORMAL HIGH (ref 0.3–1.2)
Total Protein: 6.3 g/dL — ABNORMAL LOW (ref 6.5–8.1)

## 2022-12-04 LAB — ECHOCARDIOGRAM COMPLETE
Area-P 1/2: 3.31 cm2
Calc EF: 62.5 %
Height: 60 in
S' Lateral: 2.3 cm
Single Plane A2C EF: 63.7 %
Single Plane A4C EF: 59.2 %
Weight: 2349.22 oz

## 2022-12-04 LAB — CBC
HCT: 38.7 % (ref 36.0–46.0)
Hemoglobin: 12.8 g/dL (ref 12.0–15.0)
MCH: 31.5 pg (ref 26.0–34.0)
MCHC: 33.1 g/dL (ref 30.0–36.0)
MCV: 95.3 fL (ref 80.0–100.0)
Platelets: 211 10*3/uL (ref 150–400)
RBC: 4.06 MIL/uL (ref 3.87–5.11)
RDW: 14.9 % (ref 11.5–15.5)
WBC: 8.5 10*3/uL (ref 4.0–10.5)
nRBC: 0 % (ref 0.0–0.2)

## 2022-12-04 MED ORDER — METOPROLOL TARTRATE 25 MG PO TABS: 25.0000 mg | ORAL_TABLET | Freq: Two times a day (BID) | ORAL | 0 refills | Status: DC

## 2022-12-04 NOTE — Discharge Summary (Signed)
Physician Discharge Summary  Sheila Bruce [id number] DOB: Dec 25, 1945 DOA: 12/03/2022  PCP: Kathyrn Lass, MD  Admit date: 12/03/2022 Discharge date: 12/04/2022  Admitted From: Home Disposition:  Home  Discharge Condition:Stable CODE STATUS:FULL Diet recommendation: Heart Healthy   Brief/Interim Summary:  Sheila Bruce is a 77 y.o. 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    She was found to be in A-fib with RVR on presentation and was started on Cardizem drip.  She spontaneously converted to normal sinus rhythm.  Cardiology also consulted during this hospitalization.  Added metoprolol. She remains in normal sinus rhythm this morning.  Cardiology cleared for discharge.   Following problems were addressed during the hospitalization:  A-fib with RVR: Currently remains in normal sinus rhythm.  Initially started on Cardizem drip now stopped.  She will continue her home Cardizem dose.  Added metoprolol here.  She will follow-up with cardiology as an outpatient. Echocardiogram showed normal EF, grade 1 diastolic dysfunction.  Hypertension: Takes Cardizem, hydrochlorothiazide, losartan at home.  Currently her blood pressure stable on Cardizem, metoprolol.  Will hold hydrochlorothiazide and losartan for now.  Hyperlipidemia: On lipitor  GERD: On famotidine  Hypokalemia: Now stable   Discharge Diagnoses:  Principal Problem:   Atrial fibrillation with RVR ([place]) Active Problems:   Hypertension   Hyperlipidemia   PAF (paroxysmal  atrial fibrillation) (HCC)   Gastroesophageal reflux disease   Hypokalemia    Discharge Instructions  Discharge Instructions     Amb referral to [facility]   Complete by: As directed    Diet - low sodium heart healthy   Complete by: As directed    Discharge instructions   Complete by: As directed    1)Please take prescribed medications as instructed 2)Follow up with your PCP in a week 3)Follow up with cardiology.You have an appointment on 3/12 4)Limit alcohol intake 5)We have held your blood pressure medications losartan and hydrochlorothiazide for now because your blood pressure stable.  Monitor blood pressure at home.  Restart these medications if needed.Discuss with your PCP   Increase activity slowly   Complete by: As directed       Allergies as of 12/04/2022       Reactions   Ciprocinonide [fluocinolone] Rash   Codeine Itching   Ciprofloxacin    Doxycycline Nausea And Vomiting        Medication List     STOP taking these medications    hydrochlorothiazide 25 MG tablet Commonly known as: HYDRODIURIL   losartan 100 MG tablet Commonly known as: COZAAR       TAKE these medications    apixaban 5 MG Tabs tablet Commonly known as: ELIQUIS Take 1 tablet (5 mg total) by mouth 2 (two) times daily.   atorvastatin 80 MG tablet Commonly known as: LIPITOR Take 1 tablet (80 mg total) by mouth daily.   BIOTIN PO Take by mouth.   diltiazem 240 MG 24 hr capsule Commonly known as: CARDIZEM CD Take 240 mg by mouth daily.   famotidine 20 MG tablet Commonly known as: PEPCID Take 20 mg by mouth daily.   Fish Oil 500 MG Caps Take by  mouth daily. Per patient taking 1200 mg everyday   fluticasone 50 MCG/ACT nasal spray Commonly known as: FLONASE Place 2 sprays into the nose daily.   hydrOXYzine 10 MG tablet Commonly known as: ATARAX Take 10 mg by mouth every 6 (six) hours as needed for anxiety.   loratadine 10 MG tablet Commonly known as: CLARITIN Take  10 mg by mouth daily.   metoprolol tartrate 25 MG tablet Commonly known as: LOPRESSOR Take 1 tablet (25 mg total) by mouth 2 (two) times daily.   multivitamin tablet Take 1 tablet by mouth daily.   Tylenol 8 Hour 650 MG CR tablet Generic drug: acetaminophen Take 1,300 mg by mouth every 8 (eight) hours as needed for pain.   vitamin D3 50 MCG (2000 UT) Caps Take 1 capsule by mouth daily.   vitamin E 180 MG (400 UNITS) capsule 1 capsule        Follow-up Information     Elgie Collard, PA-C Follow up.   Specialty: Cardiology Why: Hospital follow-up with Cardiology scheduled for 12/18/2022 at 10:05am. Please arrive 15 minutes early for check-in. If this date/time does not work for you, please call our office to reschedule. Our office will also call you to help arrange the outpatient sleep study. Contact information: [address] [place] [address] [phone]         Kathyrn Lass, MD. Schedule an appointment as soon as possible for a visit in 1 week(s).   Specialty: Family Medicine Contact information: [place] [address] [phone]                Allergies  Allergen Reactions   Ciprocinonide [Fluocinolone] Rash   Codeine Itching   Ciprofloxacin    Doxycycline Nausea And Vomiting    Consultations: Cardiology   Procedures/Studies: ECHOCARDIOGRAM COMPLETE  Result Date: 12/04/2022    ECHOCARDIOGRAM REPORT   Patient Name:   Sheila Bruce Date of Exam: 12/04/2022 Medical Rec #:  [id number]        Height:       60.0 in Accession #:    [id number]       Weight:       146.8 lb Date of Birth:  1945/12/19        BSA:          1.637 m Patient Age:    47 years         BP:           122/55 mmHg Patient Gender: F                HR:           63 bpm. Exam Location:  Inpatient Procedure: 2D Echo, Cardiac Doppler and Color Doppler Indications:    I48.91* Unspeicified atrial fibrillation  History:        Patient has prior history of  Echocardiogram examinations, most                 recent 08/21/2021. Abnormal ECG, Arrythmias:Atrial Fibrillation,                 Signs/Symptoms:Dizziness/Lightheadedness, Shortness of Breath                 and Dyspnea; Risk Factors:Hypertension, Dyslipidemia and Former                 Smoker.  Sonographer:    Roseanna Rainbow RDCS Referring Phys: [id number] [place]  1.  Left ventricular ejection fraction, by estimation, is 60 to 65%. The left ventricle has normal function. The left ventricle has no regional wall motion abnormalities. Left ventricular diastolic parameters are consistent with Grade I diastolic dysfunction (impaired relaxation).  2. Right ventricular systolic function is normal. The right ventricular size is normal. There is normal pulmonary artery systolic pressure. The estimated right ventricular systolic pressure is [id number] mmHg.  3. The mitral valve is normal in structure. Mild mitral valve regurgitation. No evidence of mitral stenosis.  4. The aortic valve is normal in structure. Aortic valve regurgitation is not visualized. No aortic stenosis is present.  5. The inferior vena cava is normal in size with greater than 50% respiratory variability, suggesting right atrial pressure of 3 mmHg. Comparison(s): No significant change from prior study. FINDINGS  Left Ventricle: Left ventricular ejection fraction, by estimation, is 60 to 65%. The left ventricle has normal function. The left ventricle has no regional wall motion abnormalities. The left ventricular internal cavity size was normal in size. There is  no left ventricular hypertrophy. Left ventricular diastolic parameters are consistent with Grade I diastolic dysfunction (impaired relaxation). Indeterminate filling pressures. Right Ventricle: The right ventricular size is normal. No increase in right ventricular wall thickness. Right ventricular systolic function is normal. There is normal pulmonary artery systolic pressure. The  tricuspid regurgitant velocity is 2.53 m/s, and  with an assumed right atrial pressure of 3 mmHg, the estimated right ventricular systolic pressure is [id number] mmHg. Left Atrium: Left atrial size was normal in size. Right Atrium: Right atrial size was normal in size. Pericardium: There is no evidence of pericardial effusion. Mitral Valve: The mitral valve is normal in structure. Mild mitral valve regurgitation, with centrally-directed jet. No evidence of mitral valve stenosis. Tricuspid Valve: The tricuspid valve is normal in structure. Tricuspid valve regurgitation is not demonstrated. No evidence of tricuspid stenosis. Aortic Valve: The aortic valve is normal in structure. Aortic valve regurgitation is not visualized. No aortic stenosis is present. Pulmonic Valve: The pulmonic valve was normal in structure. Pulmonic valve regurgitation is not visualized. No evidence of pulmonic stenosis. Aorta: The aortic root is normal in size and structure. Venous: The inferior vena cava is normal in size with greater than 50% respiratory variability, suggesting right atrial pressure of 3 mmHg. IAS/Shunts: No atrial level shunt detected by color flow Doppler.  LEFT VENTRICLE PLAX 2D LVIDd:         3.60 cm     Diastology LVIDs:         2.30 cm     LV e' medial:    6.74 cm/s LV PW:         1.05 cm     LV E/e' medial:  13.4 LV IVS:        1.05 cm     LV e' lateral:   7.51 cm/s LVOT diam:     1.80 cm     LV E/e' lateral: 12.0 LV SV:         79 LV SV Index:   48 LVOT Area:     2.54 cm  LV Volumes (MOD) LV vol d, MOD A2C: 42.1 ml LV vol d, MOD A4C: 54.2 ml LV vol s, MOD A2C: 15.3 ml LV vol s, MOD A4C: 22.1 ml LV SV MOD A2C:     26.8 ml LV SV MOD A4C:     54.2 ml LV SV MOD BP:      30.9 ml RIGHT VENTRICLE  IVC RV S prime:     9.14 cm/s  IVC diam: 1.70 cm TAPSE (M-mode): 1.9 cm LEFT ATRIUM           Index        RIGHT ATRIUM           Index LA diam:      2.50 cm 1.53 cm/m   RA Area:     12.10 cm LA Vol (A2C): 23.1 ml 14.11  ml/m  RA Volume:   28.00 ml  17.11 ml/m LA Vol (A4C): 28.7 ml 17.53 ml/m  AORTIC VALVE LVOT Vmax:   131.00 cm/s LVOT Vmean:  85.900 cm/s LVOT VTI:    0.310 m  AORTA Ao Root diam: 2.90 cm Ao Asc diam:  3.15 cm MITRAL VALVE                TRICUSPID VALVE MV Area (PHT): 3.31 cm     TR Peak grad:   25.6 mmHg MV Decel Time: 229 msec     TR Vmax:        253.00 cm/s MV E velocity: 90.30 cm/s MV A velocity: 117.00 cm/s  SHUNTS MV E/A ratio:  0.77         Systemic VTI:  0.31 m                             Systemic Diam: 1.80 cm Dani Gobble Croitoru MD Electronically signed by Sanda Klein MD Signature Date/Time: 12/04/2022/9:10:39 AM    Final       Subjective: Patient seen and examined at bedside today.  Hemodynamically stable for discharge.  On normal sinus rhythm.  Husband at bedside.  Denies any complaints  Discharge Exam: Vitals:   12/04/22 0528 12/04/22 0914  BP:  (!) 126/58  Pulse:  66  Resp:    Temp: 97.9 F (36.6 C)   SpO2:     Vitals:   12/03/22 1900 12/03/22 2351 12/04/22 0528 12/04/22 0914  BP: 120/61 (!) 122/55  (!) 126/58  Pulse:  61  66  Resp:      Temp: 98.7 F (37.1 C) 98.7 F (37.1 C) 97.9 F (36.6 C)   TempSrc: Oral Oral Oral   SpO2:      Weight:      Height:        General: Pt is alert, awake, not in acute distress Cardiovascular: RRR, S1/S2 +, no rubs, no gallops Respiratory: CTA bilaterally, no wheezing, no rhonchi Abdominal: Soft, NT, ND, bowel sounds + Extremities: no edema, no cyanosis    The results of significant diagnostics from this hospitalization (including imaging, microbiology, ancillary and laboratory) are listed below for reference.     Microbiology: No results found for this or any previous visit (from the past 240 hour(s)).   Labs: BNP (last 3 results) No results for input(s): "BNP" in the last 8760 hours. Basic Metabolic Panel: Recent Labs  Lab 12/03/22 0752 12/04/22 0432  NA 141 138  K 3.3* 3.5  CL 101 104  CO2 24 24  GLUCOSE 128*  108*  BUN 28* 27*  CREATININE 0.91 0.94  CALCIUM 9.4 9.0  MG 2.4  --    Liver Function Tests: Recent Labs  Lab 12/04/22 0432  AST 26  ALT 31  ALKPHOS 69  BILITOT 1.4*  PROT 6.3*  ALBUMIN 3.5   No results for input(s): "LIPASE", "AMYLASE" in the last 168 hours. No results for input(s): "AMMONIA" in the  last 168 hours. CBC: Recent Labs  Lab 12/03/22 0752 12/04/22 0432  WBC 7.1 8.5  HGB 15.3* 12.8  HCT 46.4* 38.7  MCV 92.4 95.3  PLT 260 211   Cardiac Enzymes: No results for input(s): "CKTOTAL", "CKMB", "CKMBINDEX", "TROPONINI" in the last 168 hours. BNP: Invalid input(s): "POCBNP" CBG: No results for input(s): "GLUCAP" in the last 168 hours. D-Dimer No results for input(s): "DDIMER" in the last 72 hours. Hgb A1c No results for input(s): "HGBA1C" in the last 72 hours. Lipid Profile No results for input(s): "CHOL", "HDL", "LDLCALC", "TRIG", "CHOLHDL", "LDLDIRECT" in the last 72 hours. Thyroid function studies Recent Labs    12/03/22 1700  TSH 0.553   Anemia work up No results for input(s): "VITAMINB12", "FOLATE", "FERRITIN", "TIBC", "IRON", "RETICCTPCT" in the last 72 hours. Urinalysis    Component Value Date/Time   COLORURINE YELLOW 03/31/2010 1822   APPEARANCEUR CLEAR 03/31/2010 1822   LABSPEC 1.020 12/20/2013 1212   PHURINE 5.5 12/20/2013 1212   GLUCOSEU NEGATIVE 12/20/2013 1212   HGBUR NEGATIVE 12/20/2013 1212   BILIRUBINUR NEGATIVE 12/20/2013 1212   KETONESUR NEGATIVE 12/20/2013 1212   PROTEINUR NEGATIVE 12/20/2013 1212   UROBILINOGEN 0.2 12/20/2013 1212   NITRITE POSITIVE (A) 12/20/2013 1212   LEUKOCYTESUR TRACE (A) 12/20/2013 1212   Sepsis Labs Recent Labs  Lab 12/03/22 0752 12/04/22 0432  WBC 7.1 8.5   Microbiology No results found for this or any previous visit (from the past 240 hour(s)).  Please note: You were cared for by a hospitalist during your hospital stay. Once you are discharged, your primary care physician will handle any  further medical issues. Please note that NO REFILLS for any discharge medications will be authorized once you are discharged, as it is imperative that you return to your primary care physician (or establish a relationship with a primary care physician if you do not have one) for your post hospital discharge needs so that they can reassess your need for medications and monitor your lab values.    Time coordinating discharge: 40 minutes  SIGNED:   Shelly Coss, MD  [facility] 12/04/2022, 11:29 AM Pager [phone]  If 7PM-7AM, please contact night-coverage [email or web address] Password [email or web address]

## 2022-12-04 NOTE — Progress Notes (Signed)
Patient to be discharged to home today. Patient and Patient's Husband given discharge instructions including all discharge Medications and schedules for these Medications. Understanding verbalized by the Patient Discharge AVS with the Patient at time of discharge

## 2022-12-04 NOTE — Progress Notes (Signed)
Error

## 2022-12-04 NOTE — Progress Notes (Signed)
.   Transition of Care [facility]) Screening Note   Patient Details  Name: Sheila Bruce Date of Birth: May 16, 1946   Transition of Care [facility]) CM/SW Contact:    Illene Regulus, LCSW Phone Number: 12/04/2022, 12:30 PM    Transition of Care Department [facility]) has reviewed patient and no TOC needs have been identified at this time. We will continue to monitor patient advancement through interdisciplinary progression rounds. If new patient transition needs arise, please place a TOC consult.

## 2022-12-04 NOTE — Progress Notes (Signed)
  Echocardiogram 2D Echocardiogram has been performed.  Sheila Bruce 12/04/2022, 8:35 AM

## 2022-12-04 NOTE — Progress Notes (Signed)
Rounding Note    Patient Name: Sheila Bruce Date of Encounter: 12/04/2022  [place] Cardiologist: Sherren Mocha, MD   Subjective    Breathing is good   No CP   NO dizziness  Inpatient Medications    Scheduled Meds:  apixaban  5 mg Oral BID   atorvastatin  80 mg Oral q1800   diltiazem  240 mg Oral Daily   famotidine  20 mg Oral Daily   metoprolol tartrate  25 mg Oral BID   potassium chloride  40 mEq Oral Q lunch   Continuous Infusions:  PRN Meds: acetaminophen **OR** acetaminophen, ondansetron **OR** ondansetron (ZOFRAN) IV, mouth rinse   Vital Signs    Vitals:   12/03/22 1900 12/03/22 2351 12/04/22 0528 12/04/22 0914  BP: 120/61 (!) 122/55  (!) 126/58  Pulse:  61  66  Resp:      Temp: 98.7 F (37.1 C) 98.7 F (37.1 C) 97.9 F (36.6 C)   TempSrc: Oral Oral Oral   SpO2:      Weight:      Height:        Intake/Output Summary (Last 24 hours) at 12/04/2022 0932 Last data filed at 12/04/2022 [id number] Gross per 24 hour  Intake 508.39 ml  Output 1100 ml  Net -591.61 ml      12/03/2022    3:28 PM 12/03/2022    7:34 AM 10/02/2022   10:01 PM  Last 3 Weights  Weight (lbs) 146 lb 13.2 oz 142 lb 143 lb 8.3 oz  Weight (kg) 66.6 kg 64.411 kg 65.1 kg      Telemetry    SR  - Personally Reviewed  ECG    No new  - Personally Reviewed  Physical Exam   GEN: No acute distress.   Neck: No JVD Cardiac: RRR, no murmur Respiratory: Clear to auscultation bilaterally. GI: Soft, nontender, non-distended  MS: No edema   Labs    High Sensitivity Troponin:  No results for input(s): "TROPONINIHS" in the last 720 hours.   Chemistry Recent Labs  Lab 12/03/22 0752 12/04/22 0432  NA 141 138  K 3.3* 3.5  CL 101 104  CO2 24 24  GLUCOSE 128* 108*  BUN 28* 27*  CREATININE 0.91 0.94  CALCIUM 9.4 9.0  MG 2.4  --   PROT  --  6.3*  ALBUMIN  --  3.5  AST  --  26  ALT  --  31  ALKPHOS  --  69  BILITOT  --  1.4*  GFRNONAA >60 >60  ANIONGAP 16* 10     Lipids No results for input(s): "CHOL", "TRIG", "HDL", "LABVLDL", "LDLCALC", "CHOLHDL" in the last 168 hours.  Hematology Recent Labs  Lab 12/03/22 0752 12/04/22 0432  WBC 7.1 8.5  RBC 5.02 4.06  HGB 15.3* 12.8  HCT 46.4* 38.7  MCV 92.4 95.3  MCH 30.5 31.5  MCHC 33.0 33.1  RDW 15.0 14.9  PLT 260 211   Thyroid  Recent Labs  Lab 12/03/22 1700  TSH 0.553    BNPNo results for input(s): "BNP", "PROBNP" in the last 168 hours.  DDimer No results for input(s): "DDIMER" in the last 168 hours.   Radiology    ECHOCARDIOGRAM COMPLETE  Result Date: 12/04/2022    ECHOCARDIOGRAM REPORT   Patient Name:   Sheila Bruce Date of Exam: 12/04/2022 Medical Rec #:  [id number]        Height:       60.0 in Accession #:  [id number]       Weight:       146.8 lb Date of Birth:  03/08/1946        BSA:          1.637 m Patient Age:    77 years         BP:           122/55 mmHg Patient Gender: F                HR:           63 bpm. Exam Location:  Inpatient Procedure: 2D Echo, Cardiac Doppler and Color Doppler Indications:    I48.91* Unspeicified atrial fibrillation  History:        Patient has prior history of Echocardiogram examinations, most                 recent 08/21/2021. Abnormal ECG, Arrythmias:Atrial Fibrillation,                 Signs/Symptoms:Dizziness/Lightheadedness, Shortness of Breath                 and Dyspnea; Risk Factors:Hypertension, Dyslipidemia and Former                 Smoker.  Sonographer:    Roseanna Rainbow RDCS Referring Phys: [id number] [place]  1. Left ventricular ejection fraction, by estimation, is 60 to 65%. The left ventricle has normal function. The left ventricle has no regional wall motion abnormalities. Left ventricular diastolic parameters are consistent with Grade I diastolic dysfunction (impaired relaxation).  2. Right ventricular systolic function is normal. The right ventricular size is normal. There is normal pulmonary artery systolic pressure. The  estimated right ventricular systolic pressure is XX123456 mmHg.  3. The mitral valve is normal in structure. Mild mitral valve regurgitation. No evidence of mitral stenosis.  4. The aortic valve is normal in structure. Aortic valve regurgitation is not visualized. No aortic stenosis is present.  5. The inferior vena cava is normal in size with greater than 50% respiratory variability, suggesting right atrial pressure of 3 mmHg. Comparison(s): No significant change from prior study. FINDINGS  Left Ventricle: Left ventricular ejection fraction, by estimation, is 60 to 65%. The left ventricle has normal function. The left ventricle has no regional wall motion abnormalities. The left ventricular internal cavity size was normal in size. There is  no left ventricular hypertrophy. Left ventricular diastolic parameters are consistent with Grade I diastolic dysfunction (impaired relaxation). Indeterminate filling pressures. Right Ventricle: The right ventricular size is normal. No increase in right ventricular wall thickness. Right ventricular systolic function is normal. There is normal pulmonary artery systolic pressure. The tricuspid regurgitant velocity is 2.53 m/s, and  with an assumed right atrial pressure of 3 mmHg, the estimated right ventricular systolic pressure is [id number] mmHg. Left Atrium: Left atrial size was normal in size. Right Atrium: Right atrial size was normal in size. Pericardium: There is no evidence of pericardial effusion. Mitral Valve: The mitral valve is normal in structure. Mild mitral valve regurgitation, with centrally-directed jet. No evidence of mitral valve stenosis. Tricuspid Valve: The tricuspid valve is normal in structure. Tricuspid valve regurgitation is not demonstrated. No evidence of tricuspid stenosis. Aortic Valve: The aortic valve is normal in structure. Aortic valve regurgitation is not visualized. No aortic stenosis is present. Pulmonic Valve: The pulmonic valve was normal in structure.  Pulmonic valve regurgitation is not visualized. No evidence of pulmonic stenosis. Aorta: The  aortic root is normal in size and structure. Venous: The inferior vena cava is normal in size with greater than 50% respiratory variability, suggesting right atrial pressure of 3 mmHg. IAS/Shunts: No atrial level shunt detected by color flow Doppler.  LEFT VENTRICLE PLAX 2D LVIDd:         3.60 cm     Diastology LVIDs:         2.30 cm     LV e' medial:    6.74 cm/s LV PW:         1.05 cm     LV E/e' medial:  13.4 LV IVS:        1.05 cm     LV e' lateral:   7.51 cm/s LVOT diam:     1.80 cm     LV E/e' lateral: 12.0 LV SV:         79 LV SV Index:   48 LVOT Area:     2.54 cm  LV Volumes (MOD) LV vol d, MOD A2C: 42.1 ml LV vol d, MOD A4C: 54.2 ml LV vol s, MOD A2C: 15.3 ml LV vol s, MOD A4C: 22.1 ml LV SV MOD A2C:     26.8 ml LV SV MOD A4C:     54.2 ml LV SV MOD BP:      30.9 ml RIGHT VENTRICLE            IVC RV S prime:     9.14 cm/s  IVC diam: 1.70 cm TAPSE (M-mode): 1.9 cm LEFT ATRIUM           Index        RIGHT ATRIUM           Index LA diam:      2.50 cm 1.53 cm/m   RA Area:     12.10 cm LA Vol (A2C): 23.1 ml 14.11 ml/m  RA Volume:   28.00 ml  17.11 ml/m LA Vol (A4C): 28.7 ml 17.53 ml/m  AORTIC VALVE LVOT Vmax:   131.00 cm/s LVOT Vmean:  85.900 cm/s LVOT VTI:    0.310 m  AORTA Ao Root diam: 2.90 cm Ao Asc diam:  3.15 cm MITRAL VALVE                TRICUSPID VALVE MV Area (PHT): 3.31 cm     TR Peak grad:   25.6 mmHg MV Decel Time: 229 msec     TR Vmax:        253.00 cm/s MV E velocity: 90.30 cm/s MV A velocity: 117.00 cm/s  SHUNTS MV E/A ratio:  0.77         Systemic VTI:  0.31 m                             Systemic Diam: 1.80 cm Dani Gobble Croitoru MD Electronically signed by Sanda Klein MD Signature Date/Time: 12/04/2022/9:10:39 AM    Final     Cardiac Studies   Echo  LVEF and RVEF normal  Normal diastolic function. Atria are normal   Assessment & Plan    1 PAF  Pt has maintatined SR   Cardioverted on own  yesterday    I would keep on diltiazem 240 and metoprolol 25 bid    Continue Eliquis bid   WIll arrange for outpt follow up    Will also arrnage for home sleep evaluation Again, cut back/avoid EtoH  2  HTN  BP is  controlled on current regimen     Follow as outpt    For questions or updates, please contact [place] Please consult [email or web address] for contact info under        Signed, Dorris Carnes, MD  12/04/2022, 9:32 AM

## 2022-12-04 NOTE — Progress Notes (Signed)
  Echocardiogram 2D Echocardiogram has been performed.  Sheila Bruce 12/04/2022, 8:34 AM

## 2022-12-05 ENCOUNTER — Telehealth: Payer: Self-pay

## 2022-12-05 NOTE — Telephone Encounter (Signed)
Pt has appt 12-17-21 with tessa. Sent message to [place] she is off today will await response

## 2022-12-06 DIAGNOSIS — E876 Hypokalemia: Secondary | ICD-10-CM | POA: Diagnosis not present

## 2022-12-06 DIAGNOSIS — I480 Paroxysmal atrial fibrillation: Secondary | ICD-10-CM | POA: Diagnosis not present

## 2022-12-06 DIAGNOSIS — I4891 Unspecified atrial fibrillation: Secondary | ICD-10-CM | POA: Diagnosis not present

## 2022-12-17 NOTE — Progress Notes (Unsigned)
Office Visit    Patient Name: Sheila Bruce Date of Encounter: 12/18/2022  PCP:  Kathyrn Lass, [facility]  Cardiologist:  Sherren Mocha, MD  Advanced Practice Provider:  No care team member to display Electrophysiologist:  None   HPI    Sheila Bruce is a 77 y.o. female who is a retired Therapist, sports from [employer] with a hx of paraoxysmal atrial fibrillation on Eliquis, mild aortic sclerosis, hypertension and mixed HLD, former smoking presents today for follow-up appointment.    The patient saw Dr. Burt Knack 08/23/21 and was doing well. The plan was to repeat an echo in 3-4 years, he went to the ED for chest pain 10/26/2021 and troponins are negative.  EKG with no change.  Dr. Burt Knack reviewed and said to do a Lexi scan if recurrent chest pain.  Coronary CTA with calcium score was ordered 3/23 which was 207 (25 to 49% LAD stenosis).  Statin was increased.  Patient was seen 01/2022 and was doing well at that time.  Patient was last seen by Estella Husk, PA-C on 09/2022 and at that time had stopped her HCTZ because of low blood pressure.  Occasional palpitations that lasted 15 seconds.  No chest pain or dyspnea with it.  No edema.  No bleeding problems on Eliquis.  No regular exercise.  She was doing gardening and walks with her dog.  Golfs regularly.  She was in the hospital for Afib RVR back on 2/26-2/27. Echo obtained.  She spontaneously converted to normal sinus rhythm.  Cardiology was consulted and metoprolol was added.  Remained in normal sinus rhythm so was cleared for discharge.  Echocardiogram showed normal LVEF with grade 1 DD.  Today, she feels a lot better since her change in medications.  Blood pressure has been much better controlled.  Feeling better overall.  She has a little flutter now and then but it is gone a few seconds.  Nothing like what she experienced when she went to the hospital.  She is not short of breath but does have some fatigue.  Her heart rate  is low today at 48 bpm but she states that at home she is usually in the mid 50s to 60s.  We have asked her to continue to track her blood pressure and heart rate at home.  She has had a history of mild MR since she was a young girl (positive systolic murmur since then).  She remains very active especially with gardening and enjoys golfing.  Reports no shortness of breath nor dyspnea on exertion. Reports no chest pain, pressure, or tightness. No edema, orthopnea, PND.   Past Medical History    Past Medical History:  Diagnosis Date   Anxiety    Habitual alcohol use    Heart murmur    Hx of cardiovascular stress test    a. ETT-Myoview 5/14:  Normal; EF 79%, no ischemia, exercised 10:00.   Hyperlipidemia    Hypertension    PAF (paroxysmal atrial fibrillation) ([place])    a. Echo 4/14: Mild concentric LVH, EF 123456, grade 1 diastolic dysfunction, trivial MR, PASP 29;  b. Eliquis 5 bid   Vertigo    Past Surgical History:  Procedure Laterality Date   ABDOMINAL HYSTERECTOMY     BREAST EXCISIONAL BIOPSY     CYSTOSCOPY     Laproscopy-Abdominal     TENDON REPAIR     Left hand    Allergies  Allergies  Allergen Reactions  Ciprocinonide [Fluocinolone] Rash   Codeine Itching   Ciprofloxacin    Doxycycline Nausea And Vomiting    EKGs/Labs/Other Studies Reviewed:   The following studies were reviewed today:  Coronary CTA 12/11/21  FINDINGS: Quality: Good, HR 63   Coronary calcium score: The patient's coronary artery calcium score is 207, which places the patient in the 76th percentile.   Coronary arteries: Normal coronary origins.  Right dominance.   Right Coronary Artery: Dominant.  No significant disease.   Left Main Coronary Artery: Normal. Bifurcates into the LAD and LCx arteries.   Left Anterior Descending Coronary Artery: Anterior artery that wraps around the apex. There is mild 25-49% mixed proximal stenosis (CADRADS2). 2 small diagonal branches without disease.    Left Circumflex Artery: AV groove vessel - no disease.   Aorta: Normal size, 31 mm at the mid ascending aorta (level of the PA bifurcation) measured double oblique. Aortic atherosclerosis. No dissection.   Aortic Valve: Trileaflet. No calcifications.   Other findings:   Normal pulmonary vein drainage into the left atrium.   Normal left atrial appendage without a thrombus.   Normal size of the pulmonary artery.   IMPRESSION: 1. 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   2. Coronary calcium score of 207. This was 76th percentile for age and sex matched control.   3. Normal coronary origin with right dominance.   4. Aortic atherosclerosis.   5. Aggressive cardiovascular risk factor modification is recommended.    EKG:  EKG is not ordered today.   Recent Labs: 12/03/2022: Magnesium 2.4; TSH 0.553 12/04/2022: ALT 31; BUN 27; Creatinine, Ser 0.94; Hemoglobin 12.8; Platelets 211; Potassium 3.5; Sodium 138  Recent Lipid Panel    Component Value Date/Time   CHOL 153 06/15/2022 0824   TRIG 135 06/15/2022 0824   HDL 56 06/15/2022 0824   CHOLHDL 2.7 06/15/2022 0824   CHOLHDL 2.5 04/01/2010 0445   VLDL 23 04/01/2010 0445   LDLCALC 74 06/15/2022 0824    Risk Assessment/Calculations:   CHA2DS2-VASc Score = 4   This indicates a 4.8% annual risk of stroke. The patient's score is based upon: CHF History: 0 HTN History: 1 Diabetes History: 0 Stroke History: 0 Vascular Disease History: 1 Age Score: 1 Gender Score: 1     Home Medications   Current Meds  Medication Sig   acetaminophen (TYLENOL 8 HOUR) 650 MG CR tablet Take 1,300 mg by mouth every 8 (eight) hours as needed for pain.   apixaban (ELIQUIS) 5 MG TABS tablet Take 1 tablet (5 mg total) by mouth 2 (two) times daily.   atorvastatin (LIPITOR) 80 MG tablet Take 1 tablet (80 mg total) by mouth daily.   BIOTIN PO Take by mouth.   Cholecalciferol (VITAMIN D3) 50 MCG (2000 UT) CAPS Take 1 capsule by mouth  daily.   diltiazem (CARDIZEM CD) 240 MG 24 hr capsule Take 240 mg by mouth daily.   famotidine (PEPCID) 20 MG tablet Take 20 mg by mouth daily.   fluticasone (FLONASE) 50 MCG/ACT nasal spray Place 2 sprays into the nose daily.   hydrOXYzine (ATARAX) 10 MG tablet Take 10 mg by mouth every 6 (six) hours as needed for anxiety.   loratadine (CLARITIN) 10 MG tablet Take 10 mg by mouth daily.   Multiple Vitamin (MULTIVITAMIN) tablet Take 1 tablet by mouth daily.   Omega-3 Fatty Acids (FISH OIL) 500 MG CAPS Take by mouth daily. Per patient taking 1200 mg everyday   vitamin E 180 MG (  400 UNITS) capsule 1 capsule   [DISCONTINUED] metoprolol tartrate (LOPRESSOR) 25 MG tablet Take 1 tablet (25 mg total) by mouth 2 (two) times daily.     Review of Systems      All other systems reviewed and are otherwise negative except as noted above.  Physical Exam    VS:  BP 120/78   Pulse (!) 48   Ht 5' (1.524 m)   Wt 146 lb (66.2 kg)   SpO2 91%   BMI 28.51 kg/m  , BMI Body mass index is 28.51 kg/m.  Wt Readings from Last 3 Encounters:  12/18/22 146 lb (66.2 kg)  12/03/22 146 lb 13.2 oz (66.6 kg)  10/02/22 143 lb 8.3 oz (65.1 kg)     GEN: Well nourished, well developed, in no acute distress. HEENT: normal. Neck: Supple, no JVD, carotid bruits, or masses. Cardiac: ]RRR, + systolic murmur, rubs, or gallops. No clubbing, cyanosis, edema.  Radials/PT 2+ and equal bilaterally.  Respiratory:  Respirations regular and unlabored, clear to auscultation bilaterally. GI: Soft, nontender, nondistended. MS: No deformity or atrophy. Skin: Warm and dry, no rash. Neuro:  Strength and sensation are intact. Psych: Normal affect.  Assessment & Plan     PAF on Eliquis -No issues with bleeding -Continue metoprolol 25 mg twice daily (if remains bradycardic will reduce to 12.5 twice daily) -Patient to keep track of her heart rate at home -Continue diltiazem 240 mg daily -Continue Eliquis 5 mg twice daily  Mild  aortic sclerosis on echocardiogram, mild MR -Continue to keep a close watch on cholesterol, LDL 74 -Will plan for echocardiogram every few years -Asymptomatic at this time  HTN -Well-controlled today 120/78 -Encouraged to keep track of heart rate and blood pressure at home -Continue current medication regimen  HLD -LDL 74, triglycerides 135 -At goal, continue Lipitor 80 mg daily        Disposition: Follow up 6 months with Sherren Mocha, MD or APP.  Signed, Elgie Collard, PA-C 12/18/2022, 12:44 PM  [facility]

## 2022-12-18 ENCOUNTER — Ambulatory Visit: Payer: Medicare Other | Attending: Physician Assistant | Admitting: Physician Assistant

## 2022-12-18 ENCOUNTER — Encounter: Payer: Self-pay | Admitting: Physician Assistant

## 2022-12-18 VITALS — BP 120/78 | HR 48 | Ht 60.0 in | Wt 146.0 lb

## 2022-12-18 DIAGNOSIS — I480 Paroxysmal atrial fibrillation: Secondary | ICD-10-CM

## 2022-12-18 DIAGNOSIS — I350 Nonrheumatic aortic (valve) stenosis: Secondary | ICD-10-CM

## 2022-12-18 DIAGNOSIS — I10 Essential (primary) hypertension: Secondary | ICD-10-CM

## 2022-12-18 DIAGNOSIS — E785 Hyperlipidemia, unspecified: Secondary | ICD-10-CM

## 2022-12-18 MED ORDER — METOPROLOL TARTRATE 25 MG PO TABS: 25.0000 mg | ORAL_TABLET | Freq: Two times a day (BID) | ORAL | 0 refills | Status: DC

## 2022-12-18 MED ORDER — METOPROLOL TARTRATE 25 MG PO TABS: 25.0000 mg | ORAL_TABLET | Freq: Two times a day (BID) | ORAL | 3 refills | Status: DC

## 2022-12-18 NOTE — Patient Instructions (Addendum)
Medication Instructions:  Your physician recommends that you continue on your current medications as directed. Please refer to the Current Medication list given to you today.  *If you need a refill on your cardiac medications before your next appointment, please call your pharmacy*  Lab Work: None If you have labs (blood work) drawn today and your tests are completely normal, you will receive your results only by: [place] (if you have MyChart) OR A paper copy in the mail If you have any lab test that is abnormal or we need to change your treatment, we will call you to review the results.   Follow-Up: At [facility], you and your health needs are our priority.  As part of our continuing mission to provide you with exceptional heart care, we have created designated Provider Care Teams.  These Care Teams include your primary Cardiologist (physician) and Advanced Practice Providers (APPs -  Physician Assistants and Nurse Practitioners) who all work together to provide you with the care you need, when you need it.  Your next appointment:   3-6 month(s)  Provider:   Sherren Mocha, MD    Other Instructions Check your blood pressure and heart rate daily, one hour after taking your morning medications and keep a log.

## 2022-12-21 ENCOUNTER — Telehealth: Payer: Self-pay | Admitting: *Deleted

## 2022-12-21 NOTE — Telephone Encounter (Signed)
   Pre-operative Risk Assessment    Patient Name: Sheila Bruce  DOB: 08-17-1946 MRN: [id number]  NOT SURE IF THIS IS A DUPLICATE CLEARANCE FROM THE ONE IN 11/2022. THE ONLY DIFFERENCE BETWEEN THIS CLEARANCE REQUEST AND THE FEB 2024 REQUEST IS THE DATE OF PROCEDURE. I TRIED TO CALL THE OFFICE TO CONFIRM IF NEW PROCEDURE, THOUGH I COULD NOT GET THROUGH THE PHONE LINE.   I WILL SEND THIS MESSAGE TO THE REQUESTING OFFICE TO LET us KNOW IF THIS STILL FROM THE ORIGINAL REQUEST, JUST A DATE OF PROCEDURE OR IGF THIS TRULY A NEW REQUEST. PLEASE CALL PRE OP LINE [phone], OK TO LEAVE VM     Request for Surgical Clearance    Procedure:   ENDOSCOPY  Date of Surgery:  Clearance 01/17/23                                 Surgeon:  DR. Randel Pigg Surgeon's Group or Practice Name:  [facility] Phone number:  [phone] Fax number:  [phone]   Type of Clearance Requested:   - Medical  - Pharmacy:  Hold Apixaban (Eliquis) x 1-2 DAYS PRIOR   Type of Anesthesia:   PROPOFOL    Additional requests/questions:    Jiles Prows   12/21/2022, 9:49 AM

## 2022-12-24 ENCOUNTER — Telehealth: Payer: Self-pay | Admitting: Cardiovascular Disease

## 2022-12-24 NOTE — Telephone Encounter (Signed)
I will forward to the pre op pool for review now that we know this is a new request.

## 2022-12-24 NOTE — Telephone Encounter (Signed)
Patient c/o Palpitations:  High priority if patient c/o lightheadedness, shortness of breath, or chest pain  How long have you had palpitations/irregular HR/ Afib? Patient states she was going in and out of A-fib all day yesterday she thinks. Are you having the symptoms now? no  Are you currently experiencing lightheadedness, SOB or CP? No   Do you have a history of afib (atrial fibrillation) or irregular heart rhythm? Yes   Have you checked your BP or HR? (document readings if available): 116/73 67 after she took her meds.   Are you experiencing any other symptoms? She doesn't think so.

## 2022-12-24 NOTE — Telephone Encounter (Signed)
Sheila Bruce,  You saw this patient on 3/12.2024/. Will you please comment on clearance for EGD?  Please route your response to P CV DIV Preop. I will communicate with requesting office once you have given recommendations.   Thank you!  Mayra Reel, NP

## 2022-12-25 ENCOUNTER — Ambulatory Visit (HOSPITAL_COMMUNITY)
Admission: RE | Admit: 2022-12-25 | Discharge: 2022-12-25 | Disposition: A | Discharge status: Home / Self Care | Payer: Medicare Other | Source: Ambulatory Visit | Attending: Physician Assistant | Admitting: Physician Assistant

## 2022-12-25 DIAGNOSIS — K219 Gastro-esophageal reflux disease without esophagitis: Secondary | ICD-10-CM | POA: Insufficient documentation

## 2022-12-25 DIAGNOSIS — R1310 Dysphagia, unspecified: Secondary | ICD-10-CM | POA: Diagnosis not present

## 2022-12-25 DIAGNOSIS — K224 Dyskinesia of esophagus: Secondary | ICD-10-CM | POA: Diagnosis not present

## 2022-12-25 NOTE — Telephone Encounter (Signed)
Attempted to return call to patient, but went to voicemail. Left message asking that she call office back. Last seen by Tessa on 12/18/22 and pt was actually bradycardic at 48bpm. OV notes:  PAF on Eliquis -No issues with bleeding -Continue metoprolol 25 mg twice daily (if remains bradycardic will reduce to 12.5 twice daily) -Patient to keep track of her heart rate at home -Continue diltiazem 240 mg daily -Continue Eliquis 5 mg twice daily  Pt taking Eliquis, so covered from a stroke prevention standpoint and with history of PAF. Will await call back from pt to discuss further and assess how HR has been since visit.

## 2022-12-25 NOTE — Telephone Encounter (Signed)
   Name: Sheila Bruce  DOB: 11-27-1945  MRN: [id number]   Primary Cardiologist: Sherren Mocha, MD  Chart reviewed as part of pre-operative protocol coverage. Sheila Bruce was last seen on 12/18/2022 by Nicholes Rough, PA-C.  Per Sheila Bruce "Ms. Lumsden needs min mets and would be low risk for upcoming EGD procedure."  Therefore, based on ACC/AHA guidelines, the patient would be at acceptable risk for the planned procedure without further cardiovascular testing.   Per Pharm.D.: Patient with diagnosis of afib on Eliquis for anticoagulation.     Procedure: endoscopy Date of procedure: 01/17/23   CHA2DS2-VASc Score = 4  This indicates a 4.8% annual risk of stroke. The patient's score is based upon: CHF History: 0 HTN History: 1 Diabetes History: 0 Stroke History: 0 Vascular Disease History: 1 Age Score: 1 Gender Score: 1   CrCl 52ml/min using adj body weight Platelet count 211K   Previously cleared 11/22/22 phone note to hold Eliquis for 2 days. Went to the hospital 12/03/22 with afib, started on diltiazem drip and converted spontaneously to NSR.    Ok to hold Eliquis for 2 days prior to procedure.  I will route this recommendation to the requesting party via Epic fax function and remove from pre-op pool. Please call with questions.  Mayra Reel, NP 12/25/2022, 9:43 AM

## 2022-12-25 NOTE — Telephone Encounter (Signed)
Patient with diagnosis of afib on Eliquis for anticoagulation.    Procedure: endoscopy Date of procedure: 01/17/23  CHA2DS2-VASc Score = 4  This indicates a 4.8% annual risk of stroke. The patient's score is based upon: CHF History: 0 HTN History: 1 Diabetes History: 0 Stroke History: 0 Vascular Disease History: 1 Age Score: 1 Gender Score: 1   CrCl 70ml/min using adj body weight Platelet count 211K  Previously cleared 11/22/22 phone note to hold Eliquis for 2 days. Went to the hospital 12/03/22 with afib, started on diltiazem drip and converted spontaneously to NSR.   Ok to hold Eliquis for 2 days prior to procedure.  **This guidance is not considered finalized until pre-operative APP has relayed final recommendations.**

## 2022-12-26 NOTE — Telephone Encounter (Signed)
I spoke with patient.  She reports 4-5 episodes of heart fluttering on 3/17.  Lasted about 30-45 minutes.  Heart rate was not elevated with this. Did have a little shortness of breath and felt slightly lightheaded. Had one episode of fluttering on 3/18 in the evening. Lasted about 20 minutes. No episodes yesterday or today Heart rate running 50's -60's.  She has been cleared for upcoming endoscopy but is asking if it will still be OK to stop Eliquis prior to procedure if she continues to have fluttering episodes.

## 2022-12-26 NOTE — Telephone Encounter (Signed)
Pt returning call, informed her RN is out today, will forward to have her call when she returns

## 2022-12-27 NOTE — Telephone Encounter (Signed)
Elgie Collard, PA-C  Thompson Grayer, RN5 hours ago (11:33 AM)   Anytime a patient is off Eliquis, they assume the risk associated with being off a blood thinner regardless of symptoms (some patients in Afib are asymptomatic). I have included her risk of stroke below (approx 4.8%). If she wants to delay her surgery because she is concerned about her palpitations that is up to her and her doctor doing her endoscopy. We can certainly get her an appointment with one of our EP doctors to further discuss if she is interested.  CHA2DS2-VASc Score = 4 This indicates a 4.8% annual risk of stroke. The patient's score is based upon: CHF History: 0 HTN History: 1 Diabetes History: 0 Stroke History: 0 Vascular Disease History: 1 Age Score: 1 Gender Score: 1  Elgie Collard, PA-C   Called and spoke with patient who verbalized understanding of Sheila Bruce's comments. She states she feels she is "a little early in the game to have to see EP or discuss procedures to treat A-fib." She will continue taking the Eliquis and call us if these episodes of A-fib worsen in frequency or duration as she expressed she was symptomatic with the 3/17 episode.

## 2023-01-09 DIAGNOSIS — Z0000 Encounter for general adult medical examination without abnormal findings: Secondary | ICD-10-CM | POA: Diagnosis not present

## 2023-01-17 DIAGNOSIS — K2970 Gastritis, unspecified, without bleeding: Secondary | ICD-10-CM | POA: Diagnosis not present

## 2023-01-17 DIAGNOSIS — K2930 Chronic superficial gastritis without bleeding: Secondary | ICD-10-CM | POA: Diagnosis not present

## 2023-01-17 DIAGNOSIS — R1310 Dysphagia, unspecified: Secondary | ICD-10-CM | POA: Diagnosis not present

## 2023-01-17 DIAGNOSIS — K209 Esophagitis, unspecified without bleeding: Secondary | ICD-10-CM | POA: Diagnosis not present

## 2023-01-17 DIAGNOSIS — K31A14 Gastric intestinal metaplasia without dysplasia, involving the cardia: Secondary | ICD-10-CM | POA: Diagnosis not present

## 2023-01-17 DIAGNOSIS — K2940 Chronic atrophic gastritis without bleeding: Secondary | ICD-10-CM | POA: Diagnosis not present

## 2023-01-17 DIAGNOSIS — K2289 Other specified disease of esophagus: Secondary | ICD-10-CM | POA: Diagnosis not present

## 2023-01-17 DIAGNOSIS — K219 Gastro-esophageal reflux disease without esophagitis: Secondary | ICD-10-CM | POA: Diagnosis not present

## 2023-01-18 DIAGNOSIS — I700 Atherosclerosis of aorta: Secondary | ICD-10-CM | POA: Diagnosis not present

## 2023-01-18 DIAGNOSIS — Z87891 Personal history of nicotine dependence: Secondary | ICD-10-CM | POA: Diagnosis not present

## 2023-01-18 DIAGNOSIS — I10 Essential (primary) hypertension: Secondary | ICD-10-CM | POA: Diagnosis not present

## 2023-01-18 DIAGNOSIS — R7303 Prediabetes: Secondary | ICD-10-CM | POA: Diagnosis not present

## 2023-01-18 DIAGNOSIS — D6869 Other thrombophilia: Secondary | ICD-10-CM | POA: Diagnosis not present

## 2023-01-18 DIAGNOSIS — I480 Paroxysmal atrial fibrillation: Secondary | ICD-10-CM | POA: Diagnosis not present

## 2023-01-23 DIAGNOSIS — K2940 Chronic atrophic gastritis without bleeding: Secondary | ICD-10-CM | POA: Diagnosis not present

## 2023-01-23 DIAGNOSIS — K2289 Other specified disease of esophagus: Secondary | ICD-10-CM | POA: Diagnosis not present

## 2023-01-23 DIAGNOSIS — K219 Gastro-esophageal reflux disease without esophagitis: Secondary | ICD-10-CM | POA: Diagnosis not present

## 2023-01-26 DIAGNOSIS — N390 Urinary tract infection, site not specified: Secondary | ICD-10-CM | POA: Diagnosis not present

## 2023-02-07 DIAGNOSIS — R300 Dysuria: Secondary | ICD-10-CM | POA: Diagnosis not present

## 2023-02-20 DIAGNOSIS — K31A0 Gastric intestinal metaplasia, unspecified: Secondary | ICD-10-CM | POA: Diagnosis not present

## 2023-03-13 DIAGNOSIS — N3020 Other chronic cystitis without hematuria: Secondary | ICD-10-CM | POA: Diagnosis not present

## 2023-03-27 ENCOUNTER — Other Ambulatory Visit: Payer: Self-pay | Admitting: Family Medicine

## 2023-03-27 DIAGNOSIS — Z1231 Encounter for screening mammogram for malignant neoplasm of breast: Secondary | ICD-10-CM

## 2023-04-04 DIAGNOSIS — M79674 Pain in right toe(s): Secondary | ICD-10-CM | POA: Diagnosis not present

## 2023-04-15 ENCOUNTER — Ambulatory Visit: Payer: Medicare Other | Attending: Cardiovascular Disease | Admitting: Cardiovascular Disease

## 2023-04-15 ENCOUNTER — Encounter: Payer: Self-pay | Admitting: Cardiovascular Disease

## 2023-04-15 VITALS — BP 152/76 | HR 54 | Ht 60.0 in | Wt 148.0 lb

## 2023-04-15 DIAGNOSIS — I480 Paroxysmal atrial fibrillation: Secondary | ICD-10-CM

## 2023-04-15 DIAGNOSIS — E785 Hyperlipidemia, unspecified: Secondary | ICD-10-CM

## 2023-04-15 DIAGNOSIS — I10 Essential (primary) hypertension: Secondary | ICD-10-CM | POA: Diagnosis not present

## 2023-04-15 DIAGNOSIS — I2510 Atherosclerotic heart disease of native coronary artery without angina pectoris: Secondary | ICD-10-CM | POA: Diagnosis not present

## 2023-04-15 NOTE — Patient Instructions (Signed)
Medication Instructions:  Your physician recommends that you continue on your current medications as directed. Please refer to the Current Medication list given to you today.  *If you need a refill on your cardiac medications before your next appointment, please call your pharmacy*   Lab Work: NONE If you have labs (blood work) drawn today and your tests are completely normal, you will receive your results only by: MyChart Message (if you have MyChart) OR A paper copy in the mail If you have any lab test that is abnormal or we need to change your treatment, we will call you to review the results.   Testing/Procedures: **Please monitor BP at home 2-3x/wk and call us if trending higher than 140/16mmhg**   Follow-Up: At [facility], you and your health needs are our priority.  As part of our continuing mission to provide you with exceptional heart care, we have created designated Provider Care Teams.  These Care Teams include your primary Cardiologist (physician) and Advanced Practice Providers (APPs -  Physician Assistants and Nurse Practitioners) who all work together to provide you with the care you need, when you need it.  Your next appointment:   6 month(s)  Provider:   Jari Favre, PA-C, Ronie Spies, PA-C, Robin Searing, NP, Eligha Bridegroom, NP, Tereso Newcomer, PA-C, or Perlie Gold, PA-C     Then, Tonny Bollman, MD will plan to see you again in 1 year(s).

## 2023-04-15 NOTE — Progress Notes (Signed)
Cardiology Office Note:    Date:  04/15/2023   ID:  Sheila Bruce, DOB Feb 01, 1946, MRN [id number]  PCP:  Sigmund Hazel, MD   [facility] Providers Cardiologist:  Tonny Bollman, MD     Referring MD: Sigmund Hazel, MD   Chief Complaint  Patient presents with   Atrial Fibrillation    History of Present Illness:    Sheila Bruce is a 77 y.o. female presenting for follow-up of atrial fibrillation.  The patient is maintained on apixaban for oral anticoagulation in she is treated with a combination of metoprolol and diltiazem.  The patient has nonobstructive CAD and underwent coronary CTA in 2023 confirming no high-grade coronary obstructive disease.  In February of this year, she developed an episode of symptomatic atrial fibrillation with RVR.  She converted spontaneously back to sinus rhythm and metoprolol was added to her outpatient regimen.  An echocardiogram showed preserved LVEF with grade 1 diastolic dysfunction.  She was seen back in March of this year and was felt to be clinically stable at that time.  She presents today for cardiology follow-up.  The patient is here alone today.  She feels well.  She denies any recent problems with heart palpitations, chest pain, or shortness of breath.  No leg edema, orthopnea, or PND.  Past Medical History:  Diagnosis Date   Anxiety    Habitual alcohol use    Heart murmur    Hx of cardiovascular stress test    a. ETT-Myoview 5/14:  Normal; EF 79%, no ischemia, exercised 10:00.   Hyperlipidemia    Hypertension    PAF (paroxysmal atrial fibrillation) (HCC)    a. Echo 4/14: Mild concentric LVH, EF 60-65%, grade 1 diastolic dysfunction, trivial MR, PASP 29;  b. Eliquis 5 bid   Vertigo     Past Surgical History:  Procedure Laterality Date   ABDOMINAL HYSTERECTOMY     BREAST EXCISIONAL BIOPSY     CYSTOSCOPY     Laproscopy-Abdominal     TENDON REPAIR     Left hand    Current Medications: Current Meds  Medication Sig    acetaminophen (TYLENOL 8 HOUR) 650 MG CR tablet Take 1,300 mg by mouth every 8 (eight) hours as needed for pain.   apixaban (ELIQUIS) 5 MG TABS tablet Take 1 tablet (5 mg total) by mouth 2 (two) times daily.   atorvastatin (LIPITOR) 80 MG tablet Take 1 tablet (80 mg total) by mouth daily.   BIOTIN PO Take by mouth.   Cholecalciferol (VITAMIN D3) 50 MCG (2000 UT) CAPS Take 1 capsule by mouth daily.   D-MANNOSE PO Take 1 tablet by mouth daily.   diltiazem (CARDIZEM CD) 240 MG 24 hr capsule Take 240 mg by mouth daily.   estradiol (ESTRACE) 0.1 MG/GM vaginal cream Place 1 Applicatorful vaginally 2 (two) times a week.   famotidine (PEPCID) 20 MG tablet Take 20 mg by mouth daily.   fluticasone (FLONASE) 50 MCG/ACT nasal spray Place 2 sprays into the nose daily.   hydrOXYzine (ATARAX) 10 MG tablet Take 10 mg by mouth every 6 (six) hours as needed for anxiety.   loratadine (CLARITIN) 10 MG tablet Take 10 mg by mouth daily.   metoprolol tartrate (LOPRESSOR) 25 MG tablet Take 1 tablet (25 mg total) by mouth 2 (two) times daily.   Multiple Vitamin (MULTIVITAMIN) tablet Take 1 tablet by mouth daily.   Omega-3 Fatty Acids (FISH OIL) 500 MG CAPS Take by mouth daily. Per patient taking 1200  mg everyday   vitamin E 180 MG (400 UNITS) capsule 1 capsule     Allergies:   Ciprocinonide [fluocinolone], Codeine, Ciprofloxacin, and Doxycycline   Social History   Socioeconomic History   Marital status: Married    Spouse name: Not on file   Number of children: Not on file   Years of education: Not on file   Highest education level: Not on file  Occupational History   Not on file  Tobacco Use   Smoking status: Former    Types: Cigarettes    Quit date: 01/12/2005    Years since quitting: 18.2   Smokeless tobacco: Never   Tobacco comments:    Smoked for 35 yrs - As of 08/2015  chews nicotine gum  Substance and Sexual Activity   Alcohol use: Not Currently    Alcohol/week: 1.0 standard drink of alcohol     Types: 1 Glasses of wine per week    Comment: Occasional use now - less than one per day during week, Occasional 2-3 on the weekend.   Drug use: No   Sexual activity: Never  Other Topics Concern   Not on file  Social History Narrative   Not on file   Social Determinants of Health   Financial Resource Strain: Not on file  Food Insecurity: No Food Insecurity (12/03/2022)   Hunger Vital Sign    Worried About Running Out of Food in the Last Year: Never true    Ran Out of Food in the Last Year: Never true  Transportation Needs: No Transportation Needs (12/03/2022)   PRAPARE - Administrator, Civil Service (Medical): No    Lack of Transportation (Non-Medical): No  Physical Activity: Not on file  Stress: Not on file  Social Connections: Not on file     Family History: The patient's family history includes Autoimmune disease in an other family member; Breast cancer in her maternal grandmother and paternal grandmother; CAD in some other family members; Heart disease (age of onset: 94) in her father; High Cholesterol in her father; Hyperlipidemia in an other family member; Hypertension in an other family member; Peripheral vascular disease in her mother.  ROS:   Please see the history of present illness.    All other systems reviewed and are negative.  EKGs/Labs/Other Studies Reviewed:    The following studies were reviewed today: Cardiac Studies & Procedures       ECHOCARDIOGRAM  ECHOCARDIOGRAM COMPLETE 12/04/2022  Narrative ECHOCARDIOGRAM REPORT    Patient Name:   Sheila Bruce Date of Exam: 12/04/2022 Medical Rec #:  [id number]        Height:       60.0 in Accession #:    [id number]       Weight:       146.8 lb Date of Birth:  1946-09-13        BSA:          1.637 m Patient Age:    76 years         BP:           122/55 mmHg Patient Gender: F                HR:           63 bpm. Exam Location:  Inpatient  Procedure: 2D Echo, Cardiac Doppler and Color  Doppler  Indications:    I48.91* Unspeicified atrial fibrillation  History:        Patient has prior history  of Echocardiogram examinations, most recent 08/21/2021. Abnormal ECG, Arrythmias:Atrial Fibrillation, Signs/Symptoms:Dizziness/Lightheadedness, Shortness of Breath and Dyspnea; Risk Factors:Hypertension, Dyslipidemia and Former Smoker.  Sonographer:    Sheralyn Boatman RDCS Referring Phys: [id number] DAVID MANUEL ORTIZ  IMPRESSIONS   1. Left ventricular ejection fraction, by estimation, is 60 to 65%. The left ventricle has normal function. The left ventricle has no regional wall motion abnormalities. Left ventricular diastolic parameters are consistent with Grade I diastolic dysfunction (impaired relaxation). 2. Right ventricular systolic function is normal. The right ventricular size is normal. There is normal pulmonary artery systolic pressure. The estimated right ventricular systolic pressure is 28.6 mmHg. 3. The mitral valve is normal in structure. Mild mitral valve regurgitation. No evidence of mitral stenosis. 4. The aortic valve is normal in structure. Aortic valve regurgitation is not visualized. No aortic stenosis is present. 5. The inferior vena cava is normal in size with greater than 50% respiratory variability, suggesting right atrial pressure of 3 mmHg.  Comparison(s): No significant change from prior study.  FINDINGS Left Ventricle: Left ventricular ejection fraction, by estimation, is 60 to 65%. The left ventricle has normal function. The left ventricle has no regional wall motion abnormalities. The left ventricular internal cavity size was normal in size. There is no left ventricular hypertrophy. Left ventricular diastolic parameters are consistent with Grade I diastolic dysfunction (impaired relaxation). Indeterminate filling pressures.  Right Ventricle: The right ventricular size is normal. No increase in right ventricular wall thickness. Right ventricular systolic  function is normal. There is normal pulmonary artery systolic pressure. The tricuspid regurgitant velocity is 2.53 m/s, and with an assumed right atrial pressure of 3 mmHg, the estimated right ventricular systolic pressure is 28.6 mmHg.  Left Atrium: Left atrial size was normal in size.  Right Atrium: Right atrial size was normal in size.  Pericardium: There is no evidence of pericardial effusion.  Mitral Valve: The mitral valve is normal in structure. Mild mitral valve regurgitation, with centrally-directed jet. No evidence of mitral valve stenosis.  Tricuspid Valve: The tricuspid valve is normal in structure. Tricuspid valve regurgitation is not demonstrated. No evidence of tricuspid stenosis.  Aortic Valve: The aortic valve is normal in structure. Aortic valve regurgitation is not visualized. No aortic stenosis is present.  Pulmonic Valve: The pulmonic valve was normal in structure. Pulmonic valve regurgitation is not visualized. No evidence of pulmonic stenosis.  Aorta: The aortic root is normal in size and structure.  Venous: The inferior vena cava is normal in size with greater than 50% respiratory variability, suggesting right atrial pressure of 3 mmHg.  IAS/Shunts: No atrial level shunt detected by color flow Doppler.   LEFT VENTRICLE PLAX 2D LVIDd:         3.60 cm     Diastology LVIDs:         2.30 cm     LV e' medial:    6.74 cm/s LV PW:         1.05 cm     LV E/e' medial:  13.4 LV IVS:        1.05 cm     LV e' lateral:   7.51 cm/s LVOT diam:     1.80 cm     LV E/e' lateral: 12.0 LV SV:         79 LV SV Index:   48 LVOT Area:     2.54 cm  LV Volumes (MOD) LV vol d, MOD A2C: 42.1 ml LV vol d, MOD A4C: 54.2 ml LV vol  s, MOD A2C: 15.3 ml LV vol s, MOD A4C: 22.1 ml LV SV MOD A2C:     26.8 ml LV SV MOD A4C:     54.2 ml LV SV MOD BP:      30.9 ml  RIGHT VENTRICLE            IVC RV S prime:     9.14 cm/s  IVC diam: 1.70 cm TAPSE (M-mode): 1.9 cm  LEFT ATRIUM            Index        RIGHT ATRIUM           Index LA diam:      2.50 cm 1.53 cm/m   RA Area:     12.10 cm LA Vol (A2C): 23.1 ml 14.11 ml/m  RA Volume:   28.00 ml  17.11 ml/m LA Vol (A4C): 28.7 ml 17.53 ml/m AORTIC VALVE LVOT Vmax:   131.00 cm/s LVOT Vmean:  85.900 cm/s LVOT VTI:    0.310 m  AORTA Ao Root diam: 2.90 cm Ao Asc diam:  3.15 cm  MITRAL VALVE                TRICUSPID VALVE MV Area (PHT): 3.31 cm     TR Peak grad:   25.6 mmHg MV Decel Time: 229 msec     TR Vmax:        253.00 cm/s MV E velocity: 90.30 cm/s MV A velocity: 117.00 cm/s  SHUNTS MV E/A ratio:  0.77         Systemic VTI:  0.31 m Systemic Diam: 1.80 cm  Rachelle Hora Croitoru MD Electronically signed by Thurmon Fair MD Signature Date/Time: 12/04/2022/9:10:39 AM    Final     CT SCANS  CT CORONARY MORPH W/CTA COR W/SCORE 12/11/2021  Addendum 12/11/2021  5:09 PM ADDENDUM REPORT: 12/11/2021 17:06  HISTORY: 77 yo female with chest pain/anginal equiv, high CAD risk, treadmill candidate  EXAM: Cardiac/Coronary CTA  TECHNIQUE: The patient was scanned on a [employer].  PROTOCOL: A 120 kV prospective scan was triggered in the descending thoracic aorta at 111 HU's. Axial non-contrast 3 mm slices were carried out through the heart. The data set was analyzed on a dedicated work station and scored using the Agatson method. Gantry rotation speed was 250 msecs and collimation was .6 mm. Beta blockade and 0.8 mg of sl NTG was given. The 3D data set was reconstructed in 5% intervals of the 35-75 % of the R-R cycle. Diastolic phases were analyzed on a dedicated work station using MPR, MIP and VRT modes. The patient received 95mL OMNIPAQUE IOHEXOL 350 MG/ML SOLN of contrast.  FINDINGS: Quality: Good, HR 63  Coronary calcium score: The patient's coronary artery calcium score is 207, which places the patient in the 76th percentile.  Coronary arteries: Normal coronary origins.  Right dominance.  Right  Coronary Artery: Dominant.  No significant disease.  Left Main Coronary Artery: Normal. Bifurcates into the LAD and LCx arteries.  Left Anterior Descending Coronary Artery: Anterior artery that wraps around the apex. There is mild 25-49% mixed proximal stenosis (CADRADS2). 2 small diagonal branches without disease.  Left Circumflex Artery: AV groove vessel - no disease.  Aorta: Normal size, 31 mm at the mid ascending aorta (level of the PA bifurcation) measured double oblique. Aortic atherosclerosis. No dissection.  Aortic Valve: Trileaflet. No calcifications.  Other findings:  Normal pulmonary vein drainage into the left atrium.  Normal left atrial appendage without  a thrombus.  Normal size of the pulmonary artery.  IMPRESSION: 1. 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  2. Coronary calcium score of 207. This was 76th percentile for age and sex matched control.  3. Normal coronary origin with right dominance.  4. Aortic atherosclerosis.  5. Aggressive cardiovascular risk factor modification is recommended.   Electronically Signed By: Chrystie Nose M.D. On: 12/11/2021 17:06  Narrative EXAM: OVER-READ INTERPRETATION  CT CHEST  The following report is an over-read performed by radiologist Dr. Jeronimo Greaves of [facility] on 12/11/2021. This over-read does not include interpretation of cardiac or coronary anatomy or pathology. The coronary CTA interpretation by the cardiologist is attached.  COMPARISON:  10/27/2021 chest radiograph.  CT chest 05/13/2020.  FINDINGS: Vascular: Aortic atherosclerosis. No central pulmonary embolism, on this non-dedicated study.  Mediastinum/Nodes: No imaged thoracic adenopathy. Tiny hiatal hernia.  Lungs/Pleura: No pleural fluid.  Clear imaged lungs.  Upper Abdomen: Normal imaged portions of the liver, spleen.  Musculoskeletal: Thoracic spondylosis.  IMPRESSION: 1.  No acute findings in the imaged  extracardiac chest. 2.  Aortic Atherosclerosis (ICD10-I70.0). 3.  Tiny hiatal hernia.  Electronically Signed: By: Jeronimo Greaves M.D. On: 12/11/2021 15:50               Recent Labs: 12/03/2022: Magnesium 2.4; TSH 0.553 12/04/2022: ALT 31; BUN 27; Creatinine, Ser 0.94; Hemoglobin 12.8; Platelets 211; Potassium 3.5; Sodium 138  Recent Lipid Panel    Component Value Date/Time   CHOL 153 06/15/2022 0824   TRIG 135 06/15/2022 0824   HDL 56 06/15/2022 0824   CHOLHDL 2.7 06/15/2022 0824   CHOLHDL 2.5 04/01/2010 0445   VLDL 23 04/01/2010 0445   LDLCALC 74 06/15/2022 0824     Risk Assessment/Calculations:    CHA2DS2-VASc Score = 4   This indicates a 4.8% annual risk of stroke. The patient's score is based upon: CHF History: 0 HTN History: 1 Diabetes History: 0 Stroke History: 0 Vascular Disease History: 1 Age Score: 1 Gender Score: 1          Physical Exam:    VS:  BP (!) 152/76   Pulse (!) 54   Ht 5' (1.524 m)   Wt 148 lb (67.1 kg)   SpO2 95%   BMI 28.90 kg/m     Wt Readings from Last 3 Encounters:  04/15/23 148 lb (67.1 kg)  12/18/22 146 lb (66.2 kg)  12/03/22 146 lb 13.2 oz (66.6 kg)     GEN:  Well nourished, well developed in no acute distress HEENT: Normal NECK: No JVD; No carotid bruits LYMPHATICS: No lymphadenopathy CARDIAC: RRR, no murmurs, rubs, gallops RESPIRATORY:  Clear to auscultation without rales, wheezing or rhonchi  ABDOMEN: Soft, non-tender, non-distended MUSCULOSKELETAL:  No edema; No deformity  SKIN: Warm and dry NEUROLOGIC:  Alert and oriented x 3 PSYCHIATRIC:  Normal affect   ASSESSMENT:    1. PAF (paroxysmal atrial fibrillation) (HCC)   2. Essential hypertension   3. Coronary artery disease involving native coronary artery of native heart without angina pectoris   4. Hyperlipidemia LDL goal <70    PLAN:    In order of problems listed above:  Maintaining sinus rhythm on diltiazem and metoprolol, anticoagulated with apixaban  without bleeding problems.  Continue current management.  If she has recurrent atrial fibrillation associated with symptoms, will need to consider antiarrhythmic drug therapy and/or EP referral.  Patient understands. Blood pressure is above goal.  She is on diltiazem and metoprolol.  She would not tolerate any further AV nodal blockade due to her bradycardia.  I asked her to monitor home blood pressures and we gave her parameters to call in if her readings are elevated and greater than 140/90 mmHg.  She appears to have a component of whitecoat hypertension and states that her home readings are significantly better than today's numbers. Treated with a high intensity statin drug (atorvastatin 80 mg).  Last LDL cholesterol was 74 mg/dL.  Continue to work on lifestyle modification.  Continue current management.           Medication Adjustments/Labs and Tests Ordered: Current medicines are reviewed at length with the patient today.  Concerns regarding medicines are outlined above.  No orders of the defined types were placed in this encounter.  No orders of the defined types were placed in this encounter.   Patient Instructions  Medication Instructions:  Your physician recommends that you continue on your current medications as directed. Please refer to the Current Medication list given to you today.  *If you need a refill on your cardiac medications before your next appointment, please call your pharmacy*   Lab Work: NONE If you have labs (blood work) drawn today and your tests are completely normal, you will receive your results only by: MyChart Message (if you have MyChart) OR A paper copy in the mail If you have any lab test that is abnormal or we need to change your treatment, we will call you to review the results.   Testing/Procedures: **Please monitor BP at home 2-3x/wk and call us if trending higher than 140/78mmhg**   Follow-Up: At [facility], you and your health  needs are our priority.  As part of our continuing mission to provide you with exceptional heart care, we have created designated Provider Care Teams.  These Care Teams include your primary Cardiologist (physician) and Advanced Practice Providers (APPs -  Physician Assistants and Nurse Practitioners) who all work together to provide you with the care you need, when you need it.  Your next appointment:   6 month(s)  Provider:   Jari Favre, PA-C, Ronie Spies, PA-C, Robin Searing, NP, Eligha Bridegroom, NP, Tereso Newcomer, PA-C, or Perlie Gold, PA-C     Then, Tonny Bollman, MD will plan to see you again in 1 year(s).       Signed, Tonny Bollman, MD  04/15/2023 1:38 PM    [facility]

## 2023-04-22 ENCOUNTER — Other Ambulatory Visit: Payer: Self-pay | Admitting: Physician Assistant

## 2023-05-03 ENCOUNTER — Ambulatory Visit
Admission: RE | Admit: 2023-05-03 | Discharge: 2023-05-03 | Disposition: A | Discharge status: Home / Self Care | Payer: Medicare Other | Source: Ambulatory Visit | Attending: Family Medicine | Admitting: Family Medicine

## 2023-05-03 DIAGNOSIS — Z1231 Encounter for screening mammogram for malignant neoplasm of breast: Secondary | ICD-10-CM | POA: Diagnosis not present

## 2023-06-12 DIAGNOSIS — N3020 Other chronic cystitis without hematuria: Secondary | ICD-10-CM | POA: Diagnosis not present

## 2023-06-22 ENCOUNTER — Other Ambulatory Visit: Payer: Self-pay | Admitting: Physician Assistant

## 2023-06-24 DIAGNOSIS — N3000 Acute cystitis without hematuria: Secondary | ICD-10-CM | POA: Diagnosis not present

## 2023-07-06 DIAGNOSIS — R300 Dysuria: Secondary | ICD-10-CM | POA: Diagnosis not present

## 2023-07-06 DIAGNOSIS — N390 Urinary tract infection, site not specified: Secondary | ICD-10-CM | POA: Diagnosis not present

## 2023-07-17 DIAGNOSIS — M6281 Muscle weakness (generalized): Secondary | ICD-10-CM | POA: Diagnosis not present

## 2023-07-26 DIAGNOSIS — E559 Vitamin D deficiency, unspecified: Secondary | ICD-10-CM | POA: Diagnosis not present

## 2023-07-26 DIAGNOSIS — I10 Essential (primary) hypertension: Secondary | ICD-10-CM | POA: Diagnosis not present

## 2023-07-26 DIAGNOSIS — Z23 Encounter for immunization: Secondary | ICD-10-CM | POA: Diagnosis not present

## 2023-07-26 DIAGNOSIS — L659 Nonscarring hair loss, unspecified: Secondary | ICD-10-CM | POA: Diagnosis not present

## 2023-07-26 DIAGNOSIS — E7800 Pure hypercholesterolemia, unspecified: Secondary | ICD-10-CM | POA: Diagnosis not present

## 2023-07-26 DIAGNOSIS — I480 Paroxysmal atrial fibrillation: Secondary | ICD-10-CM | POA: Diagnosis not present

## 2023-07-26 DIAGNOSIS — R7303 Prediabetes: Secondary | ICD-10-CM | POA: Diagnosis not present

## 2023-08-16 DIAGNOSIS — H2513 Age-related nuclear cataract, bilateral: Secondary | ICD-10-CM | POA: Diagnosis not present

## 2023-09-02 DIAGNOSIS — M6281 Muscle weakness (generalized): Secondary | ICD-10-CM | POA: Diagnosis not present

## 2023-10-16 NOTE — Progress Notes (Signed)
 Office Visit    Patient Name: Sheila Bruce Date of Encounter: 10/17/2023  PCP:  Cleotilde Planas, MD   [facility]  Cardiologist:  Ozell Fell, MD  Advanced Practice Provider:  No care team member to display Electrophysiologist:  None   HPI    Sheila Bruce is a 78 y.o. female who is a retired [employer] from [employer] with a hx of paraoxysmal atrial fibrillation on Eliquis , mild aortic sclerosis, hypertension and mixed HLD, former smoking presents today for follow-up appointment.    The patient saw Dr. Fell 08/23/21 and was doing well. The plan was to repeat an echo in 3-4 years, he went to the ED for chest pain 10/26/2021 and troponins are negative.  EKG with no change.  Dr. Fell reviewed and said to do a Lexi scan if recurrent chest pain.  Coronary CTA with calcium  score was ordered 3/23 which was 207 (25 to 49% LAD stenosis).  Statin was increased.  Patient was seen 01/2022 and was doing well at that time.  Patient was last seen by Rosaline Pavy, PA-C on 09/2022 and at that time had stopped her HCTZ because of low blood pressure.  Occasional palpitations that lasted 15 seconds.  No chest pain or dyspnea with it.  No edema.  No bleeding problems on Eliquis .  No regular exercise.  She was doing gardening and walks with her dog.  Golfs regularly.  She was in the hospital for Afib RVR back on 2/26-2/27. Echo obtained.  She spontaneously converted to normal sinus rhythm.  Cardiology was consulted and metoprolol  was added.  Remained in normal sinus rhythm so was cleared for discharge.  Echocardiogram showed normal LVEF with grade 1 DD.  I saw the patient March 2024, she feels a lot better since her change in medications.  Blood pressure has been much better controlled.  Feeling better overall.  She has a little flutter now and then but it is gone a few seconds.  Nothing like what she experienced when she went to the hospital.  She is not short of breath but does have some  fatigue.  Her heart rate is low today at 48 bpm but she states that at home she is usually in the mid 50s to 60s.  We have asked her to continue to track her blood pressure and heart rate at home.  She has had a history of mild MR since she was a young girl (positive systolic murmur since then).  She remains very active especially with gardening and enjoys golfing.  She was then seen by Dr. Fell 04/15/2023.  She felt well at that visit without any issues.  No leg edema or shortness of breath.  Maintaining normal sinus rhythm on diltiazem  and metoprolol  with apixaban  as her anticoagulation.  Blood pressure was above goal which was thought to be a component of whitecoat syndrome.  She was asked to monitor her blood pressures at home.  Today, she presents with a history of AFib,for a routine follow-up. She reports a few instances over the past month where she felt 'unsettled' and experienced fatigue, dizziness, and shortness of breath. These symptoms were intermittent and have since resolved. The patient did not seek medical attention at the time as she did not experience a rapid heart rate. She also reports occasional feelings of being off balance, which she attributes to her diagnosed vertigo. The patient notes increased shortness of breath with exertion, such as climbing stairs, but does not attribute  this to her heart condition. Echo reviewed with the patient from a year ago.  The patient also reports a history of PVCs, which have not been present recently. She attributes this improvement to her current medication regimen of diltiazem  and metoprolol . The patient remains active, walking her dog daily for up to a mile, and enjoys golfing and gardening. She reports difficulty tolerating heat and prefers colder temperatures.  The patient expresses concern about ongoing symptoms of fatigue, hair loss, and weight gain, suspecting a possible thyroid  issue despite normal TSH levels. She reports a history of  elevated triglycerides, which have improved but remain slightly above the goal. She is currently taking fish oil for this condition.  She is concerned about symptoms like weight gain, hair loss, and decreased energy. She asked for a referral to endocrinology for a further workup.  Reports no chest pain, pressure, or tightness. No edema, orthopnea, PND.   Discussed the use of AI scribe software for clinical note transcription with the patient, who gave verbal consent to proceed.   Past Medical History    Past Medical History:  Diagnosis Date   Anxiety    Habitual alcohol use    Heart murmur    Hx of cardiovascular stress test    a. ETT-Myoview 5/14:  Normal; EF 79%, no ischemia, exercised 10:00.   Hyperlipidemia    Hypertension    PAF (paroxysmal atrial fibrillation) (HCC)    a. Echo 4/14: Mild concentric LVH, EF 60-65%, grade 1 diastolic dysfunction, trivial MR, PASP 29;  b. Eliquis  5 bid   Vertigo    Past Surgical History:  Procedure Laterality Date   ABDOMINAL HYSTERECTOMY     BREAST EXCISIONAL BIOPSY     CYSTOSCOPY     Laproscopy-Abdominal     TENDON REPAIR     Left hand    Allergies  Allergies  Allergen Reactions   Ciprocinonide [Fluocinolone] Rash   Codeine Itching   Ciprofloxacin    Doxycycline Nausea And Vomiting    EKGs/Labs/Other Studies Reviewed:   The following studies were reviewed today:  Coronary CTA 12/11/21  FINDINGS: Quality: Good, HR 63   Coronary calcium  score: The patient's coronary artery calcium  score is 207, which places the patient in the 76th percentile.   Coronary arteries: Normal coronary origins.  Right dominance.   Right Coronary Artery: Dominant.  No significant disease.   Left Main Coronary Artery: Normal. Bifurcates into the LAD and LCx arteries.   Left Anterior Descending Coronary Artery: Anterior artery that wraps around the apex. There is mild 25-49% mixed proximal stenosis (CADRADS2). 2 small diagonal branches without  disease.   Left Circumflex Artery: AV groove vessel - no disease.   Aorta: Normal size, 31 mm at the mid ascending aorta (level of the PA bifurcation) measured double oblique. Aortic atherosclerosis. No dissection.   Aortic Valve: Trileaflet. No calcifications.   Other findings:   Normal pulmonary vein drainage into the left atrium.   Normal left atrial appendage without a thrombus.   Normal size of the pulmonary artery.   IMPRESSION: 1. 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   2. Coronary calcium  score of 207. This was 76th percentile for age and sex matched control.   3. Normal coronary origin with right dominance.   4. Aortic atherosclerosis.   5. Aggressive cardiovascular risk factor modification is recommended.    EKG:  EKG is not ordered today.   Recent Labs: 12/03/2022: Magnesium 2.4; TSH 0.553 12/04/2022: ALT 31; BUN  27; Creatinine, Ser 0.94; Hemoglobin 12.8; Platelets 211; Potassium 3.5; Sodium 138  Recent Lipid Panel    Component Value Date/Time   CHOL 153 06/15/2022 0824   TRIG 135 06/15/2022 0824   HDL 56 06/15/2022 0824   CHOLHDL 2.7 06/15/2022 0824   CHOLHDL 2.5 04/01/2010 0445   VLDL 23 04/01/2010 0445   LDLCALC 74 06/15/2022 0824     Home Medications   Current Meds  Medication Sig   acetaminophen  (TYLENOL  8 HOUR) 650 MG CR tablet Take 1,300 mg by mouth every 8 (eight) hours as needed for pain.   apixaban  (ELIQUIS ) 5 MG TABS tablet Take 1 tablet (5 mg total) by mouth 2 (two) times daily.   BIOTIN PO Take by mouth.   Cholecalciferol (VITAMIN D3) 50 MCG (2000 UT) CAPS Take 1 capsule by mouth daily.   D-MANNOSE PO Take 1 tablet by mouth daily.   diltiazem  (CARDIZEM  CD) 240 MG 24 hr capsule Take 240 mg by mouth daily.   estradiol (ESTRACE) 0.1 MG/GM vaginal cream Place 1 Applicatorful vaginally 2 (two) times a week.   famotidine  (PEPCID ) 20 MG tablet Take 20 mg by mouth daily.   fluticasone (FLONASE) 50 MCG/ACT nasal spray Place 2  sprays into the nose daily.   hydrocortisone cream 1 % Apply 1 Application topically 2 (two) times daily.   loratadine  (CLARITIN ) 10 MG tablet Take 10 mg by mouth daily.   Minoxidil 5 % FOAM Apply topically.   Multiple Vitamin (MULTIVITAMIN) tablet Take 1 tablet by mouth daily.   nicotine  polacrilex (NICORETTE ) 4 MG gum Take 4 mg by mouth as needed for smoking cessation.   Omega-3 Fatty Acids (FISH OIL) 500 MG CAPS Take by mouth daily. Per patient taking 1200 mg everyday   vitamin E 180 MG (400 UNITS) capsule 1 capsule   [DISCONTINUED] atorvastatin  (LIPITOR) 80 MG tablet TAKE 1 TABLET BY MOUTH EVERY DAY   [DISCONTINUED] metoprolol  tartrate (LOPRESSOR ) 25 MG tablet TAKE 1 TABLET BY MOUTH TWICE A DAY     Review of Systems      All other systems reviewed and are otherwise negative except as noted above.  Physical Exam    VS:  BP 132/72 (BP Location: Right Arm, Patient Position: Sitting, Cuff Size: Normal)   Pulse (!) 56   Resp 16   Ht 5' (1.524 m)   Wt 156 lb 9.6 oz (71 kg)   SpO2 95%   BMI 30.58 kg/m  , BMI Body mass index is 30.58 kg/m.  Wt Readings from Last 3 Encounters:  10/17/23 156 lb 9.6 oz (71 kg)  04/15/23 148 lb (67.1 kg)  12/18/22 146 lb (66.2 kg)     GEN: Well nourished, well developed, in no acute distress. HEENT: normal. Neck: Supple, no JVD, carotid bruits, or masses. Cardiac: ]RRR, + systolic murmur, rubs, or gallops. No clubbing, cyanosis, edema.  Radials/PT 2+ and equal bilaterally.  Respiratory:  Respirations regular and unlabored, clear to auscultation bilaterally. GI: Soft, nontender, nondistended. MS: No deformity or atrophy. Skin: Warm and dry, no rash. Neuro:  Strength and sensation are intact. Psych: Normal affect.  Assessment & Plan    Atrial Fibrillation Patient reported feeling unsettled with intermittent dizziness and shortness of breath, possibly indicating episodes of AFib. No rapid heart rate reported. Currently on Diltiazem  240mg ,  Eliquis  5mg  twice daily, and Metoprolol  25mg  twice daily. Heart rate on the lower end of normal at 56 bpm. -Continue current medication regimen. -Monitor heart rate and symptoms. If symptoms of  low heart rate (dizziness, lightheadedness, fatigue) persist or worsen, consider reducing Metoprolol  dose.  Possible Hypothyroidism? Patient reported symptoms consistent with hypothyroidism (fatigue, hair loss, weight gain), but previous TSH tests have been normal. -Refer to Endocrinologist  for further evaluation.  Hyperlipidemia Slightly elevated triglycerides at 165. Currently on Atorvastatin  and 1200mg  Fish Oil. -Continue current medication and lifestyle modifications.  Mitral Valve Regurgitation Mild mitral valve leak noted on previous echocardiogram. No progression or symptoms reported. -Continue to monitor condition.  General Health Maintenance / Followup Plans -Refill Atorvastatin  and Metoprolol  prescriptions. -Schedule follow-up appointment with Dr. Wonda in six months (July 2025).   Disposition: Follow up 6 months with Ozell Wonda, MD or APP.  Signed, Orren LOISE Fabry, PA-C 10/17/2023, 12:37 PM [facility]

## 2023-10-17 ENCOUNTER — Ambulatory Visit: Payer: Medicare Other | Attending: Physician Assistant | Admitting: Physician Assistant

## 2023-10-17 ENCOUNTER — Encounter: Payer: Self-pay | Admitting: Physician Assistant

## 2023-10-17 VITALS — BP 132/72 | HR 56 | Resp 16 | Ht 60.0 in | Wt 156.6 lb

## 2023-10-17 DIAGNOSIS — I10 Essential (primary) hypertension: Secondary | ICD-10-CM | POA: Diagnosis not present

## 2023-10-17 DIAGNOSIS — I480 Paroxysmal atrial fibrillation: Secondary | ICD-10-CM | POA: Diagnosis not present

## 2023-10-17 DIAGNOSIS — E785 Hyperlipidemia, unspecified: Secondary | ICD-10-CM | POA: Diagnosis not present

## 2023-10-17 DIAGNOSIS — I2510 Atherosclerotic heart disease of native coronary artery without angina pectoris: Secondary | ICD-10-CM

## 2023-10-17 MED ORDER — METOPROLOL TARTRATE 25 MG PO TABS: 25.0000 mg | ORAL_TABLET | Freq: Two times a day (BID) | ORAL | 3 refills | Status: AC

## 2023-10-17 MED ORDER — ATORVASTATIN CALCIUM 80 MG PO TABS: 80.0000 mg | ORAL_TABLET | Freq: Every day | ORAL | 3 refills | Status: AC

## 2023-10-17 NOTE — Patient Instructions (Addendum)
 Medication Instructions:  No changes *If you need a refill on your cardiac medications before your next appointment, please call your pharmacy*   Lab Work: None  If you have labs (blood work) drawn today and your tests are completely normal, you will receive your results only by: MyChart Message (if you have MyChart) OR A paper copy in the mail If you have any lab test that is abnormal or we need to change your treatment, we will call you to review the results.   Testing/Procedures: None    Follow-Up: At [facility], you and your health needs are our priority.  As part of our continuing mission to provide you with exceptional heart care, we have created designated Provider Care Teams.  These Care Teams include your primary Cardiologist (physician) and Advanced Practice Providers (APPs -  Physician Assistants and Nurse Practitioners) who all work together to provide you with the care you need, when you need it.    Your next appointment:   6 month(s)  Provider:   Ozell Fell, MD     Other Instructions Referral placed to endocrinology

## 2023-11-05 DIAGNOSIS — R300 Dysuria: Secondary | ICD-10-CM | POA: Diagnosis not present

## 2023-11-05 DIAGNOSIS — N390 Urinary tract infection, site not specified: Secondary | ICD-10-CM | POA: Diagnosis not present

## 2023-12-09 ENCOUNTER — Other Ambulatory Visit: Payer: Medicare Other

## 2023-12-11 DIAGNOSIS — N3020 Other chronic cystitis without hematuria: Secondary | ICD-10-CM | POA: Diagnosis not present

## 2023-12-13 ENCOUNTER — Ambulatory Visit: Payer: Medicare Other | Admitting: "Endocrinology

## 2024-01-08 DIAGNOSIS — Z1211 Encounter for screening for malignant neoplasm of colon: Secondary | ICD-10-CM | POA: Diagnosis not present

## 2024-01-08 DIAGNOSIS — Z23 Encounter for immunization: Secondary | ICD-10-CM | POA: Diagnosis not present

## 2024-01-08 DIAGNOSIS — Z0000 Encounter for general adult medical examination without abnormal findings: Secondary | ICD-10-CM | POA: Diagnosis not present

## 2024-01-20 DIAGNOSIS — E7800 Pure hypercholesterolemia, unspecified: Secondary | ICD-10-CM | POA: Diagnosis not present

## 2024-01-20 DIAGNOSIS — Z1211 Encounter for screening for malignant neoplasm of colon: Secondary | ICD-10-CM | POA: Diagnosis not present

## 2024-01-20 DIAGNOSIS — I10 Essential (primary) hypertension: Secondary | ICD-10-CM | POA: Diagnosis not present

## 2024-01-20 DIAGNOSIS — I480 Paroxysmal atrial fibrillation: Secondary | ICD-10-CM | POA: Diagnosis not present

## 2024-01-20 DIAGNOSIS — Z23 Encounter for immunization: Secondary | ICD-10-CM | POA: Diagnosis not present

## 2024-01-20 DIAGNOSIS — R5383 Other fatigue: Secondary | ICD-10-CM | POA: Diagnosis not present

## 2024-01-20 DIAGNOSIS — R7303 Prediabetes: Secondary | ICD-10-CM | POA: Diagnosis not present

## 2024-02-05 ENCOUNTER — Telehealth (HOSPITAL_BASED_OUTPATIENT_CLINIC_OR_DEPARTMENT_OTHER): Payer: Self-pay | Admitting: *Deleted

## 2024-02-05 DIAGNOSIS — K31A0 Gastric intestinal metaplasia, unspecified: Secondary | ICD-10-CM | POA: Diagnosis not present

## 2024-02-05 DIAGNOSIS — Z7901 Long term (current) use of anticoagulants: Secondary | ICD-10-CM | POA: Diagnosis not present

## 2024-02-05 DIAGNOSIS — R195 Other fecal abnormalities: Secondary | ICD-10-CM | POA: Diagnosis not present

## 2024-02-05 DIAGNOSIS — Z01818 Encounter for other preprocedural examination: Secondary | ICD-10-CM

## 2024-02-05 DIAGNOSIS — I4891 Unspecified atrial fibrillation: Secondary | ICD-10-CM | POA: Diagnosis not present

## 2024-02-05 NOTE — Telephone Encounter (Signed)
   Pre-operative Risk Assessment    Patient Name: Sheila Bruce  DOB: 1946/09/02 MRN: [id number]   Date of last office visit: 10/17/2023 Date of next office visit: None  Request for Surgical Clearance    Procedure:  Colonoscopy/Endoscopy  Date of Surgery:  Clearance 03/04/24                                 Surgeon:  Tobin Forts Surgeon's Group or Practice Name:  [facility] Phone number:  [phone] Fax number:  [phone]   Type of Clearance Requested:   - Medical  - Pharmacy:  Hold Apixaban  (Eliquis ) 2 days prior   Type of Anesthesia:   Propofol   Additional requests/questions:    Signed, Lauris Port   02/05/2024, 1:24 PM

## 2024-02-11 ENCOUNTER — Telehealth: Payer: Self-pay

## 2024-02-11 NOTE — Telephone Encounter (Signed)
  Patient Consent for Virtual Visit        [name] has provided verbal consent on 02/11/2024 for a virtual visit (video or telephone).  Med rec and consent. Call patient at [phone].    CONSENT FOR VIRTUAL VISIT FOR:  Sheila Bruce  By participating in this virtual visit I agree to the following:  I hereby voluntarily request, consent and authorize [facility] and its employed or contracted physicians, physician assistants, nurse practitioners or other licensed health care professionals (the Practitioner), to provide me with telemedicine health care services (the "Services") as deemed necessary by the treating Practitioner. I acknowledge and consent to receive the Services by the Practitioner via telemedicine. I understand that the telemedicine visit will involve communicating with the Practitioner through live audiovisual communication technology and the disclosure of certain medical information by electronic transmission. I acknowledge that I have been given the opportunity to request an in-person assessment or other available alternative prior to the telemedicine visit and am voluntarily participating in the telemedicine visit.  I understand that I have the right to withhold or withdraw my consent to the use of telemedicine in the course of my care at any time, without affecting my right to future care or treatment, and that the Practitioner or I may terminate the telemedicine visit at any time. I understand that I have the right to inspect all information obtained and/or recorded in the course of the telemedicine visit and may receive copies of available information for a reasonable fee.  I understand that some of the potential risks of receiving the Services via telemedicine include:  Delay or interruption in medical evaluation due to technological equipment failure or disruption; Information transmitted may not be sufficient (e.g. poor resolution of images) to allow  for appropriate medical decision making by the Practitioner; and/or  In rare instances, security protocols could fail, causing a breach of personal health information.  Furthermore, I acknowledge that it is my responsibility to provide information about my medical history, conditions and care that is complete and accurate to the best of my ability. I acknowledge that Practitioner's advice, recommendations, and/or decision may be based on factors not within their control, such as incomplete or inaccurate data provided by me or distortions of diagnostic images or specimens that may result from electronic transmissions. I understand that the practice of medicine is not an exact science and that Practitioner makes no warranties or guarantees regarding treatment outcomes. I acknowledge that a copy of this consent can be made available to me via my patient portal [facility] MyChart), or I can request a printed copy by calling the office of [facility].    I understand that my insurance will be billed for this visit.   I have read or had this consent read to me. I understand the contents of this consent, which adequately explains the benefits and risks of the Services being provided via telemedicine.  I have been provided ample opportunity to ask questions regarding this consent and the Services and have had my questions answered to my satisfaction. I give my informed consent for the services to be provided through the use of telemedicine in my medical care

## 2024-02-11 NOTE — Telephone Encounter (Signed)
 Primary Cardiologist:Michael Arlester Ladd, MD   Preoperative team, please contact this patient and set up a phone call appointment for further preoperative risk assessment. Please obtain consent and complete medication review. Thank you for your help.   Patient has not had labs in > 1 year and will need CBC and BMET for final anticoagulation clearance.  I also confirmed the patient resides in the state of [place] . As per [facility] Medical Board telemedicine laws, the patient must reside in the state in which the provider is licensed.   Gerldine Koch, NP-C  02/11/2024, 3:46 PM [address], [address] [place], [place] [address] Office [phone] Fax [phone]

## 2024-02-11 NOTE — Telephone Encounter (Signed)
 Patient with diagnosis of atrial fibrillation on Eliquis  for anticoagulation.    Procedure:  Colonoscopy/Endoscopy   Date of Surgery:  Clearance 03/04/24          CHA2DS2-VASc Score = 5   This indicates a 7.2% annual risk of stroke. The patient's score is based upon: CHF History: 0 HTN History: 1 Diabetes History: 0 Stroke History: 0 Vascular Disease History: 1 Age Score: 2 Gender Score: 1   Has not had labs in > 1 year, will need CBC, BMET for final clearance  **This guidance is not considered finalized until pre-operative APP has relayed final recommendations.**

## 2024-02-11 NOTE — Telephone Encounter (Signed)
 Med rec and consent. Call patient at [phone]. Patient scheduled for 02/19/24

## 2024-02-19 ENCOUNTER — Ambulatory Visit

## 2024-02-19 ENCOUNTER — Telehealth: Payer: Self-pay | Admitting: Cardiovascular Disease

## 2024-02-19 DIAGNOSIS — Z01818 Encounter for other preprocedural examination: Secondary | ICD-10-CM | POA: Diagnosis not present

## 2024-02-19 NOTE — Telephone Encounter (Signed)
 Wilson, Jasmin B7 minutes ago (8:21 AM)   JW Patient returned Pre-op call.      Note   Ataya, Cacal "Marily Shows" [phone]  Sherryle Don B    I s/w the pt and did apologize the lab orders were not placed. Pt is agreeable to have labs done today or tomorrow at [facility]. Pt aware labs are not fasting. We rescheduled her tele for 02/24/24.

## 2024-02-19 NOTE — Telephone Encounter (Signed)
 Per preop APP Palmer Bobo, NP: Hey this ladies visit today needs to be rescheduled. Unable to give final clearance because we need updated CBC and BMET so pharmacist can give final medical clearance. She has not had updated CBC and BMET in greater than a year. See if you could please order a CBC and BMET for this patient and have her come get her labs drawn as soon as possible and reschedule virtual visit for probably a week.    I left a message for the pt to call back to discuss further. I did also leave details that she will need lab work BMET/CBC. These should had been ordered when the tele appt was scheduled. Left message for the pt that if she can get the labs done today or tomorrow, we can reschedule her tele Monday or Tues next week still in time for her procedure. Left message that per preop APP I will cancel the tele appt for today, though need her to call back to discuss the labs to be done.    I have placed and released the lab orders for [facility].

## 2024-02-19 NOTE — Addendum Note (Signed)
 Addended by: Ophelia Billet on: 02/19/2024 08:20 AM   Modules accepted: Orders

## 2024-02-19 NOTE — Telephone Encounter (Signed)
Patient returned Pre-op call. 

## 2024-02-19 NOTE — Progress Notes (Deleted)
 Virtual Visit via Telephone Note   Because of Sheila Bruce co-morbid illnesses, she is at least at moderate risk for complications without adequate follow up.  This format is felt to be most appropriate for this patient at this time.  Due to technical limitations with video connection (technology), today's appointment will be conducted as an audio only telehealth visit, and Sheila Bruce verbally agreed to proceed in this manner.   All issues noted in this document were discussed and addressed.  No physical exam could be performed with this format.  Evaluation Performed:  Preoperative cardiovascular risk assessment _____________   Date:  02/19/2024   Patient ID:  Sheila Bruce, DOB Sep 13, 1946, MRN [id number] Patient Location:  Home Provider location:   Office  Primary Care Provider:  Perley Bradley, MD Primary Cardiologist:  Arnoldo Lapping, MD  Chief Complaint / Patient Profile   78 y.o. y/o female with a h/o *** who is pending colonoscopy/endoscopy on 03/04/2024 with [facility] by Dr. Feliberto Hopping and presents today for telephonic preoperative cardiovascular risk assessment.  History of Present Illness    Sheila Bruce is a 78 y.o. female who presents via audio/video conferencing for a telehealth visit today.  Pt was last seen in cardiology clinic on *** by ***.  At that time Sheila Bruce was doing well ***.  The patient is now pending procedure as outlined above. Since her last visit, she ***  Past Medical History    Past Medical History:  Diagnosis Date   Anxiety    Habitual alcohol use    Heart murmur    Hx of cardiovascular stress test    a. ETT-Myoview 5/14:  Normal; EF 79%, no ischemia, exercised 10:00.   Hyperlipidemia    Hypertension    PAF (paroxysmal atrial fibrillation) (HCC)    a. Echo 4/14: Mild concentric LVH, EF 60-65%, grade 1 diastolic dysfunction, trivial MR, PASP 29;  b. Eliquis  5 bid   Vertigo    Past Surgical History:  Procedure Laterality Date    ABDOMINAL HYSTERECTOMY     BREAST EXCISIONAL BIOPSY     CYSTOSCOPY     Laproscopy-Abdominal     TENDON REPAIR     Left hand    Allergies  Allergies  Allergen Reactions   Ciprocinonide [Fluocinolone] Rash   Codeine Itching   Ciprofloxacin    Doxycycline Nausea And Vomiting    Home Medications    Prior to Admission medications   Medication Sig Start Date End Date Taking? Authorizing Provider  acetaminophen  (TYLENOL  8 HOUR) 650 MG CR tablet Take 1,300 mg by mouth every 8 (eight) hours as needed for pain.    [provider]  apixaban  (ELIQUIS ) 5 MG TABS tablet Take 1 tablet (5 mg total) by mouth 2 (two) times daily. 01/13/13   Akula, Vijaya, MD  atorvastatin  (LIPITOR) 80 MG tablet Take 1 tablet (80 mg total) by mouth daily. 10/17/23   Von Grumbling, PA-C  Bacillus Coagulans-Inulin (PROBIOTIC) 1-250 BILLION-MG CAPS 1 cap Orally daily    [provider]  BIOTIN PO Take by mouth.    [provider]  Cholecalciferol (VITAMIN D3) 50 MCG (2000 UT) CAPS Take 1 capsule by mouth daily.    [provider]  D-MANNOSE PO Take 1 tablet by mouth daily.    [provider]  diltiazem  (CARDIZEM  CD) 240 MG 24 hr capsule Take 240 mg by mouth daily. 03/09/17   [provider]  estradiol (ESTRACE) 0.1 MG/GM vaginal  cream Place 1 Applicatorful vaginally 2 (two) times a week. 03/13/23   [provider]  famotidine  (PEPCID ) 20 MG tablet Take 20 mg by mouth daily. 10/31/21   [provider]  fluticasone (FLONASE) 50 MCG/ACT nasal spray Place 2 sprays into the nose daily.    [provider]  hydrocortisone cream 1 % Apply 1 Application topically 2 (two) times daily.    [provider]  loratadine  (CLARITIN ) 10 MG tablet Take 10 mg by mouth daily.    [provider]  metoprolol  tartrate (LOPRESSOR ) 25 MG tablet Take 1 tablet (25 mg total) by mouth 2 (two) times daily. 10/17/23   Conte, Tessa N, PA-C  Minoxidil 5 % FOAM  Apply topically.    [provider]  Multiple Vitamin (MULTIVITAMIN) tablet Take 1 tablet by mouth daily.    [provider]  nicotine  polacrilex (NICORETTE ) 4 MG gum Take 4 mg by mouth as needed for smoking cessation.    [provider]  Omega-3 Fatty Acids (FISH OIL) 500 MG CAPS Take by mouth daily. Per patient taking 1200 mg everyday    [provider]  vitamin E 180 MG (400 UNITS) capsule 1 capsule    [provider]    Physical Exam    Vital Signs:  Lekita Bruce does not have vital signs available for review today.***  Given telephonic nature of communication, physical exam is limited. AAOx3. NAD. Normal affect.  Speech and respirations are unlabored.  Accessory Clinical Findings    None  Assessment & Plan    1.  Preoperative Cardiovascular Risk Assessment:  The patient was advised that if she develops new symptoms prior to surgery to contact our office to arrange for a follow-up visit, and she verbalized understanding.  (Reminder: Include SBE prophylaxis/Antiplatelet/Anticoag Instructions***)  A copy of this note will be routed to requesting surgeon.  Time:   Today, I have spent *** minutes with the patient with telehealth technology discussing medical history, symptoms, and management plan.     Ava Boatman, NP  02/19/2024, 7:20 AM

## 2024-02-20 LAB — BASIC METABOLIC PANEL WITH GFR
BUN/Creatinine Ratio: 19 (ref 12–28)
BUN: 17 mg/dL (ref 8–27)
CO2: 23 mmol/L (ref 20–29)
Calcium: 10.6 mg/dL — ABNORMAL HIGH (ref 8.7–10.3)
Chloride: 102 mmol/L (ref 96–106)
Creatinine, Ser: 0.9 mg/dL (ref 0.57–1.00)
Glucose: 102 mg/dL — ABNORMAL HIGH (ref 70–99)
Potassium: 5 mmol/L (ref 3.5–5.2)
Sodium: 141 mmol/L (ref 134–144)
eGFR: 67 mL/min/{1.73_m2} (ref >59)

## 2024-02-20 LAB — CBC
Hematocrit: 47.3 % — ABNORMAL HIGH (ref 34.0–46.6)
Hemoglobin: 15.2 g/dL (ref 11.1–15.9)
MCH: 30.6 pg (ref 26.6–33.0)
MCHC: 32.1 g/dL (ref 31.5–35.7)
MCV: 95 fL (ref 79–97)
Platelets: 222 10*3/uL (ref 150–450)
RBC: 4.97 x10E6/uL (ref 3.77–5.28)
RDW: 13.1 % (ref 11.7–15.4)
WBC: 6 10*3/uL (ref 3.4–10.8)

## 2024-02-24 ENCOUNTER — Ambulatory Visit

## 2024-02-24 NOTE — Telephone Encounter (Signed)
 Update to PharmD clearance:  SCr 61 Plt  222  Okay for patient to hold Eliquis  for 2 days prior to scheduled colonoscopy/endoscopy

## 2024-02-26 ENCOUNTER — Ambulatory Visit: Attending: Cardiology | Admitting: Emergency Medicine

## 2024-02-26 DIAGNOSIS — Z01810 Encounter for preprocedural cardiovascular examination: Secondary | ICD-10-CM | POA: Diagnosis not present

## 2024-02-26 NOTE — Progress Notes (Signed)
 Virtual Visit via Telephone Note   Because of Aften Lipsey co-morbid illnesses, she is at least at moderate risk for complications without adequate follow up.  This format is felt to be most appropriate for this patient at this time.  Due to technical limitations with video connection (technology), today's appointment will be conducted as an audio only telehealth visit, and Sheila Bruce verbally agreed to proceed in this manner.   All issues noted in this document were discussed and addressed.  No physical exam could be performed with this format.  Evaluation Performed:  Preoperative cardiovascular risk assessment _____________   Date:  02/26/2024   Patient ID:  Sheila Bruce, DOB 04-28-1946, MRN [id number] Patient Location:  Home Provider location:   Office  Primary Care Provider:  Perley Bradley, MD Primary Cardiologist:  Arnoldo Lapping, MD  Chief Complaint / Patient Profile   78 y.o. y/o female with a h/o paroxysmal atrial fibrillation on Eliquis , moderate aortic sclerosis, hypertension, mixed hyperlipidemia, former smoker, mitral valve regurgitation who is pending colonoscopy/endoscopy on 03/04/2024 by [facility] with Dr. Feliberto Hopping and presents today for telephonic preoperative cardiovascular risk assessment.  History of Present Illness    Sheila Bruce is a 78 y.o. female who presents via audio/video conferencing for a telehealth visit today.  Pt was last seen in cardiology clinic on 10/17/2023 by Lovette Rud, PA.  At that time Sheila Bruce was doing well.  The patient is now pending procedure as outlined above. Since her last visit, she denies chest pain, shortness of breath, lower extremity edema, palpitations, melena, hematuria, hemoptysis, diaphoresis, weakness, presyncope, syncope, orthopnea, and PND.  Today patient is doing well overall.  She is without any acute cardiovascular concerns or complaints.  Denies any symptoms concerning for recurrent atrial fibrillation.   Denies any chest pain or exertional angina.  She stays relatively active golfs at least twice a week and cleans the home regularly.  She is able to walk up stairs and walk 1-2 blocks with ease.  Patient is easily able to complete greater than 4 METS.  Past Medical History    Past Medical History:  Diagnosis Date   Anxiety    Habitual alcohol use    Heart murmur    Hx of cardiovascular stress test    a. ETT-Myoview 5/14:  Normal; EF 79%, no ischemia, exercised 10:00.   Hyperlipidemia    Hypertension    PAF (paroxysmal atrial fibrillation) (HCC)    a. Echo 4/14: Mild concentric LVH, EF 60-65%, grade 1 diastolic dysfunction, trivial MR, PASP 29;  b. Eliquis  5 bid   Vertigo    Past Surgical History:  Procedure Laterality Date   ABDOMINAL HYSTERECTOMY     BREAST EXCISIONAL BIOPSY     CYSTOSCOPY     Laproscopy-Abdominal     TENDON REPAIR     Left hand    Allergies  Allergies  Allergen Reactions   Ciprocinonide [Fluocinolone] Rash   Codeine Itching   Ciprofloxacin    Doxycycline Nausea And Vomiting    Home Medications    Prior to Admission medications   Medication Sig Start Date End Date Taking? Authorizing Provider  acetaminophen  (TYLENOL  8 HOUR) 650 MG CR tablet Take 1,300 mg by mouth every 8 (eight) hours as needed for pain.    [provider]  apixaban  (ELIQUIS ) 5 MG TABS tablet Take 1 tablet (5 mg total) by mouth 2 (two) times daily. 01/13/13   Akula, Vijaya, MD  atorvastatin  (LIPITOR) 80  MG tablet Take 1 tablet (80 mg total) by mouth daily. 10/17/23   Von Grumbling, PA-C  Bacillus Coagulans-Inulin (PROBIOTIC) 1-250 BILLION-MG CAPS 1 cap Orally daily    [provider]  BIOTIN PO Take by mouth.    [provider]  Cholecalciferol (VITAMIN D3) 50 MCG (2000 UT) CAPS Take 1 capsule by mouth daily.    [provider]  D-MANNOSE PO Take 1 tablet by mouth daily.    [provider]  diltiazem  (CARDIZEM  CD) 240 MG 24 hr capsule Take 240  mg by mouth daily. 03/09/17   [provider]  estradiol (ESTRACE) 0.1 MG/GM vaginal cream Place 1 Applicatorful vaginally 2 (two) times a week. 03/13/23   [provider]  famotidine  (PEPCID ) 20 MG tablet Take 20 mg by mouth daily. 10/31/21   [provider]  fluticasone (FLONASE) 50 MCG/ACT nasal spray Place 2 sprays into the nose daily.    [provider]  hydrocortisone cream 1 % Apply 1 Application topically 2 (two) times daily.    [provider]  loratadine  (CLARITIN ) 10 MG tablet Take 10 mg by mouth daily.    [provider]  metoprolol  tartrate (LOPRESSOR ) 25 MG tablet Take 1 tablet (25 mg total) by mouth 2 (two) times daily. 10/17/23   Conte, Tessa N, PA-C  Minoxidil 5 % FOAM Apply topically.    [provider]  Multiple Vitamin (MULTIVITAMIN) tablet Take 1 tablet by mouth daily.    [provider]  nicotine  polacrilex (NICORETTE ) 4 MG gum Take 4 mg by mouth as needed for smoking cessation.    [provider]  Omega-3 Fatty Acids (FISH OIL) 500 MG CAPS Take by mouth daily. Per patient taking 1200 mg everyday    [provider]  vitamin E 180 MG (400 UNITS) capsule 1 capsule    [provider]    Physical Exam    Vital Signs:  Sheila Bruce does not have vital signs available for review today.  Given telephonic nature of communication, physical exam is limited. AAOx3. NAD. Normal affect.  Speech and respirations are unlabored.  Accessory Clinical Findings    None  Assessment & Plan    1.  Preoperative Cardiovascular Risk Assessment: According to the Revised Cardiac Risk Index (RCRI), her Perioperative Risk of Major Cardiac Event is (%): 0.4. Her Functional Capacity in METs is: 6.36 according to the Duke Activity Status Index (DASI). Therefore, based on ACC/AHA guidelines, patient would be at acceptable risk for the planned procedure without further cardiovascular testing.   The  patient was advised that if she develops new symptoms prior to surgery to contact our office to arrange for a follow-up visit, and she verbalized understanding.  Per office protocol, patient can hold Eliquis  for 2 days prior to scheduled colonoscopy/endoscopy   A copy of this note will be routed to requesting surgeon.  Time:   Today, I have spent 8 minutes with the patient with telehealth technology discussing medical history, symptoms, and management plan.     Ava Boatman, NP  02/26/2024, 9:51 AM

## 2024-03-04 DIAGNOSIS — D125 Benign neoplasm of sigmoid colon: Secondary | ICD-10-CM | POA: Diagnosis not present

## 2024-03-04 DIAGNOSIS — R195 Other fecal abnormalities: Secondary | ICD-10-CM | POA: Diagnosis not present

## 2024-03-04 DIAGNOSIS — D120 Benign neoplasm of cecum: Secondary | ICD-10-CM | POA: Diagnosis not present

## 2024-03-04 DIAGNOSIS — D124 Benign neoplasm of descending colon: Secondary | ICD-10-CM | POA: Diagnosis not present

## 2024-03-04 DIAGNOSIS — K2930 Chronic superficial gastritis without bleeding: Secondary | ICD-10-CM | POA: Diagnosis not present

## 2024-03-04 DIAGNOSIS — K31A0 Gastric intestinal metaplasia, unspecified: Secondary | ICD-10-CM | POA: Diagnosis not present

## 2024-03-04 DIAGNOSIS — Z1211 Encounter for screening for malignant neoplasm of colon: Secondary | ICD-10-CM | POA: Diagnosis not present

## 2024-03-04 DIAGNOSIS — K3189 Other diseases of stomach and duodenum: Secondary | ICD-10-CM | POA: Diagnosis not present

## 2024-03-06 DIAGNOSIS — D120 Benign neoplasm of cecum: Secondary | ICD-10-CM | POA: Diagnosis not present

## 2024-03-06 DIAGNOSIS — D124 Benign neoplasm of descending colon: Secondary | ICD-10-CM | POA: Diagnosis not present

## 2024-03-06 DIAGNOSIS — D125 Benign neoplasm of sigmoid colon: Secondary | ICD-10-CM | POA: Diagnosis not present

## 2024-03-06 DIAGNOSIS — K2930 Chronic superficial gastritis without bleeding: Secondary | ICD-10-CM | POA: Diagnosis not present

## 2024-03-31 DIAGNOSIS — L578 Other skin changes due to chronic exposure to nonionizing radiation: Secondary | ICD-10-CM | POA: Diagnosis not present

## 2024-03-31 DIAGNOSIS — L814 Other melanin hyperpigmentation: Secondary | ICD-10-CM | POA: Diagnosis not present

## 2024-03-31 DIAGNOSIS — L570 Actinic keratosis: Secondary | ICD-10-CM | POA: Diagnosis not present

## 2024-03-31 DIAGNOSIS — L821 Other seborrheic keratosis: Secondary | ICD-10-CM | POA: Diagnosis not present

## 2024-04-01 ENCOUNTER — Other Ambulatory Visit: Payer: Self-pay | Admitting: Family Medicine

## 2024-04-01 DIAGNOSIS — Z1231 Encounter for screening mammogram for malignant neoplasm of breast: Secondary | ICD-10-CM

## 2024-04-08 NOTE — Progress Notes (Signed)
 Cardiology Office Note:  .   Date:  04/13/2024  ID:  Norva Bowe, DOB 03-Jun-1946, MRN [id number] PCP: Cleotilde Planas, MD  [facility] Providers Cardiologist:  Ozell Fell, MD    History of Present Illness: .   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  Patient comes in for f/u. Has felt like she's had a few episodes of Afib-gets dizzy for about 10 min and a little sob. Takes her pulse and feels regular. She's prediabtic so thinks it could be that as well.BP can get up 150's in the evening. She checks it 1 hr after she takes her evening dose of metoprolol . She's not taking it 12 hrs apart. BP in 110's in am. She walks her dog 1/2 mile twice a day, golf 3 times a week and yard work.   ROS:    Studies Reviewed: SABRA         Prior CV Studies:    Echo 11/2022 IMPRESSIONS     1. Left ventricular ejection fraction, by estimation, is 60 to 65%. The  left ventricle has normal function. The left ventricle has no regional  wall motion abnormalities. Left ventricular diastolic parameters are  consistent with Grade I diastolic  dysfunction (impaired relaxation).   2. Right ventricular systolic function is normal. The right ventricular  size is normal. There is normal pulmonary artery systolic pressure. The  estimated right ventricular systolic pressure is 28.6 mmHg.   3. The mitral valve is normal in structure. Mild mitral valve  regurgitation. No evidence of mitral stenosis.   4. The aortic valve is normal in structure. Aortic valve regurgitation is  not visualized. No aortic stenosis is present.   5. The inferior vena cava is normal in size with greater than 50%  respiratory variability, suggesting right atrial pressure of 3 mmHg.   Comparison(s): No significant change from prior study.     Coronary CTA  12/11/21 FINDINGS: Quality: Good, HR 63   Coronary calcium  score: The patient's coronary artery calcium  score is 207, which places the patient in the 76th percentile.   Coronary arteries: Normal coronary origins.  Right dominance.   Right Coronary Artery: Dominant.  No significant disease.   Left Main Coronary Artery: Normal. Bifurcates into the LAD and LCx arteries.   Left Anterior Descending Coronary Artery: Anterior artery that wraps around the apex. There is mild 25-49% mixed proximal stenosis (CADRADS2). 2 small diagonal branches without disease.   Left Circumflex Artery: AV groove vessel - no disease.   Aorta: Normal size, 31 mm at the mid ascending aorta (level of the PA bifurcation) measured double oblique. Aortic atherosclerosis. No dissection.   Aortic Valve: Trileaflet. No calcifications.   Other findings:   Normal pulmonary vein drainage into the left atrium.   Normal left atrial appendage without a thrombus.   Normal size of the pulmonary artery.   IMPRESSION: 1. 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   2. Coronary calcium  score of 207. This was 76th percentile for age and sex matched control.   3. Normal coronary origin with right dominance.   4. Aortic atherosclerosis.   5. Aggressive cardiovascular risk factor modification is recommended.     Electronically Signed   By: Vinie JAYSON Maxcy M.D.   On: 12/11/2021 17:06  Risk Assessment/Calculations:    CHA2DS2-VASc Score = 5   This  indicates a 7.2% annual risk of stroke. The patient's score is based upon: CHF History: 0 HTN History: 1 Diabetes History: 0 Stroke History: 0 Vascular Disease History: 1 Age Score: 2 Gender Score: 1            Physical Exam:   VS:  BP (!) 114/50   Pulse (!) 51   Ht 5' (1.524 m)   Wt 145 lb (65.8 kg)   SpO2 97%   BMI 28.32 kg/m    Orhtostatics: No data found. Wt Readings from Last 3 Encounters:  04/13/24 145 lb (65.8 kg)  10/17/23 156 lb 9.6  oz (71 kg)  04/15/23 148 lb (67.1 kg)    GEN: Well nourished, well developed in no acute distress NECK: No JVD; No carotid bruits CARDIAC:  RRR, no murmurs, rubs, gallops RESPIRATORY:  Clear to auscultation without rales, wheezing or rhonchi  ABDOMEN: Soft, non-tender, non-distended EXTREMITIES:  No edema; No deformity   ASSESSMENT AND PLAN: .    Atrial Fibrillation -occasional palpitations but don't last long-will let us  know if it worsens and we can send a monitor -no bleeding on eliquis . -recent bmet and cbc stable 02/2024    Hyperlipidemia Slightly elevated triglycerides at 165. Currently on Atorvastatin  and 1200mg  Fish Oil. -Continue current medication and lifestyle modifications.   Mitral Valve Regurgitation Mild mitral valve leak noted on previous echocardiogram. No progression or symptoms reported. -Continue to monitor condition.          Dispo: f/u in 6 months.  Signed, Olivia Pavy, PA-C

## 2024-04-13 ENCOUNTER — Encounter: Payer: Self-pay | Admitting: Physician Assistant

## 2024-04-13 ENCOUNTER — Ambulatory Visit: Attending: Physician Assistant | Admitting: Physician Assistant

## 2024-04-13 VITALS — BP 114/50 | HR 51 | Ht 60.0 in | Wt 145.0 lb

## 2024-04-13 DIAGNOSIS — I340 Nonrheumatic mitral (valve) insufficiency: Secondary | ICD-10-CM

## 2024-04-13 DIAGNOSIS — E785 Hyperlipidemia, unspecified: Secondary | ICD-10-CM | POA: Diagnosis not present

## 2024-04-13 DIAGNOSIS — I480 Paroxysmal atrial fibrillation: Secondary | ICD-10-CM

## 2024-04-13 NOTE — Patient Instructions (Addendum)
 Medication Instructions:  Your physician recommends that you continue on your current medications as directed. Please refer to the Current Medication list given to you today.  *If you need a refill on your cardiac medications before your next appointment, please call your pharmacy*  Lab Work: NONE If you have labs (blood work) drawn today and your tests are completely normal, you will receive your results only by: MyChart Message (if you have MyChart) OR A paper copy in the mail If you have any lab test that is abnormal or we need to change your treatment, we will call you to review the results.  Testing/Procedures: NONE  Follow-Up: At [facility], you and your health needs are our priority.  As part of our continuing mission to provide you with exceptional heart care, our providers are all part of one team.  This team includes your primary Cardiologist (physician) and Advanced Practice Providers or APPs (Physician Assistants and Nurse Practitioners) who all work together to provide you with the care you need, when you need it.  Your next appointment:   6 month(s)  Provider:   Ozell Fell, MD   We recommend signing up for the patient portal called MyChart.  Sign up information is provided on this After Visit Summary.  MyChart is used to connect with patients for Virtual Visits (Telemedicine).  Patients are able to view lab/test results, encounter notes, upcoming appointments, etc.  Non-urgent messages can be sent to your provider as well.   To learn more about what you can do with MyChart, go to [email or web address].

## 2024-05-04 ENCOUNTER — Ambulatory Visit
Admission: RE | Admit: 2024-05-04 | Discharge: 2024-05-04 | Disposition: A | Discharge status: Home / Self Care | Source: Ambulatory Visit | Attending: Family Medicine | Admitting: Family Medicine

## 2024-05-04 DIAGNOSIS — Z1231 Encounter for screening mammogram for malignant neoplasm of breast: Secondary | ICD-10-CM | POA: Diagnosis not present

## 2024-05-19 DIAGNOSIS — N3000 Acute cystitis without hematuria: Secondary | ICD-10-CM | POA: Diagnosis not present

## 2024-06-04 ENCOUNTER — Emergency Department (HOSPITAL_COMMUNITY)

## 2024-06-04 ENCOUNTER — Encounter (HOSPITAL_COMMUNITY): Payer: Self-pay

## 2024-06-04 ENCOUNTER — Emergency Department (HOSPITAL_COMMUNITY)
Admission: EM | Admit: 2024-06-04 | Discharge: 2024-06-04 | Disposition: A | Discharge status: Home / Self Care | Attending: Emergency Medicine | Admitting: Emergency Medicine

## 2024-06-04 ENCOUNTER — Other Ambulatory Visit: Payer: Self-pay

## 2024-06-04 DIAGNOSIS — Z87891 Personal history of nicotine dependence: Secondary | ICD-10-CM | POA: Insufficient documentation

## 2024-06-04 DIAGNOSIS — R0789 Other chest pain: Secondary | ICD-10-CM | POA: Diagnosis not present

## 2024-06-04 DIAGNOSIS — I480 Paroxysmal atrial fibrillation: Secondary | ICD-10-CM | POA: Insufficient documentation

## 2024-06-04 DIAGNOSIS — E860 Dehydration: Secondary | ICD-10-CM | POA: Insufficient documentation

## 2024-06-04 DIAGNOSIS — Z79899 Other long term (current) drug therapy: Secondary | ICD-10-CM | POA: Diagnosis not present

## 2024-06-04 DIAGNOSIS — R079 Chest pain, unspecified: Secondary | ICD-10-CM

## 2024-06-04 DIAGNOSIS — Z7901 Long term (current) use of anticoagulants: Secondary | ICD-10-CM | POA: Diagnosis not present

## 2024-06-04 DIAGNOSIS — I10 Essential (primary) hypertension: Secondary | ICD-10-CM | POA: Diagnosis not present

## 2024-06-04 LAB — CBC
HCT: 48.4 % — ABNORMAL HIGH (ref 36.0–46.0)
Hemoglobin: 15.2 g/dL — ABNORMAL HIGH (ref 12.0–15.0)
MCH: 30.2 pg (ref 26.0–34.0)
MCHC: 31.4 g/dL (ref 30.0–36.0)
MCV: 96.2 fL (ref 80.0–100.0)
Platelets: 240 K/uL (ref 150–400)
RBC: 5.03 MIL/uL (ref 3.87–5.11)
RDW: 13.9 % (ref 11.5–15.5)
WBC: 7 K/uL (ref 4.0–10.5)
nRBC: 0 % (ref 0.0–0.2)

## 2024-06-04 LAB — BASIC METABOLIC PANEL WITH GFR
Anion gap: 11 (ref 5–15)
BUN: 20 mg/dL (ref 8–23)
CO2: 27 mmol/L (ref 22–32)
Calcium: 10.4 mg/dL — ABNORMAL HIGH (ref 8.9–10.3)
Chloride: 106 mmol/L (ref 98–111)
Creatinine, Ser: 1.01 mg/dL — ABNORMAL HIGH (ref 0.44–1.00)
GFR, Estimated: 58 mL/min — ABNORMAL LOW (ref >60)
Glucose, Bld: 91 mg/dL (ref 70–99)
Potassium: 4.3 mmol/L (ref 3.5–5.1)
Sodium: 143 mmol/L (ref 135–145)

## 2024-06-04 LAB — TROPONIN T, HIGH SENSITIVITY: Troponin T High Sensitivity: <15 ng/L (ref 0–19)

## 2024-06-04 MED ORDER — SODIUM CHLORIDE 0.9 % IV BOLUS
1000.0000 mL | Freq: Once | INTRAVENOUS | Status: AC
  Administered 2024-06-04: 1000 mL via INTRAVENOUS

## 2024-06-04 NOTE — Discharge Instructions (Addendum)
 While you were in the emergency room, you had blood work done that overall was normal aside from you did appear to be a little bit dehydrated. Your chest x-ray was normal.  Continue take all medications as prescribed.  Please follow-up with your primary care doctor.

## 2024-06-04 NOTE — ED Notes (Signed)
 Urine sample sent to the lab

## 2024-06-04 NOTE — ED Provider Notes (Signed)
 [place] EMERGENCY DEPARTMENT AT [facility] Provider Note  CSN: [id number] Arrival date & time: 06/04/24 9050  Chief Complaint(s) Chest Pain  HPI Sheila Bruce is a 78 y.o. female here today for intermittent fullness on her chest.  Patient says she has been intermittently having the symptoms since she was diagnosed with A-fib about 10 years ago, but recently it seemed worse.  She endorses a sensation of pressure in her chest, that seems to come and go.  Was worse last evening into today.  She occasionally will have headaches with these episodes.  Currently not having symptoms.   Past Medical History Past Medical History:  Diagnosis Date   Anxiety    Habitual alcohol use    Heart murmur    Hx of cardiovascular stress test    a. ETT-Myoview 5/14:  Normal; EF 79%, no ischemia, exercised 10:00.   Hyperlipidemia    Hypertension    PAF (paroxysmal atrial fibrillation) (HCC)    a. Echo 4/14: Mild concentric LVH, EF 60-65%, grade 1 diastolic dysfunction, trivial MR, PASP 29;  b. Eliquis  5 bid   Vertigo    Patient Active Problem List   Diagnosis Date Noted   Atrial fibrillation with RVR (HCC) 12/03/2022   Gastroesophageal reflux disease 12/03/2022   Dysphagia 12/03/2022   Hypokalemia 12/03/2022   Sore throat 05/09/2020   Hematuria 08/05/2014   Former tobacco use 08/05/2014   Habitual alcohol use 08/05/2014   Hypertension    Hyperlipidemia    PAF (paroxysmal atrial fibrillation) (HCC)    Home Medication(s) Prior to Admission medications   Medication Sig Start Date End Date Taking? Authorizing Provider  acetaminophen  (TYLENOL  8 HOUR) 650 MG CR tablet Take 1,300 mg by mouth every 8 (eight) hours as needed for pain.    [provider]  apixaban  (ELIQUIS ) 5 MG TABS tablet Take 1 tablet (5 mg total) by mouth 2 (two) times daily. 01/13/13   Cherlyn Labella, MD  atorvastatin  (LIPITOR) 80 MG tablet Take 1 tablet (80 mg total) by mouth daily. 10/17/23   Lucien Orren SAILOR,  PA-C  Bacillus Coagulans-Inulin (PROBIOTIC) 1-250 BILLION-MG CAPS 1 cap Orally daily    [provider]  BIOTIN PO Take by mouth.    [provider]  Cholecalciferol (VITAMIN D3) 50 MCG (2000 UT) CAPS Take 1 capsule by mouth daily.    [provider]  D-MANNOSE PO Take 1 tablet by mouth daily.    [provider]  diltiazem  (CARDIZEM  CD) 240 MG 24 hr capsule Take 240 mg by mouth daily. 03/09/17   [provider]  estradiol (ESTRACE) 0.1 MG/GM vaginal cream Place 1 Applicatorful vaginally 2 (two) times a week. 03/13/23   [provider]  famotidine  (PEPCID ) 20 MG tablet Take 20 mg by mouth daily. 10/31/21   [provider]  fluticasone (FLONASE) 50 MCG/ACT nasal spray Place 2 sprays into the nose daily.    [provider]  hydrocortisone cream 1 % Apply 1 Application topically 2 (two) times daily.    [provider]  loratadine  (CLARITIN ) 10 MG tablet Take 10 mg by mouth daily.    [provider]  metoprolol  tartrate (LOPRESSOR ) 25 MG tablet Take 1 tablet (25 mg total) by mouth 2 (two) times daily. 10/17/23   Conte, Tessa N, PA-C  Minoxidil 5 % FOAM Apply topically.    [provider]  Multiple Vitamin (MULTIVITAMIN) tablet Take 1 tablet by mouth daily.    [provider]  nicotine   polacrilex (NICORETTE ) 4 MG gum Take 4 mg by mouth as needed for smoking cessation.    [provider]  Omega-3 Fatty Acids (FISH OIL) 500 MG CAPS Take by mouth daily. Per patient taking 1200 mg everyday    [provider]  vitamin E 180 MG (400 UNITS) capsule 1 capsule    [provider]                                                                                                                                    Past Surgical History Past Surgical History:  Procedure Laterality Date   ABDOMINAL HYSTERECTOMY     BREAST EXCISIONAL BIOPSY     CYSTOSCOPY     Laproscopy-Abdominal     TENDON  REPAIR     Left hand   Family History Family History  Problem Relation Age of Onset   Peripheral vascular disease Mother    Heart disease Father 62       PTCA   High Cholesterol Father    Breast cancer Paternal Grandmother    Hypertension Other        Mother & father   Hyperlipidemia Other        Mother & father   Autoimmune disease Other        Mother (?liver) and sister, type unclear   CAD Other        father had old MI on EKG   CAD Other        multiple grandparents    Social History Social History   Tobacco Use   Smoking status: Former    Current packs/day: 0.00    Types: Cigarettes    Quit date: 01/12/2005    Years since quitting: 19.4   Smokeless tobacco: Never   Tobacco comments:    Smoked for 35 yrs - As of 08/2015  chews nicotine  gum  Vaping Use   Vaping status: Never Used  Substance Use Topics   Alcohol use: Not Currently    Alcohol/week: 1.0 standard drink of alcohol    Types: 1 Glasses of wine per week    Comment: Occasional use now - less than one per day during week, Occasional 2-3 on the weekend.   Drug use: No   Allergies Ciprocinonide [fluocinolone], Codeine, Ciprofloxacin, and Doxycycline  Review of Systems Review of Systems  Physical Exam Vital Signs  I have reviewed the triage vital signs BP 132/62   Pulse (!) 49   Temp 98.2 F (36.8 C) (Oral)   Resp 16   Ht 5' (1.524 m)   Wt 65.8 kg   SpO2 97%   BMI 28.32 kg/m   Physical Exam Vitals and nursing note reviewed.  Cardiovascular:     Rate and Rhythm: Normal rate and regular rhythm.     Heart sounds: Normal heart sounds.  Pulmonary:     Effort: Pulmonary effort is normal.  Abdominal:  Palpations: Abdomen is soft.  Neurological:     Mental Status: She is alert.     ED Results and Treatments Labs (all labs ordered are listed, but only abnormal results are displayed) Labs Reviewed  BASIC METABOLIC PANEL WITH GFR - Abnormal; Notable for the following components:       Result Value   Creatinine, Ser 1.01 (*)    Calcium  10.4 (*)    GFR, Estimated 58 (*)    All other components within normal limits  CBC - Abnormal; Notable for the following components:   Hemoglobin 15.2 (*)    HCT 48.4 (*)    All other components within normal limits  TROPONIN T, HIGH SENSITIVITY                                                                                                                          Radiology DG Chest 2 View Result Date: 06/04/2024 CLINICAL DATA:  Chest pain. EXAM: CHEST - 2 VIEW COMPARISON:  10/02/2022. FINDINGS: Bilateral lung fields are clear. Bilateral costophrenic angles are clear. Normal cardio-mediastinal silhouette. No acute osseous abnormalities. The soft tissues are within normal limits. IMPRESSION: No active cardiopulmonary disease. Electronically Signed   By: Ree Molt M.D.   On: 06/04/2024 10:18    Pertinent labs & imaging results that were available during my care of the patient were reviewed by me and considered in my medical decision making (see MDM for details).  Medications Ordered in ED Medications  sodium chloride  0.9 % bolus 1,000 mL (has no administration in time range)                                                                                                                                     Procedures Procedures  (including critical care time)  Medical Decision Making / ED Course   This patient presents to the ED for concern of chest pressure, this involves an extensive number of treatment options, and is a complaint that carries with it a high risk of complications and morbidity.  The differential diagnosis includes ACS, chronic chest pain, pleurisy, musculoskeletal pain, gastritis, GERD, less likely PE.  MDM: Troponin testing ordered, chest x-ray ordered.  Patient is not experiencing chest pain at this time, does not appear to be in any acute distress.  Normal heart sounds, normal lung sounds.  Soft abdomen.   Patient compliant with her  Eliquis , diltiazem .  Normal sinus rhythm on EKG.  Reassessment 12:50 PM-troponin negative.  Patient a little bit hemoconcentrated, mild AKI, does not require admission.  Will provide her with some fluids here in the ED.  Will have her follow-up with her PCP.   Additional history obtained: -Additional history obtained from husband at bedside -External records from outside source obtained and reviewed including: Chart review including previous notes, labs, imaging, consultation notes   Lab Tests: -I ordered, reviewed, and interpreted labs.   The pertinent results include:   Labs Reviewed  BASIC METABOLIC PANEL WITH GFR - Abnormal; Notable for the following components:      Result Value   Creatinine, Ser 1.01 (*)    Calcium  10.4 (*)    GFR, Estimated 58 (*)    All other components within normal limits  CBC - Abnormal; Notable for the following components:   Hemoglobin 15.2 (*)    HCT 48.4 (*)    All other components within normal limits  TROPONIN T, HIGH SENSITIVITY      EKG   EKG Interpretation Date/Time:  Thursday June 04 2024 09:57:24 EDT Ventricular Rate:  53 PR Interval:  160 QRS Duration:  120 QT Interval:  441 QTC Calculation: 414 R Axis:   27  Text Interpretation: Sinus rhythm Nonspecific intraventricular conduction delay Baseline wander in lead(s) V1 No significant change since prior 1/25 Confirmed by Towana Sharper [phone]) on 06/04/2024 10:10:19 AM         Imaging Studies ordered: I ordered imaging studies including chest x-ray I independently visualized and interpreted imaging. I agree with the radiologist interpretation   Medicines ordered and prescription drug management: Meds ordered this encounter  Medications   sodium chloride  0.9 % bolus 1,000 mL    -I have reviewed the patients home medicines and have made adjustments as needed    Cardiac Monitoring: The patient was maintained on a cardiac monitor.  I personally  viewed and interpreted the cardiac monitored which showed an underlying rhythm of: Normal sinus rhythm  Social Determinants of Health:  Factors impacting patients care include: Lack of access to primary care   Reevaluation: After the interventions noted above, I reevaluated the patient and found that they have :improved  Co morbidities that complicate the patient evaluation  Past Medical History:  Diagnosis Date   Anxiety    Habitual alcohol use    Heart murmur    Hx of cardiovascular stress test    a. ETT-Myoview 5/14:  Normal; EF 79%, no ischemia, exercised 10:00.   Hyperlipidemia    Hypertension    PAF (paroxysmal atrial fibrillation) (HCC)    a. Echo 4/14: Mild concentric LVH, EF 60-65%, grade 1 diastolic dysfunction, trivial MR, PASP 29;  b. Eliquis  5 bid   Vertigo       Dispostion: I considered admission for this patient, however she is appropriate for discharge.     Final Clinical Impression(s) / ED Diagnoses Final diagnoses:  Nonspecific chest pain  Dehydration     @PCDICTATION @    Mannie Pac T, DO 06/04/24 1253

## 2024-06-04 NOTE — ED Notes (Signed)
 Pt A&O, no c/o chest pain at this time. HR is 47-52 bpm, pt states she typically stays around 50 bpm

## 2024-06-04 NOTE — ED Triage Notes (Signed)
 Pt presents to ED from home C/O chest pressure, SOB X 3 days. Also reports intermittent headache, none currently.

## 2024-06-09 DIAGNOSIS — N390 Urinary tract infection, site not specified: Secondary | ICD-10-CM | POA: Diagnosis not present

## 2024-06-12 DIAGNOSIS — N3020 Other chronic cystitis without hematuria: Secondary | ICD-10-CM | POA: Diagnosis not present

## 2024-07-22 DIAGNOSIS — I10 Essential (primary) hypertension: Secondary | ICD-10-CM | POA: Diagnosis not present

## 2024-07-22 DIAGNOSIS — M549 Dorsalgia, unspecified: Secondary | ICD-10-CM | POA: Diagnosis not present

## 2024-07-22 DIAGNOSIS — M542 Cervicalgia: Secondary | ICD-10-CM | POA: Diagnosis not present

## 2024-07-22 DIAGNOSIS — K219 Gastro-esophageal reflux disease without esophagitis: Secondary | ICD-10-CM | POA: Diagnosis not present

## 2024-07-22 DIAGNOSIS — I2510 Atherosclerotic heart disease of native coronary artery without angina pectoris: Secondary | ICD-10-CM | POA: Diagnosis not present

## 2024-07-22 DIAGNOSIS — R7303 Prediabetes: Secondary | ICD-10-CM | POA: Diagnosis not present

## 2024-07-22 DIAGNOSIS — E7800 Pure hypercholesterolemia, unspecified: Secondary | ICD-10-CM | POA: Diagnosis not present

## 2024-07-22 DIAGNOSIS — I480 Paroxysmal atrial fibrillation: Secondary | ICD-10-CM | POA: Diagnosis not present

## 2024-07-22 DIAGNOSIS — Z23 Encounter for immunization: Secondary | ICD-10-CM | POA: Diagnosis not present

## 2024-07-29 ENCOUNTER — Telehealth: Payer: Self-pay | Admitting: Physician Assistant

## 2024-07-29 NOTE — Telephone Encounter (Signed)
 Pt called in stating she had labs done recently at her PCP at [facility] [employer]. They want to add a new cholesterol med but she wants a second opinion if its necessary to add something. She states she has a photo but does not know how to send via mychart.   Dr. Olam Pinal - fax:  [phone]

## 2024-07-29 NOTE — Telephone Encounter (Signed)
 Per pt report - her LDL was 79 and PCP wants her to be started on zetia 10 mg along with atorvastatin  she is already taking.  Advised typically goal for LDL is 70 or less and zetia is a widely used medication to assist in her reaching this goal.  Pt states understanding and was appreciative of the call and information.

## 2024-10-06 ENCOUNTER — Ambulatory Visit: Admitting: Licensed Clinical Social Worker

## 2024-11-18 ENCOUNTER — Ambulatory Visit: Admitting: Licensed Clinical Social Worker
